# Patient Record
Sex: Female | Born: 1945
Health system: Southern US, Community
[De-identification: ages and names within clinical notes are randomized; demographics above are authoritative.]

## PROBLEM LIST (undated history)

## (undated) DIAGNOSIS — E559 Vitamin D deficiency, unspecified: Secondary | ICD-10-CM

## (undated) DIAGNOSIS — I10 Essential (primary) hypertension: Secondary | ICD-10-CM

## (undated) DIAGNOSIS — M1990 Unspecified osteoarthritis, unspecified site: Secondary | ICD-10-CM

## (undated) DIAGNOSIS — G20 Parkinson's disease: Secondary | ICD-10-CM

## (undated) DIAGNOSIS — F419 Anxiety disorder, unspecified: Secondary | ICD-10-CM

## (undated) DIAGNOSIS — D649 Anemia, unspecified: Secondary | ICD-10-CM

## (undated) DIAGNOSIS — I82403 Acute embolism and thrombosis of unspecified deep veins of lower extremity, bilateral: Secondary | ICD-10-CM

## (undated) DIAGNOSIS — G20A1 Parkinson's disease without dyskinesia, without mention of fluctuations: Secondary | ICD-10-CM

## (undated) HISTORY — PX: REFRACTIVE SURGERY: SHX103

## (undated) HISTORY — DX: Anxiety disorder, unspecified: F41.9

## (undated) HISTORY — PX: DILATION AND CURETTAGE OF UTERUS: SHX78

## (undated) HISTORY — PX: BREAST BIOPSY: SHX20

## (undated) HISTORY — DX: Vitamin D deficiency, unspecified: E55.9

## (undated) HISTORY — PX: OTHER SURGICAL HISTORY: SHX169

## (undated) HISTORY — DX: Parkinson's disease without dyskinesia, without mention of fluctuations: G20.A1

## (undated) HISTORY — DX: Parkinson's disease: G20

## (undated) HISTORY — DX: Essential (primary) hypertension: I10

---

## 2006-01-23 ENCOUNTER — Ambulatory Visit: Payer: Self-pay | Admitting: Family Medicine

## 2006-02-20 ENCOUNTER — Ambulatory Visit: Payer: Self-pay | Admitting: Family Medicine

## 2006-05-31 ENCOUNTER — Ambulatory Visit: Payer: Self-pay | Admitting: Family Medicine

## 2007-01-28 ENCOUNTER — Telehealth (INDEPENDENT_AMBULATORY_CARE_PROVIDER_SITE_OTHER): Payer: Self-pay | Admitting: *Deleted

## 2007-01-31 ENCOUNTER — Ambulatory Visit: Payer: Self-pay | Admitting: Family Medicine

## 2007-01-31 DIAGNOSIS — I10 Essential (primary) hypertension: Secondary | ICD-10-CM

## 2007-01-31 DIAGNOSIS — F410 Panic disorder [episodic paroxysmal anxiety] without agoraphobia: Secondary | ICD-10-CM

## 2007-01-31 DIAGNOSIS — F411 Generalized anxiety disorder: Secondary | ICD-10-CM | POA: Insufficient documentation

## 2007-01-31 HISTORY — DX: Panic disorder (episodic paroxysmal anxiety): F41.0

## 2007-01-31 HISTORY — DX: Essential (primary) hypertension: I10

## 2007-02-20 ENCOUNTER — Ambulatory Visit: Payer: Self-pay | Admitting: Family Medicine

## 2007-04-09 ENCOUNTER — Ambulatory Visit: Payer: Self-pay | Admitting: Gastroenterology

## 2008-03-04 ENCOUNTER — Ambulatory Visit: Payer: Self-pay | Admitting: Family Medicine

## 2008-04-07 ENCOUNTER — Ambulatory Visit: Payer: Self-pay | Admitting: Family Medicine

## 2008-04-07 DIAGNOSIS — R259 Unspecified abnormal involuntary movements: Secondary | ICD-10-CM | POA: Insufficient documentation

## 2008-04-09 ENCOUNTER — Encounter (INDEPENDENT_AMBULATORY_CARE_PROVIDER_SITE_OTHER): Payer: Self-pay | Admitting: *Deleted

## 2008-04-09 LAB — CONVERTED CEMR LAB
ALT: 18 units/L (ref 0–35)
Basophils Absolute: 0.1 10*3/uL (ref 0.0–0.1)
Basophils Relative: 1.8 % (ref 0.0–3.0)
CO2: 31 meq/L (ref 19–32)
Calcium: 9.6 mg/dL (ref 8.4–10.5)
Cholesterol: 188 mg/dL (ref 0–200)
Creatinine, Ser: 0.9 mg/dL (ref 0.4–1.2)
GFR calc Af Amer: 82 mL/min
Glucose, Bld: 106 mg/dL — ABNORMAL HIGH (ref 70–99)
HCT: 38 % (ref 36.0–46.0)
Hemoglobin: 13.1 g/dL (ref 12.0–15.0)
LDL Cholesterol: 116 mg/dL — ABNORMAL HIGH (ref 0–99)
Lymphocytes Relative: 20.3 % (ref 12.0–46.0)
MCHC: 34.4 g/dL (ref 30.0–36.0)
Monocytes Absolute: 0.3 10*3/uL (ref 0.1–1.0)
Monocytes Relative: 5.5 % (ref 3.0–12.0)
Neutro Abs: 3.6 10*3/uL (ref 1.4–7.7)
RBC: 4.19 M/uL (ref 3.87–5.11)
TSH: 1.27 microintl units/mL (ref 0.35–5.50)
Total Protein: 7.3 g/dL (ref 6.0–8.3)
Triglycerides: 80 mg/dL (ref 0–149)

## 2008-05-12 ENCOUNTER — Encounter: Payer: Self-pay | Admitting: Internal Medicine

## 2008-06-18 ENCOUNTER — Encounter: Payer: Self-pay | Admitting: Family Medicine

## 2008-07-08 ENCOUNTER — Ambulatory Visit: Payer: Self-pay | Admitting: Family Medicine

## 2008-07-08 DIAGNOSIS — G20 Parkinson's disease: Secondary | ICD-10-CM | POA: Insufficient documentation

## 2008-07-13 ENCOUNTER — Encounter (INDEPENDENT_AMBULATORY_CARE_PROVIDER_SITE_OTHER): Payer: Self-pay | Admitting: *Deleted

## 2008-07-21 ENCOUNTER — Encounter: Payer: Self-pay | Admitting: Family Medicine

## 2008-07-23 ENCOUNTER — Telehealth (INDEPENDENT_AMBULATORY_CARE_PROVIDER_SITE_OTHER): Payer: Self-pay | Admitting: *Deleted

## 2008-09-23 ENCOUNTER — Ambulatory Visit: Payer: Self-pay | Admitting: Family Medicine

## 2008-12-24 ENCOUNTER — Ambulatory Visit: Payer: Self-pay | Admitting: Family Medicine

## 2009-12-30 ENCOUNTER — Ambulatory Visit: Payer: Self-pay | Admitting: Family Medicine

## 2009-12-30 ENCOUNTER — Encounter: Payer: Self-pay | Admitting: Family Medicine

## 2010-01-03 ENCOUNTER — Ambulatory Visit: Payer: Self-pay | Admitting: Family Medicine

## 2010-01-03 LAB — CONVERTED CEMR LAB
Albumin: 4 g/dL (ref 3.5–5.2)
Alkaline Phosphatase: 78 units/L (ref 39–117)
Basophils Absolute: 0 10*3/uL (ref 0.0–0.1)
Bilirubin, Direct: 0.1 mg/dL (ref 0.0–0.3)
Calcium: 9.2 mg/dL (ref 8.4–10.5)
GFR calc non Af Amer: 53.46 mL/min (ref >60)
HCT: 37.1 % (ref 36.0–46.0)
HDL: 64 mg/dL (ref >39.00)
LDL Cholesterol: 111 mg/dL — ABNORMAL HIGH (ref 0–99)
Lymphs Abs: 1.4 10*3/uL (ref 0.7–4.0)
Monocytes Relative: 7.3 % (ref 3.0–12.0)
Platelets: 152 10*3/uL (ref 150.0–400.0)
RDW: 13.3 % (ref 11.5–14.6)
Sodium: 138 meq/L (ref 135–145)
TSH: 1.29 microintl units/mL (ref 0.35–5.50)
Total Bilirubin: 0.7 mg/dL (ref 0.3–1.2)
Total CHOL/HDL Ratio: 3
VLDL: 12 mg/dL (ref 0.0–40.0)

## 2010-01-31 ENCOUNTER — Ambulatory Visit: Payer: Self-pay | Admitting: Family Medicine

## 2010-01-31 DIAGNOSIS — M7660 Achilles tendinitis, unspecified leg: Secondary | ICD-10-CM | POA: Insufficient documentation

## 2010-05-05 NOTE — Assessment & Plan Note (Signed)
Summary: CPX///SPH   Vital Signs:  Patient profile:   65 year old female Height:      66 inches (167.64 cm) Weight:      191 pounds (86.82 kg) BMI:     30.94 Temp:     97.6 degrees F (36.44 degrees C) oral BP sitting:   114 / 62  (left arm) Cuff size:   regular  Vitals Entered By: Lucious Groves CMA (December 30, 2009 3:41 PM) CC: CPX--no pap./kb Is Patient Diabetic? No Pain Assessment Patient in pain? no      Comments Patient notes that she is not taking Naproxen, Mirapex 0.125, or Selegiline./kb   Current Medications (verified): 1)  Paxil Cr 12.5 Mg Tb24 (Paroxetine Hcl) .Marland Kitchen.. 1 By Mouth Once Daily*office Visit Due Now ** 2)  Alprazolam 0.25 Mg Tabs (Alprazolam) .... Take One Tablet Two Times A Day As Needed For Anxiety 3)  Aspirin 81 Mg Tbec (Aspirin) .... Take One Tablet Daily 4)  Mirapex 0.5 Mg Tabs (Pramipexole Dihydrochloride) .... 2 By Mouth Tid  Allergies (verified): 1)  ! Codeine   Preventive Screening-Counseling & Management  Alcohol-Tobacco     Alcohol drinks/day: <1     Smoking Status: never  Caffeine-Diet-Exercise     Does Patient Exercise: yes      Drug Use:  never.    History of Present Illness: 65 yo woman here for CPE.  since last visit, pt's brother passed, sister had a stroke recently.  knows she needs an anti-depressant but would prefer to change meds due to wt gain.  has gained 40 lbs since christmas.  currently taking a pill every other day.  parkinson's meds can also cause wt gain but 'i need those'.  still walking daily and doing yoga.  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  (forgot form)  health maintainence- has appt upcoming for pap and mammogram.  going to schedule appt at [facility] for colonoscopy.   Past History:  Past Medical History: Last updated: 07/08/2008 Anxiety Parkinson's Dx'd 2/10  Past Surgical History: Last updated: 04/07/2008 none reported  Family History: Last  updated: 12/30/2009 MI at 73: father brother with bladder cancer dx'd 2010 brother w/ brain cancer dx'd 2010 sister w/ CVA  Social History: Last updated: 12/30/2009 Retired Never Smoked Alcohol use-yes Drug use-no Regular exercise-yes, walking, yoga, Systems analyst for parkinson's  Family History: MI at 36: father brother with bladder cancer dx'd 2010 brother w/ brain cancer dx'd 2010 sister w/ CVA  Social History: Retired Never Smoked Alcohol use-yes Drug use-no Regular exercise-yes, walking, yoga, Systems analyst for parkinson's Drug Use:  never  Review of Systems       The patient complains of weight gain and depression.  The patient denies anorexia, fever, weight loss, vision loss, decreased hearing, hoarseness, chest pain, syncope, dyspnea on exertion, peripheral edema, prolonged cough, headaches, abdominal pain, melena, hematochezia, severe indigestion/heartburn, hematuria, suspicious skin lesions, abnormal bleeding, enlarged lymph nodes, and breast masses.    Physical Exam  General:  Well-developed,well-nourished,in no acute distress; alert,appropriate and cooperative throughout examination Head:  Normocephalic and atraumatic without obvious abnormalities. No apparent alopecia or balding. Eyes:  No corneal or conjunctival inflammation noted. EOMI. Perrla. Funduscopic exam benign, without hemorrhages, exudates or papilledema. Vision grossly normal. Ears:  External ear exam shows no significant lesions or deformities.  Otoscopic examination reveals clear canals, tympanic membranes are intact bilaterally without bulging, retraction, inflammation or discharge. Hearing is grossly normal bilaterally. Nose:  External nasal examination shows no deformity or inflammation. Nasal mucosa are pink and moist without lesions or exudates. Mouth:  Oral mucosa and oropharynx without lesions or exudates.  Teeth in good repair. Neck:  No deformities, masses, or tenderness  noted. Breasts:  deferred to gyn Lungs:  Normal respiratory effort, chest expands symmetrically. Lungs are clear to auscultation, no crackles or wheezes. Heart:  Normal rate and regular rhythm. S1 and S2 normal without gallop, murmur, click, rub or other extra sounds. Abdomen:  Bowel sounds positive,abdomen soft and non-tender without masses, organomegaly or hernias noted. Genitalia:  deferred to gyn Pulses:  +2 carotid, radial, DP Extremities:  no C/C/E Neurologic:  No cranial nerve deficits noted. Station and gait are normal. Plantar reflexes are down-going bilaterally. DTRs are brisk but symmetrical throughout.  + resting tremor of R hand- almost pill rolling in nature.  muscle strength 5/5 throughout w/ exception of 4/5 bicep strength on R.  + cogwheeling, some rigidity Skin:  Intact without suspicious lesions or rashes Cervical Nodes:  No lymphadenopathy noted Axillary Nodes:  No palpable lymphadenopathy Psych:  Cognition and judgment appear intact. Alert and cooperative with normal attention span and concentration. No apparent delusions, illusions, hallucinations   Impression & Recommendations:  Problem # 1:  HEALTHY ADULT FEMALE (ICD-V70.0) Assessment Unchanged  pt's PE WNL w/ exception of parkinson's sxs.  has plans for colonoscopy and pap/mammo.  needs to return for fasting labs.  anticipatory guidance provided on healthy diet and regular exercise..  Orders: EKG w/ Interpretation (93000)  Problem # 2:  ANXIETY (ICD-300.00) Assessment: Unchanged given pt's recent wt gain and desire to switch paxil will start Celexa.  will follow mood closely since switching med. Her updated medication list for this problem includes:    Citalopram Hydrobromide 20 Mg Tabs (Citalopram hydrobromide) .Marland Kitchen... Take one tablet by mouth daily    Alprazolam 0.25 Mg Tabs (Alprazolam) .Marland Kitchen... Take one tablet two times a day as needed for anxiety  Complete Medication List: 1)  Citalopram Hydrobromide 20 Mg  Tabs (Citalopram hydrobromide) .... Take one tablet by mouth daily 2)  Alprazolam 0.25 Mg Tabs (Alprazolam) .... Take one tablet two times a day as needed for anxiety 3)  Aspirin 81 Mg Tbec (Aspirin) .... Take one tablet daily 4)  Mirapex 0.5 Mg Tabs (Pramipexole dihydrochloride) .... 2 by mouth tid  Other Orders: Tdap => 42yrs IM [phone]) Admin 1st Vaccine (99833) Admin 1st Vaccine (82505) Flu Vaccine 47yrs + [phone])  Immunizations Administered:  Tetanus Vaccine:    Vaccine Type: Tdap    Site: left deltoid    Mfr: [employer]Smith[employer]    Dose: 0.5 ml    Route: IM    Given by: Lucious Groves CMA    Exp. Date: 01/21/2012    Lot #: [id number]    VIS given: 02/19/08 version given December 30, 2009.  Patient Instructions: 1)  Please schedule a follow-up appointment in 1 month to follow up med switch. 2)  Schedule a lab visit at your convenience- do not eat before this appt 3)   BMP prior to visit, ICD-9: V70 4)  Hepatic Panel prior to visit ICD-9: V70 5)  Lipid panel prior to visit ICD-9 : V70 6)  TSH prior to visit ICD-9 : V70 7)  CBC w/ Diff prior to visit ICD-9 : V70 8)  Keep up the good work on your exercise 9)  Bring the form for the personal trainer 10)  We'll notify you of your lab results 11)  STOP  the Paxil.  START the Citalopram daily- if it causes sleepiness, take it at night 12)  Call with any questions or concerns 13)  You look great! Prescriptions: ALPRAZOLAM 0.25 MG TABS (ALPRAZOLAM) take one tablet two times a day as needed for anxiety  #60 x 3   Entered and Authorized by:   [name] MD   Signed by:   [name] MD on 12/30/2009   Method used:   Print then Give to Patient   RxID:   [id number] CITALOPRAM HYDROBROMIDE 20 MG TABS (CITALOPRAM HYDROBROMIDE) take one tablet by mouth daily  #30 x 3   Entered and Authorized by:   [name] MD   Signed by:   [name] MD on 12/30/2009   Method used:   Electronically to        [facility] (retail)       [address]       [facility]       [place], [place]  [address]       Ph: [phone]       Fax: [phone]   RxID:   [id number]  ]    Flu Vaccine Consent Questions     Do you have a history of severe allergic reactions to this vaccine? no    Any prior history of allergic reactions to egg and/or gelatin? no    Do you have a sensitivity to the preservative Thimersol? no    Do you have a past history of Guillan-Barre Syndrome? no    Do you currently have an acute febrile illness? no    Have you ever had a severe reaction to latex? no    Vaccine information given and explained to patient? yes    Are you currently pregnant? no    Lot Number:[id number]   Exp Date:10/01/2010   Site Given  Right Deltoid IMlbflu

## 2010-05-05 NOTE — Assessment & Plan Note (Signed)
Summary: ACHILLES TENDONITIS & SWELLING RT ANKLE/NP/LP   Vital Signs:  Patient profile:   65 year old female Temp:     97.4 degrees F Pulse rate:   58 / minute BP sitting:   194 / 76  History of Present Illness: 65 yo F here for right achilles pain  Patient reports pain within right achilles for the past month She typically walks around her neighborhood for exercise about 3 miles a day About 1 month ago she slipped off a little step and felt a pull within her right achilles Some swelling but no bruising. Has had full range of motion of right ankle. Some better but still with pain here limiting her activities. Worse after stairs and first thing in the morning Has been icing the area. Trying to stay active and is meeting with physical therapist/personal trainer for parkinsons exercise tomorrow.   Problems Prior to Update: 1)  Achilles Tendinitis  (ICD-726.71) 2)  Need Prophylactic Vaccination&inoculation Flu  (ICD-V04.81) 3)  Parkinson's Disease  (ICD-332.0) 4)  Special Screening For Malignant Neoplasms Colon  (ICD-V76.51) 5)  Tremor, Right Hand  (ICD-781.0) 6)  Healthy Adult Female  (ICD-V70.0) 7)  Elevated Blood Pressure Without Diagnosis of Hypertension  (ICD-796.2) 8)  Anxiety  (ICD-300.00)  Medications Prior to Update: 1)  Citalopram Hydrobromide 20 Mg Tabs (Citalopram Hydrobromide) .... Take One Tablet By Mouth Daily 2)  Alprazolam 0.25 Mg Tabs (Alprazolam) .... Take One Tablet Two Times A Day As Needed For Anxiety 3)  Aspirin 81 Mg Tbec (Aspirin) .... Take One Tablet Daily 4)  Mirapex Er 1.5 Mg Xr24h-Tab (Pramipexole Dihydrochloride) .... 2 Tabs Daily- Per Neuro  Allergies (verified): 1)  ! Codeine  Physical Exam  General:  Well-developed,well-nourished,in no acute distress; alert,appropriate and cooperative throughout examination Msk:  R foot/ankle: Mild swelling, thicker AT compared to left side. TTP 2 cm proximal to achilles insertion on calcaneus No bruising  or other deformity. FROM of ankle. Negative thompson's test. Able to stand on toes with right foot only. NVI distally   Impression & Recommendations:  Problem # 1:  ACHILLES TENDINITIS (ICD-726.71) Assessment Deteriorated History and exam consistent with achilles strain/tendinopathy but tendon is intact.  Shown home exercise program - specifically important she focus on step exercise demonstrated and do this every day.  ice bucket or wrap 15 minutes at a time 3-4 times a day especially at end of day.  Given comforthotics with heel lift.  Also provided with another pair of heel lifts to put in dress shoes.  Try to avoid uneven ground, hills, and consider wearing tennis shoes with the insert when walking around house.  Does not need boot or immobilization at this time - did tell her to let her therapist/personal trainer know about this injury so she can exercise other muscle groups while she specifically rehabs her AT on this side (not sure the extent to which this person is trained in PT but advised her if he/she needs an order to do rehab on this, would be happy to order this in addition to what she is already going to be doing).  Provided with a sample of voltaren gel to try 3-4 times a day as needed also - will call in a prescription if she finds this helpful.  Nitro patches an option if not improving after 6 weeks of therapy.  Orders: Sports Insoles [phone])  Complete Medication List: 1)  Citalopram Hydrobromide 20 Mg Tabs (Citalopram hydrobromide) .... Take one tablet by mouth daily 2)  Alprazolam 0.25 Mg Tabs (Alprazolam) .... Take one tablet two times a day as needed for anxiety 3)  Aspirin 81 Mg Tbec (Aspirin) .... Take one tablet daily 4)  Mirapex Er 1.5 Mg Xr24h-tab (Pramipexole dihydrochloride) .... 2 tabs daily- per neuro  Patient Instructions: 1)  You have achilles tendinopathy (inflammation/degeneration of your achilles) which is causing your pain. 2)  Try tylenol or aleve as  needed for pain 3)  Lowering/raise on a step exercises 3 x 15 once or twice a day - two feet first then one (start with 3 x 6 if necessary) 4)  Can add heel walks, toe walks forward and backward as well 5)  Ice bucket 10-15 minutes at end of day - can ice 3-4 times a day. 6)  Avoid uneven ground, hills. 7)  Avoid barefoot walking and sandals when possible. 8)  Heel lifts will help unload the tendon while it heals. 9)  Use the orthotics with heel lift in your tennis shoes (take the insole out of it that is already in there. 10)  Custom orthotics may be helpful if you get benefit with these temporary ones. 11)  Physical therapy is an option but usually you can do these exercises all by yourself. 12)  Let your therapist know you have an achilles problem when you go for your parkinsons rehab (they may be able to help rehab this problem while you are there too!). 13)  Follow up with me in 6 weeks for a recheck.   Orders Added: 1)  New Patient Level III [99203] 2)  Sports Insoles [L3510]  Appended Document: ACHILLES TENDONITIS & SWELLING RT ANKLE/NP/LP patient given a sample of voltaren gel on 10/31 1 box of lot number [id number], exp date 08/2012

## 2010-05-05 NOTE — Assessment & Plan Note (Signed)
Summary: ONE MONTH OV//PH   Vital Signs:  Patient profile:   65 year old female Weight:      191 pounds Pulse rate:   70 / minute BP sitting:   122 / 78  (left arm)  Vitals Entered By: Doristine Devoid CMA (January 31, 2010 9:34 AM) CC: f/u on meds    History of Present Illness: 65 yo woman here today for f/u on recent Celexa start.  has been taking the Citalopram at night.  feels anxiety has improved since stopping the paxil.  R achilles pain- started 3 weeks ago, pain w/ walking, swollen.  still trying to exercise despite pain b/c she knows it's important for her Parkinson's tx.  Current Medications (verified): 1)  Citalopram Hydrobromide 20 Mg Tabs (Citalopram Hydrobromide) .... Take One Tablet By Mouth Daily 2)  Alprazolam 0.25 Mg Tabs (Alprazolam) .... Take One Tablet Two Times A Day As Needed For Anxiety 3)  Aspirin 81 Mg Tbec (Aspirin) .... Take One Tablet Daily 4)  Mirapex Er 1.5 Mg Xr24h-Tab (Pramipexole Dihydrochloride) .... 2 Tabs Daily- Per Neuro  Allergies (verified): 1)  ! Codeine  Review of Systems      See HPI  Physical Exam  General:  Well-developed,well-nourished,in no acute distress; alert,appropriate and cooperative throughout examination Msk:  R achilles swollen, painful.  good strength w/ dorsiflexion Pulses:  +2 DP/PT   Impression & Recommendations:  Problem # 1:  ANXIETY (ICD-300.00) Assessment Improved pt feels sxs are improved since switching med. Her updated medication list for this problem includes:    Citalopram Hydrobromide 20 Mg Tabs (Citalopram hydrobromide) .Marland Kitchen... Take one tablet by mouth daily    Alprazolam 0.25 Mg Tabs (Alprazolam) .Marland Kitchen... Take one tablet two times a day as needed for anxiety  Problem # 2:  ACHILLES TENDINITIS (ICD-726.71) Assessment: New given amount of pain and swelling would like sports med to see pt to eval for partial tear.  part of pt's Parkinson's tx is regular exercise and she is having a very difficult time doing  this w/ her current pain.  may need to temporarily stop exercising to allow healing but will defer to sports med Orders: Sports Medicine (Sports Med)  Complete Medication List: 1)  Citalopram Hydrobromide 20 Mg Tabs (Citalopram hydrobromide) .... Take one tablet by mouth daily 2)  Alprazolam 0.25 Mg Tabs (Alprazolam) .... Take one tablet two times a day as needed for anxiety 3)  Aspirin 81 Mg Tbec (Aspirin) .... Take one tablet daily 4)  Mirapex Er 1.5 Mg Xr24h-tab (Pramipexole dihydrochloride) .... 2 tabs daily- per neuro   Orders Added: 1)  Sports Medicine [Sports Med] 2)  Est. Patient Level III [16109]    History of Present Illness: 65 yo woman here today for f/u on recent Celexa start.  has been taking the Citalopram at night.  feels anxiety has improved since stopping the paxil.  R achilles pain- started 3 weeks ago, pain w/ walking, swollen.  still trying to exercise despite pain b/c she knows it's important for her Parkinson's tx.

## 2010-06-13 ENCOUNTER — Encounter: Payer: Self-pay | Admitting: *Deleted

## 2010-08-19 NOTE — Assessment & Plan Note (Signed)
[facility]                                 ON-CALL NOTE   [name], Carly                    MRN:          [id number]  DATE:05/26/2006                            DOB:          October 22, 1945    The phone call came from Demetrios Loll, who did not say who he was; with her  probably a significant other.  Phone number [phone].  Phone call was at  7:30 a.m. on May 26, 2006.   Carly [name] has come down with a bit of a sore throat and she feels  a little bit achy, has a temperature to 99.5 and Mr. Edwyna Shell called asking  for a prescription for her sore throat.   PLAN:  I explained that we did not give prescriptions over the phone,  that most sore throats were viral.  It did not sound like she had flu  symptoms, was not having shortness of breath.  I offered her an  appointment and he declined.  I did discuss supportive treatment with  analgesics and told him to call back if she changed her mind and wanted  to be seen.     Karie Schwalbe, MD  Electronically Signed    RIL/MedQ  DD: 05/26/2006  DT: 05/26/2006  Job #: [phone] [id number]   cc:   Leanne Chang, M.D.

## 2010-08-22 ENCOUNTER — Other Ambulatory Visit: Payer: Self-pay | Admitting: Family Medicine

## 2010-08-22 NOTE — Telephone Encounter (Signed)
Per [facility] pt is UTD, sent refill.

## 2011-04-12 ENCOUNTER — Encounter: Payer: Self-pay | Admitting: Family Medicine

## 2011-04-12 ENCOUNTER — Ambulatory Visit (INDEPENDENT_AMBULATORY_CARE_PROVIDER_SITE_OTHER): Payer: Self-pay | Admitting: Family Medicine

## 2011-04-12 VITALS — BP 130/75 | HR 66 | Temp 97.9°F | Ht 66.0 in | Wt 190.0 lb

## 2011-04-12 DIAGNOSIS — J40 Bronchitis, not specified as acute or chronic: Secondary | ICD-10-CM | POA: Insufficient documentation

## 2011-04-12 HISTORY — DX: Bronchitis, not specified as acute or chronic: J40

## 2011-04-12 MED ORDER — AMOXICILLIN 500 MG PO CAPS: 500.0000 mg | ORAL_CAPSULE | Freq: Two times a day (BID) | ORAL | Status: AC

## 2011-04-12 MED ORDER — ALPRAZOLAM 0.25 MG PO TABS: 0.2500 mg | ORAL_TABLET | Freq: Two times a day (BID) | ORAL | Status: DC | PRN

## 2011-04-12 MED ORDER — BENZONATATE 200 MG PO CAPS: 200.0000 mg | ORAL_CAPSULE | Freq: Three times a day (TID) | ORAL | Status: AC | PRN

## 2011-04-12 NOTE — Assessment & Plan Note (Signed)
Pt's sxs and PE consistent w/ infxn.  Start abx.  Cough meds prn.  Reviewed supportive care and red flags that should prompt return.  Pt expressed understanding and is in agreement w/ plan.  

## 2011-04-12 NOTE — Patient Instructions (Signed)
This is a bronchitis Start the Amoxicillin- take w/ food Add Mucinex Use the cough pills as needed Drink plenty of fluids REST! Hang in there! Happy New Year!

## 2011-04-12 NOTE — Progress Notes (Signed)
  Subjective:    Patient ID: Carly [name], female    DOB: 09-26-1945, 66 y.o.   MRN: [id number]  HPI Cough- sxs started 9 days ago.  Productive, 'almost choking'.  + nasal congestion, sinus pressure.  No ear pain, fever.  + sick contacts.   Review of Systems For ROS see HPI     Objective:   Physical Exam  Vitals reviewed. Constitutional: She appears well-developed and well-nourished. No distress.  HENT:  Head: Normocephalic and atraumatic.       TMs normal bilaterally Mild nasal congestion Throat w/out erythema, edema, or exudate  Eyes: Conjunctivae and EOM are normal. Pupils are equal, round, and reactive to light.  Neck: Normal range of motion. Neck supple.  Cardiovascular: Normal rate, regular rhythm, normal heart sounds and intact distal pulses.   No murmur heard. Pulmonary/Chest: Effort normal and breath sounds normal. No respiratory distress. She has no wheezes.       + hacking cough  Lymphadenopathy:    She has no cervical adenopathy.          Assessment & Plan:

## 2012-05-10 ENCOUNTER — Encounter: Payer: Self-pay | Admitting: Family Medicine

## 2012-05-10 ENCOUNTER — Ambulatory Visit (INDEPENDENT_AMBULATORY_CARE_PROVIDER_SITE_OTHER): Payer: BC Managed Care – PPO | Admitting: Family Medicine

## 2012-05-10 VITALS — BP 150/90 | HR 67 | Temp 97.9°F | Ht 64.25 in | Wt 186.2 lb

## 2012-05-10 DIAGNOSIS — R030 Elevated blood-pressure reading, without diagnosis of hypertension: Secondary | ICD-10-CM

## 2012-05-10 DIAGNOSIS — F411 Generalized anxiety disorder: Secondary | ICD-10-CM

## 2012-05-10 MED ORDER — ALPRAZOLAM 0.25 MG PO TABS: 0.2500 mg | ORAL_TABLET | Freq: Three times a day (TID) | ORAL | Status: DC | PRN

## 2012-05-10 MED ORDER — CITALOPRAM HYDROBROMIDE 10 MG PO TABS: 10.0000 mg | ORAL_TABLET | Freq: Every day | ORAL | Status: DC

## 2012-05-10 NOTE — Patient Instructions (Addendum)
Follow up in 3-4 weeks to recheck mood and BP Start the Xanax as needed for panic Start the Celexa daily for anxiety Continue to walk- this is a good outlet Call with any questions or concerns Hang in there!!!

## 2012-05-10 NOTE — Progress Notes (Signed)
  Subjective:    Patient ID: Carly [name], female    DOB: [birth year], 67 y.o.   MRN: [id number]  HPI Anxiety- mother passed away, brother died of brain cancer, other brother is dying of bladder cancer, sister had CVA, other sister having heart problems.  Best friends son is dying of thyroid cancer.  Pt reports having panic attacks w/ palpitations, SOB.  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  Pt will start shaking when talking about stressors.  Pt has not been using xanax.  Difficulty sleeping.  Has hx of similar.  Has never been on daily controller med.  Elevated BP- pt is shocked at reading today.  Denies HAs, CP, SOB, visual changes, edema.  Feels that it is her nerves that has her BP so high.   Review of Systems For ROS see HPI     Objective:   Physical Exam  Vitals reviewed. Constitutional: She is oriented to person, place, and time. She appears well-developed and well-nourished. No distress.  HENT:  Head: Normocephalic and atraumatic.  Eyes: Conjunctivae normal and EOM are normal. Pupils are equal, round, and reactive to light.  Neck: Normal range of motion. Neck supple. No thyromegaly present.  Cardiovascular: Normal rate, regular rhythm, normal heart sounds and intact distal pulses.   No murmur heard. Pulmonary/Chest: Effort normal and breath sounds normal. No respiratory distress.  Musculoskeletal: She exhibits no edema.  Lymphadenopathy:    She has no cervical adenopathy.  Neurological: She is alert and oriented to person, place, and time.  Skin: Skin is warm and dry.  Psychiatric: Her behavior is normal.       Anxious, tearful          Assessment & Plan:

## 2012-05-10 NOTE — Assessment & Plan Note (Signed)
Deteriorated.  Restart xanax for panicked moments.  Start low dose daily controller med.  Will follow closely.

## 2012-05-10 NOTE — Assessment & Plan Note (Signed)
Deteriorated.  BP very high today but asymptomatic.  It did come down at end of visit.  Will not start meds but will follow closely.  Suspect that as anxiety improves, so will BP.

## 2012-06-06 ENCOUNTER — Encounter: Payer: Self-pay | Admitting: Lab

## 2012-06-07 ENCOUNTER — Ambulatory Visit: Payer: BC Managed Care – PPO | Admitting: Family Medicine

## 2012-06-11 ENCOUNTER — Encounter: Payer: Self-pay | Admitting: *Deleted

## 2012-06-12 ENCOUNTER — Ambulatory Visit (INDEPENDENT_AMBULATORY_CARE_PROVIDER_SITE_OTHER): Payer: BC Managed Care – PPO | Admitting: Family Medicine

## 2012-06-12 ENCOUNTER — Encounter: Payer: Self-pay | Admitting: Family Medicine

## 2012-06-12 VITALS — BP 148/90 | HR 54 | Temp 97.7°F | Ht 64.25 in | Wt 187.0 lb

## 2012-06-12 DIAGNOSIS — F411 Generalized anxiety disorder: Secondary | ICD-10-CM

## 2012-06-12 DIAGNOSIS — R030 Elevated blood-pressure reading, without diagnosis of hypertension: Secondary | ICD-10-CM

## 2012-06-12 LAB — BASIC METABOLIC PANEL
CO2: 27 mEq/L (ref 19–32)
Calcium: 9.1 mg/dL (ref 8.4–10.5)
Chloride: 106 mEq/L (ref 96–112)
Glucose, Bld: 98 mg/dL (ref 70–99)
Sodium: 140 mEq/L (ref 135–145)

## 2012-06-12 MED ORDER — HYDROCHLOROTHIAZIDE 12.5 MG PO TABS: 12.5000 mg | ORAL_TABLET | Freq: Every day | ORAL | Status: DC

## 2012-06-12 NOTE — Patient Instructions (Addendum)
Follow up in 3-4 weeks Start the HCTZ daily for BP Try and relax! Start the Celexa at night (this will help w/ the fatigue) Call with any questions or concerns Hang in there!

## 2012-06-12 NOTE — Progress Notes (Signed)
  Subjective:    Patient ID: Carly [name], female    DOB: 1945/12/05, 67 y.o.   MRN: [id number]  HPI HTN- BP is higher than previous.  Pt has been highly stressed- lost power x5 days, has been staying in hotels.  Can tell when BP is up b/c 'the ringing in my ears gets louder'.  Was late today b/c of leak in roof.    Anxiety- was started on Celexa at last visit but only took 1 dose b/c she had recently started Mirapex for Parkinson's and she felt light headed, sleepy.   Review of Systems For ROS see HPI     Objective:   Physical Exam  Vitals reviewed. Constitutional: She is oriented to person, place, and time. She appears well-developed and well-nourished. No distress.  HENT:  Head: Normocephalic and atraumatic.  Eyes: Conjunctivae and EOM are normal. Pupils are equal, round, and reactive to light.  Neck: Normal range of motion. Neck supple. No thyromegaly present.  Cardiovascular: Normal rate, regular rhythm, normal heart sounds and intact distal pulses.   No murmur heard. Pulmonary/Chest: Effort normal and breath sounds normal. No respiratory distress.  Abdominal: Soft. She exhibits no distension. There is no tenderness.  Musculoskeletal: She exhibits no edema.  Lymphadenopathy:    She has no cervical adenopathy.  Neurological: She is alert and oriented to person, place, and time.  Skin: Skin is warm and dry.  Psychiatric: She has a normal mood and affect. Her behavior is normal.          Assessment & Plan:

## 2012-06-16 NOTE — Assessment & Plan Note (Signed)
New.  Pt's BP remains high.  Again suspect this is due to untreated anxiety but unwilling to let BP elevation continue.  Start low dose HCTZ.  Check BMP today as baseline and repeat in 1 month.  Reviewed supportive care and red flags that should prompt return.  Pt expressed understanding and is in agreement w/ plan.

## 2012-06-16 NOTE — Assessment & Plan Note (Signed)
Unchanged.  Pt will attempt to take celexa at night due to fatigue.  Also plans to discuss sxs and fatigue w/ neuro at upcoming appt.  Will follow closely.

## 2012-06-17 ENCOUNTER — Encounter: Payer: Self-pay | Admitting: *Deleted

## 2012-07-08 ENCOUNTER — Ambulatory Visit: Payer: BC Managed Care – PPO | Admitting: Family Medicine

## 2012-07-09 ENCOUNTER — Encounter: Payer: Self-pay | Admitting: Family Medicine

## 2012-07-15 ENCOUNTER — Ambulatory Visit: Payer: BC Managed Care – PPO | Admitting: Family Medicine

## 2012-08-14 ENCOUNTER — Telehealth: Payer: Self-pay | Admitting: Neurology

## 2012-10-11 ENCOUNTER — Other Ambulatory Visit: Payer: Self-pay | Admitting: Family Medicine

## 2012-10-16 ENCOUNTER — Ambulatory Visit: Payer: Self-pay | Admitting: Neurology

## 2012-10-30 ENCOUNTER — Ambulatory Visit (INDEPENDENT_AMBULATORY_CARE_PROVIDER_SITE_OTHER): Payer: Medicare Other | Admitting: Neurology

## 2012-10-30 ENCOUNTER — Encounter: Payer: Self-pay | Admitting: Neurology

## 2012-10-30 VITALS — BP 149/64 | HR 66 | Ht 64.25 in | Wt 186.0 lb

## 2012-10-30 DIAGNOSIS — R2681 Unsteadiness on feet: Secondary | ICD-10-CM

## 2012-10-30 DIAGNOSIS — G4752 REM sleep behavior disorder: Secondary | ICD-10-CM

## 2012-10-30 DIAGNOSIS — K5900 Constipation, unspecified: Secondary | ICD-10-CM

## 2012-10-30 DIAGNOSIS — R269 Unspecified abnormalities of gait and mobility: Secondary | ICD-10-CM

## 2012-10-30 DIAGNOSIS — G20 Parkinson's disease: Secondary | ICD-10-CM

## 2012-10-30 MED ORDER — RASAGILINE MESYLATE 1 MG PO TABS: 1.0000 mg | ORAL_TABLET | Freq: Every day | ORAL | Status: DC

## 2012-10-30 MED ORDER — PRAMIPEXOLE DIHYDROCHLORIDE ER 3 MG PO TB24: 1.0000 | ORAL_TABLET | Freq: Every day | ORAL | Status: DC

## 2012-10-30 MED ORDER — CARBIDOPA-LEVODOPA ER 50-200 MG PO TBCR: 1.0000 | EXTENDED_RELEASE_TABLET | Freq: Every day | ORAL | Status: DC

## 2012-10-30 NOTE — Patient Instructions (Addendum)
[phone] Overall you are doing fairly well but I do want to suggest a few things today:   Remember to drink plenty of fluid, eat healthy meals and do not skip any meals. Try to eat protein with a every meal and eat a healthy snack such as fruit or nuts in between meals. Try to keep a regular sleep-wake schedule and try to exercise daily, particularly in the form of walking, 20-30 minutes a day, if you can.   As far as your medications are concerned, I would like to suggest the following: -continue the Azilect 1mg  daily -switch your Mirapex ER 3.0mg  to once daily in the morning -at bedtime, take a Sinemet CR 50-200 once nightly   -You are taking a dopamine agonist and, as with all medications, there can be potential side effects.  Call us if any of the following develop: hallucinations, worsening compulsive/impulsive behaviors (such as gambling, excessive money spending, excessive eating, hypersexuality), leg swelling, or sleep attacks (falling asleep without warning) -Please avoid the following medications while using Azilect: dextromethorphan (can be found in cough medicines), Luvox (fluvoxamine), Prozac (fluoxetine), Flexeril (cyclobenzaprine), Darvon (propoxyphene), St. John's Wort, Tramadol, Methadone, and Demerol (meperidine).  If you need to take ciprofloxacin for an infection, cut down the Azilect to  tab prior to starting the cipro  I think you will benefit from physical therapy, more specifically from a program called the BIG program. You should be able to do this at [facility] You can call their rehab. Department at [phone] to schedule this.   I would like to see you back in 4 months, sooner if we need to. Please call us with any interim questions, concerns, problems, updates or refill requests.   My clinical assistant and will answer any of your questions and relay your messages to me and also relay most of my messages to you.   Our phone number is [phone]. We also  have an after hours call service for urgent matters and there is a physician on-call for urgent questions. For any emergencies you know to call 911 or go to the nearest emergency room

## 2012-10-30 NOTE — Progress Notes (Addendum)
Provider:  Dr Hosie Poisson Referring Provider: Sheliah Hatch, MD Primary Care Physician:  [name], MD  No chief complaint on file.   HPI:  Carly [name] is a 67 y.o. female here as a follow up of her Parkinons disease.   Carly [name] is a pleasant 67 year old right-handed woman with a around 5 or history of right sided tremor bradykinesia diagnosed with Parkinson's disease. Overall reports she is doing quite well. Her main concern at this visit is some nocturnal cramping in her toes and feet. Is often will wake her up and require her to stretch and move her feet to get him to relax. Otherwise no acute issues at this time. She continues on Mirapex ER 30 mg daily, Azilect 1 mg daily and will occasionally take a Xanax 0.25 for anxiety. She is tolerating his medications well. She does note some mild impulse control behavior, described as excessive cleaning. She feels that she has always been like this though it may have worsened slightly in the past 2 years. She does not feel is a problem though. He does continue to have some swelling in her legs or reports this is stable. Denies any motor fluctuations throughout the day. She does have some mild RBD with sleep, and difficulty staying asleep throughout the night. Notes mild constipation, she  Hydrates well, and eats plenty of fiber. She continues to exercise on a regular basis, she notes that she walks 2 miles every day. She has recently noticed some increased gait instability, trouble going up and downstairs. She denies any trips falls or close calls. She also does note some intermittent blurriness of her vision that occurs sporadically throughout the day. She is scheduled to see her eye doctor in the future.    Quality of life/fun:  Per Dr Imagene Gurney prior note from 06/13/2012: 66y/o R handed woman hx of R hand/arm tremor, R shoulder pain, difficulty turning out R leg when walking, symptoms began 82yrs ago. Initially started on selegeline and then  added Mirapex 1mg  TID. Noticed weight gain and edema so stopped Selegeline (unclear why this was done). Noted RBD. Has constipation. Notes some compulsive behavior. At last visit was started on Klonopin .25mg  qhs for RBD and Azilect 1mg  daily.   Concerns/Questions:Review of Systems: Out of a complete 14 system review, the patient complains of only the following symptoms, and all other reviewed systems are negative. Positive for weight gain blurred vision swelling in legs ringing in his ears constipation moles tremor anxiety racing thoughts and restless leg  History   Social History  . Marital Status: Married    Spouse Name: N/A    Number of Children: N/A  . Years of Education: N/A   Occupational History  . Not on file.   Social History Main Topics  . Smoking status: Never Smoker   . Smokeless tobacco: Not on file  . Alcohol Use: No  . Drug Use: No  . Sexually Active: Not on file   Other Topics Concern  . Not on file   Social History Narrative  . No narrative on file    No family history on file.  Past Medical History  Diagnosis Date  . Parkinson's disease   . Anxiety   . Hypertension     No past surgical history on file.  Current Outpatient Prescriptions  Medication Sig Dispense Refill  . ALPRAZolam (XANAX) 0.25 MG tablet Take 1 tablet (0.25 mg total) by mouth 3 (three) times daily as needed. For anxiety  60  tablet  1  . aspirin EC 81 MG EC tablet Take 81 mg by mouth daily.        . citalopram (CELEXA) 10 MG tablet Take 1 tablet (10 mg total) by mouth daily.  30 tablet  3  . hydrochlorothiazide (MICROZIDE) 12.5 MG capsule take 1 capsule by mouth once daily  30 capsule  0  . Pramipexole Dihydrochloride (MIRAPEX ER) 3 MG TB24 Take 1 tablet by mouth daily.       No current facility-administered medications for this visit.    Allergies as of 10/30/2012 - Review Complete 06/12/2012  Allergen Reaction Noted  . Codeine  03/04/2008    Vitals: There were no vitals  taken for this visit. Last Weight:  Wt Readings from Last 1 Encounters:  06/12/12 187 lb (84.823 kg)   Last Height:   Ht Readings from Last 1 Encounters:  06/12/12 5' 4.25" (1.632 m)     Physical exam: Exam: Gen: NAD, conversant Eyes: anicteric sclerae, moist conjunctivae HENT: Atraumati Lungs: CTA, no wheezing, rales, rhonic                          CV: RRR, no MRG Abdomen: Soft, non-tender;  Extremities:mild 1+ bilat LE pitting edema Skin: Normal temperature, no rash,  Psych: Appropriate affect, pleasant  Neuro: Carly: AA&Ox3, appropriately interactive, normal affect   Attention: WORLD backwards  Speech: fluent w/o paraphasic error  Memory: good recent and remote recall  CN: PERRL, EOMI no nystagmus, minimal ptosis, sensation intact to LT V1-V3 bilat, face symmetric, no weakness,  shoulder shrug 5/5 bilat,  tongue protrudes midline, no fasiculations noted.  Motor: normal bulk and tone Strength: 5/5  In all extremities  Reflexes: symmetrical, bilat downgoing toes  Sens: LT intact in all extremities  Speech: wnl  Facial Expression: Mild masked facies  Tremor: Rest R1 L0 Action/postural R1 L0 Rigidity: RUE: 1 LUE: 1 Finger taps:   R:1 L:1  Open/close hands: R:1 L:0  Foot taps: R:1 L:1  Arising from Chair: 1 Gait/FOG: 1, mild stooped, slow, decreased arm swing on R with re-emergent tremor, retropulses but catches herself   Assessment:  After physical and neurologic examination, review of laboratory studies, imaging, neurophysiology testing and pre-existing records, assessment will be reviewed on the problem list.  Plan:  Treatment plan and additional workup will be reviewed under Problem List.  Carly. Vincente [name] is a pleasant woman with the diagnosis of Parkinson disease which is predominantly on the right side. Clinically she appears to be doing quite well with probably main concern being muscle cramps and spasms in her feet at nighttime. Her  physical exam is stable though she does appear to be slightly undermedicated.   Parkinson's disease - Plan: Pramipexole Dihydrochloride (MIRAPEX ER) 3 MG TB24, rasagiline (AZILECT) 1 MG TABS, Ambulatory referral to Physical Therapy, DISCONTINUED: rasagiline (AZILECT) 1 MG TABS  RBD (REM behavioral disorder)  Gait instability  Unspecified constipation  -will change Mirapex ER 3.0mg  to a daytime dose. Will need to closely watch this medication due to potential risk of impulsive control disorder and lower extremity edema. -Will start patient on Sinemet CR 50/200 one tablet by mouth at evening time. In the future may consider addition of standing Sinemet IR during the daytime. -Continue Azilect 1 mg daily -Referral given for physical therapy for gait instability. Patient may benefit from the program. -In the future can and consider addition of Klonopinor melatonin at bedtime for RBD -Will check MOCA or  Mini-Mental status exam at next visit -follow up in 2 months  Patient was given refills and instructions for patient assistance programs and door-to-door for Mirapex and Azilect.  A total of 30 minutes or so with this patient. Over half the time was spent in direct face-to-face consultation. We discussed her current diagnosis, different medication adjustments we could make. He also discussed medication options such as physical therapy. All questions were fully answered.   I have read the note, and I agree with the clinical assessment and plan.  Lesly Dukes

## 2012-11-13 ENCOUNTER — Telehealth: Payer: Self-pay | Admitting: Neurology

## 2012-11-15 ENCOUNTER — Other Ambulatory Visit: Payer: Self-pay | Admitting: Family Medicine

## 2012-11-18 ENCOUNTER — Other Ambulatory Visit: Payer: Self-pay | Admitting: *Deleted

## 2012-11-18 ENCOUNTER — Telehealth: Payer: Self-pay | Admitting: *Deleted

## 2012-11-18 MED ORDER — HYDROCHLOROTHIAZIDE 12.5 MG PO CAPS: ORAL_CAPSULE | ORAL | Status: DC

## 2012-11-18 NOTE — Telephone Encounter (Signed)
Medication was originally filled on 11/15/12 however [facility] stated that there pharmacy never received it by [facility].. I have attempted to call and make contact with patient ,but I had to leave a vm. RX has been refilled for 30 day supply.  As stated in patient chart she will not receive another refill without a office visit per Dr. Beverely Low, if patient calls office  please advise.  AG cma

## 2012-12-20 ENCOUNTER — Other Ambulatory Visit: Payer: Self-pay

## 2012-12-20 DIAGNOSIS — G20 Parkinson's disease: Secondary | ICD-10-CM

## 2012-12-20 MED ORDER — PRAMIPEXOLE DIHYDROCHLORIDE ER 3 MG PO TB24: 1.0000 | ORAL_TABLET | Freq: Every day | ORAL | Status: DC

## 2012-12-20 NOTE — Telephone Encounter (Signed)
Patient called saying she would like the Mirpaex Rx sent to [facility] instead of the other pharmacy.

## 2012-12-23 ENCOUNTER — Other Ambulatory Visit: Payer: Self-pay | Admitting: Family Medicine

## 2012-12-24 ENCOUNTER — Encounter: Payer: Self-pay | Admitting: *Deleted

## 2012-12-25 ENCOUNTER — Ambulatory Visit (INDEPENDENT_AMBULATORY_CARE_PROVIDER_SITE_OTHER): Payer: BC Managed Care – PPO | Admitting: Family Medicine

## 2012-12-25 ENCOUNTER — Encounter: Payer: Self-pay | Admitting: Family Medicine

## 2012-12-25 VITALS — BP 128/84 | HR 76 | Temp 98.6°F | Wt 184.8 lb

## 2012-12-25 DIAGNOSIS — G20 Parkinson's disease: Secondary | ICD-10-CM

## 2012-12-25 DIAGNOSIS — I10 Essential (primary) hypertension: Secondary | ICD-10-CM

## 2012-12-25 LAB — BASIC METABOLIC PANEL
BUN: 16 mg/dL (ref 6–23)
CO2: 29 mEq/L (ref 19–32)
Chloride: 100 mEq/L (ref 96–112)
Creatinine, Ser: 1.3 mg/dL — ABNORMAL HIGH (ref 0.4–1.2)
Glucose, Bld: 92 mg/dL (ref 70–99)

## 2012-12-25 MED ORDER — HYDROCHLOROTHIAZIDE 12.5 MG PO CAPS: ORAL_CAPSULE | ORAL | Status: DC

## 2012-12-25 NOTE — Patient Instructions (Addendum)
Schedule your complete physical at your convenience We'll notify you of your lab results and make any changes if needed Call with any questions or concerns You look great! Happy Fall!!!

## 2012-12-25 NOTE — Progress Notes (Signed)
  Subjective:    Patient ID: Carly [name], female    DOB: [birth year], 67 y.o.   MRN: [id number]  HPI HTN- chronic problem, on Lisinopril.  Walking 3 miles daily.  No CP, SOB, HAs, visual changes, edema.  Parkinson's- pt's new provider is Dr Hosie Poisson at [facility].  Pt has only met provider 1 time and so far is pleased.  Was planning to leave office when Dr Sandria Manly retired b/c she has had some issues there previously.  Recently added Azilect to regimen.   Review of Systems For ROS see HPI     Objective:   Physical Exam  Vitals reviewed. Constitutional: She is oriented to person, place, and time. She appears well-developed and well-nourished. No distress.  HENT:  Head: Normocephalic and atraumatic.  Eyes: Conjunctivae and EOM are normal. Pupils are equal, round, and reactive to light.  Neck: Normal range of motion. Neck supple. No thyromegaly present.  Cardiovascular: Normal rate, regular rhythm, normal heart sounds and intact distal pulses.   No murmur heard. Pulmonary/Chest: Effort normal and breath sounds normal. No respiratory distress.  Abdominal: Soft. She exhibits no distension. There is no tenderness.  Musculoskeletal: She exhibits no edema.  Lymphadenopathy:    She has no cervical adenopathy.  Neurological: She is alert and oriented to person, place, and time.  Skin: Skin is warm and dry.  Psychiatric: She has a normal mood and affect. Her behavior is normal.          Assessment & Plan:

## 2012-12-25 NOTE — Assessment & Plan Note (Signed)
Chronic problem.  Now following w/ Dr Hosie Poisson at [facility].  If pt decides she is unhappy w/ new provider will refer to either [facility] or Dr Tat.  Pt expressed understanding and is in agreement w/ plan.

## 2012-12-25 NOTE — Assessment & Plan Note (Signed)
Chronic problem.  Well controlled today.  Asymptomatic.  Check BMP to assess Cr and K+.  No anticipated med changes.

## 2012-12-26 ENCOUNTER — Encounter: Payer: Self-pay | Admitting: General Practice

## 2012-12-31 ENCOUNTER — Encounter: Payer: Self-pay | Admitting: Neurology

## 2012-12-31 ENCOUNTER — Ambulatory Visit (INDEPENDENT_AMBULATORY_CARE_PROVIDER_SITE_OTHER): Payer: Medicare Other | Admitting: Neurology

## 2012-12-31 VITALS — BP 143/72 | HR 68 | Ht 64.25 in | Wt 186.0 lb

## 2012-12-31 DIAGNOSIS — G20 Parkinson's disease: Secondary | ICD-10-CM

## 2012-12-31 MED ORDER — CARBIDOPA-LEVODOPA ER 25-100 MG PO TBCR: 1.0000 | EXTENDED_RELEASE_TABLET | Freq: Every day | ORAL | Status: DC

## 2012-12-31 NOTE — Progress Notes (Signed)
Provider:  Dr Hosie Poisson Referring Provider: Sheliah Hatch, MD Primary Care Physician:  [name], MD  CC:  PD follow up  HPI:  Carly [name] is a 67 y.o. female here as a follow up of her PD.   Overall doing well since last visit. Started taking the Sinemet CR 5200 at bedtime. Notes good relief of the nocturnal cramping in her lower extremities. Expresses concern that this medicine is causing her to feel dizzy and lightheaded. This occurs throughout the night and the following morning. Continues on Mirapex 3.0 mg ER daily, takes in the morning and Azilect 1 mg which she takes in the evening. Denies any impulse control no hallucinations no sleep attacks no lower extremity edema. Feels she continues to get good benefit from these medications. Expresses concern over the cost of Mirapex, as she will be going into the Medicare hole. Has not done physical therapy, her insurance didn't not cover the location in [facility] She will be planning to attend a location in [facility]. Denies any difficulty with gait, no instability no trips no falls.  Overall no other acute concerns at this time. Recently saw her primary care physician, notes everything went well.  Concerns/Questions:Review of Systems: Out of a complete 14 system review, the patient complains of only the following symptoms, and all other reviewed systems are negative. Positive for fatigue blurred vision feeling hot dizziness tremor restless leg sleepiness constipation ringing in ears anxiety none of sleep decreased energy racing thoughts  History   Social History  . Marital Status: Married    Spouse Name: N/A    Number of Children: 1  . Years of Education: N/A   Occupational History  . unempolyed     Social History Main Topics  . Smoking status: Never Smoker   . Smokeless tobacco: Never Used  . Alcohol Use: Yes     Comment: 1 per month    . Drug Use: No  . Sexual Activity: Not on file   Other Topics Concern  .  Not on file   Social History Narrative   Patient lives at home with partner Carly [name].    Patient has one adult child.    Patient has 14 years of education.    Patient does not work.     Family History  Problem Relation Age of Onset  . Heart attack Father   . Heart failure Mother     Past Medical History  Diagnosis Date  . Parkinson's disease   . Anxiety   . Hypertension     History reviewed. No pertinent past surgical history.  Current Outpatient Prescriptions  Medication Sig Dispense Refill  . ALPRAZolam (XANAX) 0.25 MG tablet Take 1 tablet (0.25 mg total) by mouth 3 (three) times daily as needed. For anxiety  60 tablet  1  . aspirin EC 81 MG EC tablet Take 81 mg by mouth daily.        . carbidopa-levodopa (SINEMET CR) 50-200 MG per tablet Take 1 tablet by mouth at bedtime.  90 tablet  3  . hydrochlorothiazide (MICROZIDE) 12.5 MG capsule take 1 capsule by mouth once daily  30 capsule  6  . Pramipexole Dihydrochloride (MIRAPEX ER) 3 MG TB24 Take 1 tablet (3 mg total) by mouth daily.  90 tablet  3  . rasagiline (AZILECT) 1 MG TABS Take 1 tablet (1 mg total) by mouth daily.  90 tablet  3   No current facility-administered medications for this visit.  Allergies as of 12/31/2012 - Review Complete 12/31/2012  Allergen Reaction Noted  . Codeine  03/04/2008    Vitals: BP 143/72  Pulse 68  Ht 5' 4.25" (1.632 m)  Wt 186 lb (84.369 kg)  BMI 31.68 kg/m2 Last Weight:  Wt Readings from Last 1 Encounters:  12/31/12 186 lb (84.369 kg)   Last Height:   Ht Readings from Last 1 Encounters:  12/31/12 5' 4.25" (1.632 m)     Physical exam: Exam: Exam:  Gen: NAD, conversant  Eyes: anicteric sclerae, moist conjunctivae  HENT: Atraumatic,  Lungs: CTA, no wheezing, rales, rhonic  CV: RRR, no MRG  Abdomen: Soft, non-tender;  Extremities:mild 1+ bilat LE pitting edema  Skin: Normal temperature, no rash,  Psych: Appropriate affect, pleasant  Neuro:  MS:  MOCA 28/30 -1  executive fnct -1 delayed recall  CN:  PERRL, EOMI no nystagmus, minimal ptosis, sensation intact to LT V1-V3 bilat, face symmetric, no weakness, shoulder shrug 5/5 bilat,  tongue protrudes midline, no fasiculations noted.   Motor: normal bulk and tone  Strength:  5/5 In all extremities   Reflexes: symmetrical, bilat downgoing toes  Sens: LT intact in all extremities   Brief Motor UPDRS  Speech: wnl  Facial Expression:  Mild masked facies  Tremor:  Rest  R1  L0  Action/postural  R1  L0  Rigidity:  RUE: 1  LUE: 1  Finger taps:  R:1  L:1  Open/close hands:  R:1  L:0  Foot taps:  R:1  L:1  Arising from Chair:  1  Gait/FOG:  1, mild stooped, slow, decreased arm swing on R with re-emergent tremor, retropulses but catches herself    Assessment:  After physical and neurologic examination, review of laboratory studies, imaging, neurophysiology testing and pre-existing records, assessment will be reviewed on the problem list.  Plan:  Treatment plan and additional workup will be reviewed under Problem List.  [name] is a pleasant 67y/o woman with a hx of PD returning for follow up visit. Overall doing well, no noted progression in PD symptoms. Main concern is dizziness associated with evening dose of Sinemet CR 50-200. Also expresses concern over Medicare hole and cost of Mirapex ER. Patients labs from recent PCP visit reviewed, patient counseled to drink plenty of fluids and f/u with PCP for elevated Cr level.   1)Parkinsons disease  -decrease Sinemet CR to 25-100 at bedtime -continue Azilect 1mg  daily -continue Mirapex ER 3 mg daily. In the future may switch to IR tablets for cost savings -patient given contact information for PD Medicare plan guidance program to help find Medicare plan that will best cover her medications -referral given for PT/[facility] program -follow up in 4 months or earlier if needed

## 2012-12-31 NOTE — Patient Instructions (Signed)
Overall you are doing fairly well but I do want to suggest a few things today:   Remember to drink plenty of fluid, eat healthy meals and do not skip any meals. Try to eat protein with a every meal and eat a healthy snack such as fruit or nuts in between meals. Try to keep a regular sleep-wake schedule and try to exercise daily, particularly in the form of walking, 20-30 minutes a day, if you can.   As far as your medications are concerned, I would like to suggest decreasing the Sinemet CR to a 25-100 tablet. Continue to take this at bedtime.  We will not make any changes to your Mirapex or Azilect dosing.   Please call [phone] for assistance in finding a Medicare plan that covers your medications.   We gave you a referral for physical therapy  I would like to see you back in 4 to 6 months, sooner if we need to. Please call us with any interim questions, concerns, problems, updates or refill requests.   My clinical assistant and will answer any of your questions and relay your messages to me and also relay most of my messages to you.   Our phone number is [phone]. We also have an after hours call service for urgent matters and there is a physician on-call for urgent questions. For any emergencies you know to call 911 or go to the nearest emergency room

## 2013-02-10 ENCOUNTER — Encounter: Payer: Self-pay | Admitting: Family Medicine

## 2013-03-10 ENCOUNTER — Telehealth: Payer: Self-pay | Admitting: *Deleted

## 2013-03-10 NOTE — Telephone Encounter (Signed)
Called patient to see which medication she was talking about which is mirapex and the azilect. Patient stated that her insurance will not be paying for this in 2015. Patient wanted to know if there was something else that she could take and be able to afford and the insurance will pay for. Please advise.

## 2013-03-10 NOTE — Telephone Encounter (Signed)
I called patient. I left a message. The Mirapex could be converted to a short acting, 1 mg 3 times daily. The Azilect does not have generic, but I suppose another medication such as selegiline could be used. If this is not an emergency, I will leave this decision up to Dr. Hosie Poisson. I'll send him the note.

## 2013-03-17 ENCOUNTER — Other Ambulatory Visit: Payer: Self-pay | Admitting: Neurology

## 2013-03-17 MED ORDER — PRAMIPEXOLE DIHYDROCHLORIDE 1 MG PO TABS: 1.0000 mg | ORAL_TABLET | Freq: Three times a day (TID) | ORAL | Status: DC

## 2013-03-17 NOTE — Telephone Encounter (Signed)
Called patient back. Will change to generic Pramipexole IR 1mg  TID. Will decrease to Azilect 0.5mg  daily until current prescription runs out and then discontinue. At future visit would consider increase in pramipexole or addition of selegeline if symptoms warrant.

## 2013-04-29 ENCOUNTER — Encounter: Payer: Self-pay | Admitting: Neurology

## 2013-04-29 ENCOUNTER — Encounter (INDEPENDENT_AMBULATORY_CARE_PROVIDER_SITE_OTHER): Payer: Self-pay

## 2013-04-29 ENCOUNTER — Ambulatory Visit (INDEPENDENT_AMBULATORY_CARE_PROVIDER_SITE_OTHER): Payer: Medicare Other | Admitting: Neurology

## 2013-04-29 VITALS — BP 160/71 | HR 59 | Ht 64.75 in | Wt 183.0 lb

## 2013-04-29 DIAGNOSIS — G20A1 Parkinson's disease without dyskinesia, without mention of fluctuations: Secondary | ICD-10-CM

## 2013-04-29 DIAGNOSIS — G20 Parkinson's disease: Secondary | ICD-10-CM

## 2013-04-29 DIAGNOSIS — R269 Unspecified abnormalities of gait and mobility: Secondary | ICD-10-CM

## 2013-04-29 DIAGNOSIS — R2681 Unsteadiness on feet: Secondary | ICD-10-CM

## 2013-04-29 NOTE — Progress Notes (Signed)
Provider:  Dr Hosie PoissonSumner Referring Provider: Sheliah Hatchabori, Katherine E, MD Primary Care Physician:  Neena RhymesKatherine [name], MD  CC:  PD follow up  HPI:  Carly EvertsBeverly B [name] is a 68 y.o. female here as a follow up of her PD. Doing well at last visit, since then her Mirapex was changed to 1mg  TID and the Azilect was discontinued due to cost. Presents today noting some dizziness, remains at night. Initially felt different when switching to TID Mirapex, has gotten on a steady schedule and feels she is doing well on this regimen.  Contineus to have some sensation of feeling off balance, notices it only happens when walking down the stairs, when wearing her reading glasses.   Has had some REM behavior disorder in the past, that has improved.    Prior visit 12/31/2012: Overall doing well since last visit. Started taking the Sinemet CR 5200 at bedtime. Notes good relief of the nocturnal cramping in her lower extremities. Expresses concern that this medicine is causing her to feel dizzy and lightheaded. This occurs throughout the night and the following morning. Continues on Mirapex 3.0 mg ER daily, takes in the morning and Azilect 1 mg which she takes in the evening. Denies any impulse control no hallucinations no sleep attacks no lower extremity edema. Feels she continues to get good benefit from these medications. Expresses concern over the cost of Mirapex, as she will be going into the Medicare hole. Has not done physical therapy, [place] [facility] [place] [facility]. She will be planning [place] [facility]. Denies any difficulty with gait, no instability no trips no falls.  Overall no other acute concerns at this time. Recently saw her primary care physician, notes everything went well.  Concerns/Questions:Review of Systems: Out of a complete 14 system review, the patient complains of only the following symptoms, and all other reviewed systems are negative. Positive for dizziness  tremors back pain  History   Social History  . Marital Status: Married    Spouse Name: N/A    Number of Children: 1  . Years of Education: N/A   Occupational History  . unempolyed     Social History Main Topics  . Smoking status: Never Smoker   . Smokeless tobacco: Never Used  . Alcohol Use: No  . Drug Use: No  . Sexual Activity: Not on file   Other Topics Concern  . Not on file   Social History Narrative   Patient lives at home with partner Carly [name].    Patient has one adult child.    Patient has 14 years of education.    Patient does not work.   Caffeine consumption is 1 cup daily     Family History  Problem Relation Age of Onset  . Heart attack Father   . Heart failure Mother     Past Medical History  Diagnosis Date  . Parkinson's disease   . Anxiety   . Hypertension     No past surgical history on file.  Current Outpatient Prescriptions  Medication Sig Dispense Refill  . ALPRAZolam (XANAX) 0.25 MG tablet Take 1 tablet (0.25 mg total) by mouth 3 (three) times daily as needed. For anxiety  60 tablet  1  . aspirin EC 81 MG EC tablet Take 81 mg by mouth daily.        . Carbidopa-Levodopa ER (SINEMET CR) 25-100 MG tablet controlled release Take 1 tablet by mouth at bedtime.  90 tablet  3  . hydrochlorothiazide (MICROZIDE) 12.5 MG capsule take 1 capsule by mouth once daily  30 capsule  6  . pramipexole (MIRAPEX) 1 MG tablet Take 1 tablet (1 mg total) by mouth 3 (three) times daily.  990 tablet  6   No current facility-administered medications for this visit.    Allergies as of 04/29/2013 - Review Complete 04/29/2013  Allergen Reaction Noted  . Codeine  03/04/2008    Vitals: BP 160/71  Pulse 59  Ht 5' 4.75" (1.645 m)  Wt 183 lb (83.008 kg)  BMI 30.68 kg/m2 Last Weight:  Wt Readings from Last 1 Encounters:  04/29/13 183 lb (83.008 kg)   Last Height:   Ht Readings from Last 1 Encounters:  04/29/13 5' 4.75" (1.645 m)     Physical  exam: Exam: Exam:  Gen: NAD, conversant  Eyes: anicteric sclerae, moist conjunctivae  HENT: Atraumatic,  Lungs: CTA, no wheezing, rales, rhonic  CV: RRR, no MRG  Abdomen: Soft, non-tender;  Extremities:mild 1+ bilat LE pitting edema  Skin: Normal temperature, no rash,  Psych: Appropriate affect, pleasant  Neuro:  Carly:   MOCA 28/30 at prior visit -1 executive fnct -1 delayed recall  CN:  PERRL, EOMI no nystagmus, minimal ptosis, sensation intact to LT V1-V3 bilat, face symmetric, no weakness, shoulder shrug 5/5 bilat,  tongue protrudes midline, no fasiculations noted.   Motor: normal bulk and tone  Strength:  5/5 In all extremities   Reflexes: symmetrical, bilat downgoing toes  Sens: LT intact in all extremities   Brief Motor UPDRS  Speech: wnl  Facial Expression:  Mild masked facies  Tremor:  Rest  R1  L0  Action/postural  R1  L0  Rigidity:  RUE: 1  LUE: 0 Finger taps:  R:1  L:1  Open/close hands:  R:1  L:0  Foot taps:  R:1  L:1  Arising from Chair:  1  Gait/FOG:  1, mild stooped, slow, decreased arm swing on R with re-emergent tremor, retropulses but catches herself    Assessment:  After physical and neurologic examination, review of laboratory studies, imaging, neurophysiology testing and pre-existing records, assessment will be reviewed on the problem list.  Plan:  Treatment plan and additional workup will be reviewed under Problem List.  Carly [name] is a pleasant 67y/o woman with a hx of PD returning for follow up visit. Overall doing well, no noted progression in PD symptoms. She has some gait instability which appears to be related to wearing her reading glasses/bifocals. Since last visit she was switched to  Mirapex IR 1mg  TID and the Azilect was discontinued. She is tolerating these changes well. We will try to take her off the evening dose of Sinemet, will restart if not tolerating.  1)Parkinsons disease 2)Gait instability  -discontinue PM  Sinemet CR dose -continue Mirapex IR 1mg  TID. -follow up in 4 months or earlier if needed

## 2013-05-02 ENCOUNTER — Ambulatory Visit: Payer: Medicare Other | Admitting: Neurology

## 2013-05-08 ENCOUNTER — Telehealth: Payer: Self-pay | Admitting: Neurology

## 2013-05-08 NOTE — Telephone Encounter (Signed)
Carly [name] with [facility] calling for the  non-formulary approval for Mirapex 05/05/2013 and the authorization is good for 1 year. Pharmacy and patient are aware. Office will receive a copy.

## 2013-07-08 NOTE — Telephone Encounter (Signed)
Closing encounter

## 2013-07-30 ENCOUNTER — Other Ambulatory Visit: Payer: Self-pay | Admitting: Family Medicine

## 2013-07-30 NOTE — Telephone Encounter (Signed)
Med filed #30 with 0, pt due for follow up appt.

## 2013-08-28 ENCOUNTER — Other Ambulatory Visit: Payer: Self-pay | Admitting: Family Medicine

## 2013-08-28 NOTE — Telephone Encounter (Signed)
Med filled.  

## 2013-08-29 ENCOUNTER — Ambulatory Visit (INDEPENDENT_AMBULATORY_CARE_PROVIDER_SITE_OTHER): Payer: Medicare Other | Admitting: Neurology

## 2013-08-29 ENCOUNTER — Encounter: Payer: Self-pay | Admitting: Neurology

## 2013-08-29 VITALS — BP 172/77 | HR 62 | Ht 64.75 in | Wt 182.0 lb

## 2013-08-29 DIAGNOSIS — G20 Parkinson's disease: Secondary | ICD-10-CM

## 2013-08-29 DIAGNOSIS — G609 Hereditary and idiopathic neuropathy, unspecified: Secondary | ICD-10-CM

## 2013-08-29 MED ORDER — PRAMIPEXOLE DIHYDROCHLORIDE 1 MG PO TABS: 1.0000 mg | ORAL_TABLET | Freq: Three times a day (TID) | ORAL | Status: DC

## 2013-08-29 NOTE — Progress Notes (Signed)
Provider:  Dr Hosie Poisson Referring Provider: Sheliah Hatch, MD Primary Care Physician:  [name], MD  CC:  PD follow up  HPI:  Carly [name] is a 68 y.o. female here as a follow up of her PD. Her last visit 04/2013 at which time she was doing well overall. Her evening dose of Sinemet CR was discontinued and she is currently taking just Mirapex 1mg  TID. Notes some episodes where he feet get tingly, described as a pins and needles type sensation. No other acute concerns at thsi time    Initial visit 12/31/2012: Overall doing well since last visit. Started taking the Sinemet CR 5200 at bedtime. Notes good relief of the nocturnal cramping in her lower extremities. Expresses concern that this medicine is causing her to feel dizzy and lightheaded. This occurs throughout the night and the following morning. Continues on Mirapex 3.0 mg ER daily, takes in the morning and Azilect 1 mg which she takes in the evening. Denies any impulse control no hallucinations no sleep attacks no lower extremity edema. Feels she continues to get good benefit from these medications. Expresses concern over the cost of Mirapex, as she will be going into the Medicare hole. Has not done physical therapy, her insurance didn't not cover the location in [facility]. She will be planning to attend a location in [facility]. Denies any difficulty with gait, no instability no trips no falls.  Overall no other acute concerns at this time. Recently saw her primary care physician, notes everything went well.  Concerns/Questions:Review of Systems: Out of a complete 14 system review, the patient complains of only the following symptoms, and all other reviewed systems are negative. Positive for tremors foot pain  History   Social History  . Marital Status: Married    Spouse Name: N/A    Number of Children: 1  . Years of Education: N/A   Occupational History  . unempolyed     Social History Main Topics  . Smoking  status: Never Smoker   . Smokeless tobacco: Never Used  . Alcohol Use: No  . Drug Use: No  . Sexual Activity: Not on file   Other Topics Concern  . Not on file   Social History Narrative   Patient lives at home with partner Demetrios Loll.    Patient has one adult child.    Patient has 14 years of education.    Patient does not work.   Caffeine consumption is 1 cup daily     Family History  Problem Relation Age of Onset  . Heart attack Father   . Heart failure Mother     Past Medical History  Diagnosis Date  . Parkinson's disease   . Anxiety   . Hypertension     No past surgical history on file.  Current Outpatient Prescriptions  Medication Sig Dispense Refill  . ALPRAZolam (XANAX) 0.25 MG tablet Take 1 tablet (0.25 mg total) by mouth 3 (three) times daily as needed. For anxiety  60 tablet  1  . aspirin EC 81 MG EC tablet Take 81 mg by mouth daily.        . hydrochlorothiazide (MICROZIDE) 12.5 MG capsule TAKE 1 CAPSULE BY MOUTH ONCE DAILY  30 capsule  0  . pramipexole (MIRAPEX) 1 MG tablet Take 1 tablet (1 mg total) by mouth 3 (three) times daily.  990 tablet  6   No current facility-administered medications for this visit.    Allergies as of 08/29/2013 - Review  Complete 08/29/2013  Allergen Reaction Noted  . Codeine  03/04/2008    Vitals: BP 172/77  Pulse 62  Ht 5' 4.75" (1.645 m)  Wt 182 lb (82.555 kg)  BMI 30.51 kg/m2 Last Weight:  Wt Readings from Last 1 Encounters:  08/29/13 182 lb (82.555 kg)   Last Height:   Ht Readings from Last 1 Encounters:  08/29/13 5' 4.75" (1.645 m)     Physical exam: Exam: Exam:  Gen: NAD, conversant  Eyes: anicteric sclerae, moist conjunctivae  HENT: Atraumatic,  Lungs: CTA, no wheezing, rales, rhonic  CV: RRR, no MRG  Abdomen: Soft, non-tender;  Extremities:mild 1+ bilat LE pitting edema  Skin: Normal temperature, no rash,  Psych: Appropriate affect, pleasant  Neuro:  Carly:   MOCA 28/30 at prior visit -1  executive fnct -1 delayed recall  CN:  PERRL, EOMI no nystagmus, minimal ptosis, sensation intact to LT V1-V3 bilat, face symmetric, no weakness, shoulder shrug 5/5 bilat,  tongue protrudes midline, no fasiculations noted.   Motor: normal bulk and tone  Strength:  5/5 In all extremities   Reflexes: symmetrical, bilat downgoing toes  Sens: LT intact in all extremities, intact PP, temp and vibration in bilateral LE  Brief Motor UPDRS  Speech: wnl  Facial Expression:  Mild masked facies  Tremor:  Rest  R1  L0  Action/postural  R1  L0  Rigidity:  RUE: 1  LUE: 0 Finger taps:  R:1  L:1  Open/close hands:  R:1  L:0  Foot taps:  R:1  L:1  Arising from Chair:  1  Gait/FOG:  1, mild stooped, decreased arm swing on R, retropulses but catches herself    Assessment:  After physical and neurologic examination, review of laboratory studies, imaging, neurophysiology testing and pre-existing records, assessment will be reviewed on the problem list.  Plan:  Treatment plan and additional workup will be reviewed under Problem List.   1)Parkinsons disease 2)peripheral neuropathy  Carly [name] is a pleasant 67y/o woman with a hx of PD returning for follow up visit. Overall doing well, no noted progression in PD symptoms. She notes some new onset foot paresthesias, this appears to be more consistent with a mild peripheral neuropathy. Will check blood work. Will not make any changes to her Mirapex at this time, refills given. Follow up in 4 months or earlier if needed.

## 2013-08-29 NOTE — Patient Instructions (Signed)
Overall you are doing fairly well but I do want to suggest a few things today:   Remember to drink plenty of fluid, eat healthy meals and do not skip any meals. Try to eat protein with a every meal and eat a healthy snack such as fruit or nuts in between meals. Try to keep a regular sleep-wake schedule and try to exercise daily, particularly in the form of walking, 20-30 minutes a day, if you can.   As far as your medications are concerned, I would like to suggest not making any changes to your Mirapex at this time  As far as diagnostic testing:  1)Please have some blood work checked after you check out  Please follow up in 4 months. Please call us with any interim questions, concerns, problems, updates or refill requests.   My clinical assistant and will answer any of your questions and relay your messages to me and also relay most of my messages to you.   Our phone number is [phone]. We also have an after hours call service for urgent matters and there is a physician on-call for urgent questions. For any emergencies you know to call 911 or go to the nearest emergency room

## 2013-09-02 LAB — VITAMIN B12: Vitamin B-12: 231 pg/mL (ref 211–946)

## 2013-09-02 LAB — TSH: TSH: 1.38 u[IU]/mL (ref 0.450–4.500)

## 2013-09-02 LAB — HGB A1C W/O EAG: Hgb A1c MFr Bld: 5.8 % — ABNORMAL HIGH (ref 4.8–5.6)

## 2013-09-02 LAB — METHYLMALONIC ACID, SERUM: METHYLMALONIC ACID: 552 nmol/L — AB (ref 0–378)

## 2013-09-04 ENCOUNTER — Encounter: Payer: Self-pay | Admitting: *Deleted

## 2013-09-04 NOTE — Progress Notes (Signed)
Quick Note:  I called and gave her results of labs. Low B 12 and Dr. Minus Breeding recommendation for B 12 injections. (cyanocobalamin IM daily for 5 days, then once weekly for 4 weeks, then once monthly). Informed that her HbgA1c level at increased risk for diabetes. (diet and exercise, decrease sugar intake). She verbalized understanding. She asked for copy, mailed, but also release on Mychart. DONE. Will forward results to pcp, Dr. Beverely Low, since she asked to go there because of closer proximity. ______

## 2013-09-15 ENCOUNTER — Ambulatory Visit (INDEPENDENT_AMBULATORY_CARE_PROVIDER_SITE_OTHER): Payer: BC Managed Care – PPO | Admitting: *Deleted

## 2013-09-15 DIAGNOSIS — E538 Deficiency of other specified B group vitamins: Secondary | ICD-10-CM

## 2013-09-15 MED ORDER — CYANOCOBALAMIN 1000 MCG/ML IJ SOLN
1000.0000 ug | Freq: Once | INTRAMUSCULAR | Status: AC
  Administered 2013-09-15: 1000 ug via INTRAMUSCULAR

## 2013-09-16 ENCOUNTER — Ambulatory Visit (INDEPENDENT_AMBULATORY_CARE_PROVIDER_SITE_OTHER): Payer: BC Managed Care – PPO | Admitting: *Deleted

## 2013-09-16 DIAGNOSIS — E538 Deficiency of other specified B group vitamins: Secondary | ICD-10-CM

## 2013-09-16 MED ORDER — CYANOCOBALAMIN 1000 MCG/ML IJ SOLN
1000.0000 ug | Freq: Once | INTRAMUSCULAR | Status: AC
  Administered 2013-09-16: 1000 ug via INTRAMUSCULAR

## 2013-09-17 ENCOUNTER — Ambulatory Visit (INDEPENDENT_AMBULATORY_CARE_PROVIDER_SITE_OTHER): Payer: BC Managed Care – PPO | Admitting: *Deleted

## 2013-09-17 DIAGNOSIS — E538 Deficiency of other specified B group vitamins: Secondary | ICD-10-CM

## 2013-09-17 MED ORDER — CYANOCOBALAMIN 1000 MCG/ML IJ SOLN
1000.0000 ug | Freq: Once | INTRAMUSCULAR | Status: AC
  Administered 2013-09-17: 1000 ug via INTRAMUSCULAR

## 2013-09-18 ENCOUNTER — Ambulatory Visit (INDEPENDENT_AMBULATORY_CARE_PROVIDER_SITE_OTHER): Payer: BC Managed Care – PPO | Admitting: *Deleted

## 2013-09-18 DIAGNOSIS — E538 Deficiency of other specified B group vitamins: Secondary | ICD-10-CM

## 2013-09-18 MED ORDER — CYANOCOBALAMIN 1000 MCG/ML IJ SOLN
1000.0000 ug | Freq: Once | INTRAMUSCULAR | Status: AC
  Administered 2013-09-18: 1000 ug via INTRAMUSCULAR

## 2013-09-19 ENCOUNTER — Ambulatory Visit (INDEPENDENT_AMBULATORY_CARE_PROVIDER_SITE_OTHER): Payer: BC Managed Care – PPO | Admitting: *Deleted

## 2013-09-19 DIAGNOSIS — E538 Deficiency of other specified B group vitamins: Secondary | ICD-10-CM

## 2013-09-19 MED ORDER — CYANOCOBALAMIN 1000 MCG/ML IJ SOLN
1000.0000 ug | Freq: Once | INTRAMUSCULAR | Status: AC
  Administered 2013-09-19: 1000 ug via INTRAMUSCULAR

## 2013-09-25 ENCOUNTER — Ambulatory Visit (INDEPENDENT_AMBULATORY_CARE_PROVIDER_SITE_OTHER): Payer: BC Managed Care – PPO | Admitting: *Deleted

## 2013-09-25 ENCOUNTER — Ambulatory Visit: Payer: BC Managed Care – PPO

## 2013-09-25 DIAGNOSIS — E538 Deficiency of other specified B group vitamins: Secondary | ICD-10-CM

## 2013-09-25 MED ORDER — CYANOCOBALAMIN 1000 MCG/ML IJ SOLN
1000.0000 ug | Freq: Once | INTRAMUSCULAR | Status: AC
  Administered 2013-09-25: 1000 ug via INTRAMUSCULAR

## 2013-09-30 ENCOUNTER — Other Ambulatory Visit: Payer: Self-pay | Admitting: Family Medicine

## 2013-09-30 NOTE — Telephone Encounter (Signed)
Med filled.  

## 2013-10-01 ENCOUNTER — Ambulatory Visit (INDEPENDENT_AMBULATORY_CARE_PROVIDER_SITE_OTHER): Payer: BC Managed Care – PPO

## 2013-10-01 DIAGNOSIS — E538 Deficiency of other specified B group vitamins: Secondary | ICD-10-CM

## 2013-10-01 MED ORDER — CYANOCOBALAMIN 1000 MCG/ML IJ SOLN
1000.0000 ug | Freq: Once | INTRAMUSCULAR | Status: AC
  Administered 2013-10-01: 1000 ug via INTRAMUSCULAR

## 2013-10-08 ENCOUNTER — Ambulatory Visit (INDEPENDENT_AMBULATORY_CARE_PROVIDER_SITE_OTHER): Payer: BC Managed Care – PPO | Admitting: *Deleted

## 2013-10-08 DIAGNOSIS — E538 Deficiency of other specified B group vitamins: Secondary | ICD-10-CM

## 2013-10-08 MED ORDER — CYANOCOBALAMIN 1000 MCG/ML IJ SOLN
1000.0000 ug | Freq: Once | INTRAMUSCULAR | Status: AC
  Administered 2013-10-08: 1000 ug via INTRAMUSCULAR

## 2013-10-14 ENCOUNTER — Ambulatory Visit (INDEPENDENT_AMBULATORY_CARE_PROVIDER_SITE_OTHER): Payer: Medicare Other | Admitting: Family Medicine

## 2013-10-14 ENCOUNTER — Encounter: Payer: Self-pay | Admitting: Family Medicine

## 2013-10-14 VITALS — BP 140/88 | HR 62 | Temp 97.8°F | Resp 16 | Wt 183.2 lb

## 2013-10-14 DIAGNOSIS — I10 Essential (primary) hypertension: Secondary | ICD-10-CM

## 2013-10-14 LAB — BASIC METABOLIC PANEL
BUN: 14 mg/dL (ref 6–23)
CO2: 30 mEq/L (ref 19–32)
Calcium: 9.5 mg/dL (ref 8.4–10.5)
Chloride: 101 mEq/L (ref 96–112)
Creatinine, Ser: 1.2 mg/dL (ref 0.4–1.2)
GFR: 49.68 mL/min — AB (ref >60.00)
Glucose, Bld: 97 mg/dL (ref 70–99)
Potassium: 3.9 mEq/L (ref 3.5–5.1)
SODIUM: 138 meq/L (ref 135–145)

## 2013-10-14 LAB — HEPATIC FUNCTION PANEL
ALK PHOS: 64 U/L (ref 39–117)
ALT: 17 U/L (ref 0–35)
AST: 23 U/L (ref 0–37)
Albumin: 4.1 g/dL (ref 3.5–5.2)
Bilirubin, Direct: 0.1 mg/dL (ref 0.0–0.3)
Total Bilirubin: 0.9 mg/dL (ref 0.2–1.2)
Total Protein: 7.3 g/dL (ref 6.0–8.3)

## 2013-10-14 LAB — CBC WITH DIFFERENTIAL/PLATELET
BASOS ABS: 0 10*3/uL (ref 0.0–0.1)
Basophils Relative: 0.2 % (ref 0.0–3.0)
EOS ABS: 0.1 10*3/uL (ref 0.0–0.7)
Eosinophils Relative: 1 % (ref 0.0–5.0)
HCT: 37 % (ref 36.0–46.0)
HEMOGLOBIN: 12.2 g/dL (ref 12.0–15.0)
LYMPHS PCT: 24.4 % (ref 12.0–46.0)
Lymphs Abs: 1.6 10*3/uL (ref 0.7–4.0)
MCHC: 32.9 g/dL (ref 30.0–36.0)
MCV: 91.3 fl (ref 78.0–100.0)
MONO ABS: 0.4 10*3/uL (ref 0.1–1.0)
Monocytes Relative: 5.8 % (ref 3.0–12.0)
NEUTROS ABS: 4.5 10*3/uL (ref 1.4–7.7)
Neutrophils Relative %: 68.6 % (ref 43.0–77.0)
Platelets: 150 10*3/uL (ref 150.0–400.0)
RBC: 4.06 Mil/uL (ref 3.87–5.11)
RDW: 13 % (ref 11.5–15.5)
WBC: 6.5 10*3/uL (ref 4.0–10.5)

## 2013-10-14 LAB — LIPID PANEL
Cholesterol: 161 mg/dL (ref 0–200)
HDL: 61.9 mg/dL (ref >39.00)
LDL CALC: 86 mg/dL (ref 0–99)
NONHDL: 99.1
Total CHOL/HDL Ratio: 3
Triglycerides: 65 mg/dL (ref 0.0–149.0)
VLDL: 13 mg/dL (ref 0.0–40.0)

## 2013-10-14 LAB — TSH: TSH: 1.2 u[IU]/mL (ref 0.35–4.50)

## 2013-10-14 NOTE — Progress Notes (Signed)
   Subjective:    Patient ID: Carly [name], female    DOB: Jan 21, 1946, 68 y.o.   MRN: [id number]  HPI HTN- chronic problem, on HCTZ daily.  Checks BP at home twice daily- home BP was 117/64.  Pt reports BP is always elevated here [place] [facility]  No CP, SOB, HAs, visual changes, edema.   Review of Systems For ROS see HPI     Objective:   Physical Exam  Vitals reviewed. Constitutional: She is oriented to person, place, and time. She appears well-developed and well-nourished. No distress.  HENT:  Head: Normocephalic and atraumatic.  Eyes: Conjunctivae and EOM are normal. Pupils are equal, round, and reactive to light.  Neck: Normal range of motion. Neck supple. No thyromegaly present.  Cardiovascular: Normal rate, regular rhythm, normal heart sounds and intact distal pulses.   No murmur heard. Pulmonary/Chest: Effort normal and breath sounds normal. No respiratory distress.  Abdominal: Soft. She exhibits no distension. There is no tenderness.  Musculoskeletal: She exhibits no edema.  Lymphadenopathy:    She has no cervical adenopathy.  Neurological: She is alert and oriented to person, place, and time.  Skin: Skin is warm and dry.  Psychiatric: She has a normal mood and affect. Her behavior is normal.          Assessment & Plan:

## 2013-10-14 NOTE — Patient Instructions (Signed)
Schedule your complete physical at your convenience I'll notify you of your lab results and make any changes if needed Keep up the good work!  You look great! Call with any questions or concerns HAPPY EARLY BIRTHDAY!!!

## 2013-10-14 NOTE — Progress Notes (Signed)
Pre visit review using our clinic review tool, if applicable. No additional management support is needed unless otherwise documented below in the visit note. 

## 2013-10-14 NOTE — Assessment & Plan Note (Signed)
Chronic problem.  Elevated BP in office today but pt monitors BPs regularly at home.  Check BMP.  No anticipated med changes.

## 2013-10-15 ENCOUNTER — Telehealth: Payer: Self-pay | Admitting: Family Medicine

## 2013-10-15 NOTE — Telephone Encounter (Signed)
Relevant patient education assigned to patient using Emmi. ° °

## 2013-11-06 ENCOUNTER — Encounter: Payer: Self-pay | Admitting: Neurology

## 2013-11-11 ENCOUNTER — Ambulatory Visit: Payer: Medicare Other

## 2013-11-25 ENCOUNTER — Other Ambulatory Visit: Payer: Self-pay | Admitting: Family Medicine

## 2013-11-25 NOTE — Telephone Encounter (Signed)
Med filled.  

## 2013-12-25 ENCOUNTER — Other Ambulatory Visit: Payer: Self-pay | Admitting: General Practice

## 2013-12-25 MED ORDER — HYDROCHLOROTHIAZIDE 12.5 MG PO CAPS: ORAL_CAPSULE | ORAL | Status: DC

## 2014-01-01 ENCOUNTER — Encounter: Payer: Self-pay | Admitting: Nurse Practitioner

## 2014-01-01 ENCOUNTER — Ambulatory Visit (INDEPENDENT_AMBULATORY_CARE_PROVIDER_SITE_OTHER): Payer: Medicare Other | Admitting: Nurse Practitioner

## 2014-01-01 VITALS — BP 138/64 | HR 64 | Temp 96.7°F | Ht 64.0 in | Wt 184.0 lb

## 2014-01-01 DIAGNOSIS — G4752 REM sleep behavior disorder: Secondary | ICD-10-CM

## 2014-01-01 DIAGNOSIS — E538 Deficiency of other specified B group vitamins: Secondary | ICD-10-CM

## 2014-01-01 DIAGNOSIS — R2681 Unsteadiness on feet: Secondary | ICD-10-CM

## 2014-01-01 DIAGNOSIS — G20 Parkinson's disease: Secondary | ICD-10-CM

## 2014-01-01 DIAGNOSIS — G20A1 Parkinson's disease without dyskinesia, without mention of fluctuations: Secondary | ICD-10-CM

## 2014-01-01 HISTORY — DX: Deficiency of other specified B group vitamins: E53.8

## 2014-01-01 MED ORDER — ALPRAZOLAM 0.25 MG PO TABS: 0.2500 mg | ORAL_TABLET | Freq: Three times a day (TID) | ORAL | Status: DC | PRN

## 2014-01-01 NOTE — Progress Notes (Addendum)
PATIENT: Carly [name] Mondo DOB: [birth year]  REASON FOR VISIT: routine follow up for PD HISTORY FROM: patient  HISTORY OF PRESENT ILLNESS: Carly [name] Cafarella is a 68 y.o. female here as a follow up of her PD. She formerly saw Dr. Elspeth ChoPeter Sumner in our practice, and before that Dr. Avie EchevariaJames Love.  She states that she is doing well. She has been taking B12 injections at Dr. Rennis Goldenabori's office and has much more energy than previously. The pins and needles sensation in her feet has improved. Her brother has been diagnosed with liver and kidney cancer and this has her feeling very anxious/panicky at times. She notices some mild worsening of her right hand tremor lately. She has not had any falls. She is currently taking Mirapex 1 mg tid. She has no new complaints.  08/29/13 (PJS): Her last visit 04/2013 at which time she was doing well overall. Her evening dose of Sinemet CR was discontinued and she is currently taking just Mirapex 1mg  TID. Notes some episodes where he feet get tingly, described as a pins and needles type sensation. No other acute concerns at thsi time   Initial visit 12/31/2012 (PJS):  Overall doing well since last visit. Started taking the Sinemet CR 5200 at bedtime. Notes good relief of the nocturnal cramping in her lower extremities. Expresses concern that this medicine is causing her to feel dizzy and lightheaded. This occurs throughout the night and the following morning. Continues on Mirapex 3.0 mg ER daily, takes in the morning and Azilect 1 mg which she takes in the evening. Denies any impulse control no hallucinations no sleep attacks no lower extremity edema. Feels she continues to get good benefit from these medications. Expresses concern over the cost of Mirapex, as she will be going into the Medicare hole. Has not done physical therapy, her insurance didn[place] [facility] [place] [facility]. She will be [place] [facility] [place]. Denies any difficulty with  gait, no instability no trips no falls.  Overall no other acute concerns at this time. Recently saw her primary care physician, notes everything went well.    REVIEW OF SYSTEMS: Full 14 system review of systems performed and notable only for: fatigue, ringing in ears, loss of vision, swollen abdomen, constipation, restless legs, sleep talking, back pain.   ALLERGIES: Allergies  Allergen Reactions  . Codeine     REACTION: nausea    HOME MEDICATIONS: Outpatient Prescriptions Prior to Visit  Medication Sig Dispense Refill  . aspirin EC 81 MG EC tablet Take 81 mg by mouth daily.        . hydrochlorothiazide (MICROZIDE) 12.5 MG capsule TAKE 1 CAPSULE BY MOUTH ONCE DAILY  90 capsule  1  . pramipexole (MIRAPEX) 1 MG tablet Take 1 tablet (1 mg total) by mouth 3 (three) times daily.  270 tablet  6  . ALPRAZolam (XANAX) 0.25 MG tablet Take 1 tablet (0.25 mg total) by mouth 3 (three) times daily as needed. For anxiety  60 tablet  1   No facility-administered medications prior to visit.    PHYSICAL EXAM Filed Vitals:   01/01/14 1108  BP: 138/64  Pulse: 64  Temp: 96.7 F (35.9 C)  TempSrc: Oral  Height: 5\' 4"  (1.626 m)  Weight: 184 lb (83.462 kg)   Body mass index is 31.57 kg/(m^2). No exam data present No flowsheet data found.  No flowsheet data found.   Generalized: Well developed, in no acute distress  Head: normocephalic  and atraumatic. Oropharynx benign  Neck: Supple, no carotid bruits  Cardiac: Regular rate rhythm, no murmur  Musculoskeletal: No deformity   Neurological examination  Mentation: Alert oriented to time, place, history taking. Follows all commands speech and language fluent. Mild masked facies  Cranial nerve II-XII: Fundoscopic exam not done. Pupils were equal round reactive to light extraocular movements were full, visual field were full on confrontational test. Facial sensation and strength were normal. hearing was intact to finger rubbing bilaterally. Uvula  tongue midline. head turning and shoulder shrug and were normal and symmetric.Tongue protrusion into cheek strength was normal. Motor: The motor testing reveals 5 over 5 strength of all 4 extremities. Good symmetric motor tone is noted throughout.  Sensory: Sensory testing is intact to soft touch on all 4 extremities. No evidence of extinction is noted.  Coordination: Cerebellar testing reveals good finger-nose-finger and heel-to-shin bilaterally.  Gait and station: mild stooped, decreased arm swing on R, retropulses but catches herself  Reflexes: Deep tendon reflexes are symmetric and normal bilaterally.   DIAGNOSTIC DATA (LABS, IMAGING, TESTING) - I reviewed patient records, labs, notes, testing and imaging myself where available.  Lab Results  Component Value Date   WBC 6.5 10/14/2013   HGB 12.2 10/14/2013   HCT 37.0 10/14/2013   MCV 91.3 10/14/2013   PLT 150.0 10/14/2013      Component Value Date/Time   NA 138 10/14/2013 1153   K 3.9 10/14/2013 1153   CL 101 10/14/2013 1153   CO2 30 10/14/2013 1153   GLUCOSE 97 10/14/2013 1153   BUN 14 10/14/2013 1153   CREATININE 1.2 10/14/2013 1153   CALCIUM 9.5 10/14/2013 1153   PROT 7.3 10/14/2013 1153   ALBUMIN 4.1 10/14/2013 1153   AST 23 10/14/2013 1153   ALT 17 10/14/2013 1153   ALKPHOS 64 10/14/2013 1153   BILITOT 0.9 10/14/2013 1153   GFRNONAA 53.46 01/03/2010 0843   GFRAA 82 04/07/2008 0828   Lab Results  Component Value Date   CHOL 161 10/14/2013   HDL 61.90 10/14/2013   LDLCALC 86 10/14/2013   TRIG 65.0 10/14/2013   CHOLHDL 3 10/14/2013   Lab Results  Component Value Date   HGBA1C 5.8* 08/29/2013   Lab Results  Component Value Date   VITAMINB12 231 08/29/2013        MEHYLMALONIC ACID 552     08/29/2013  Lab Results  Component Value Date   TSH 1.20 10/14/2013    ASSESSMENT: 68 y.o. year old female  has a past medical history of Parkinson's disease; Anxiety; and Hypertension here with:  1) Parkinsons disease  2) peripheral neuropathy,  secondary to B12 deficiency.   Ms Lowman is a pleasant 68 y/o Caucasian woman with a hx of PD returning for follow up visit. Overall doing well, no noted progression in PD symptoms. She notes some new onset foot paresthesias, this appears to be more consistent with a mild peripheral neuropathy.    PLAN: Will not make any changes to her Mirapex at this time.  Continue B12 injections at Dr. Rennis Golden office. Refilled alprazolam, she uses infrequently. Follow up in 4 months with Dr. Frances Furbish or earlier if needed.   Meds ordered this encounter  Medications  . ALPRAZolam (XANAX) 0.25 MG tablet    Sig: Take 1 tablet (0.25 mg total) by mouth 3 (three) times daily as needed for anxiety. For anxiety    Dispense:  60 tablet    Refill:  1    Order Specific Question:  Supervising Provider    Answer:  Huston Foley [[id number]]   Return in about 4 months (around 05/04/2014) for Parkinson's Disease.  Tawny Asal Ulah Olmo, MSN, FNP-BC, A/GNP-C 01/01/2014, 1:02 PM [facility] [address], [address] [place], [place] [address] [phone]  Note: This document was prepared with digital dictation and possible smart phrase technology. Any transcriptional errors that result from this process are unintentional.  I reviewed the above note and documentation by the Nurse Practitioner and agree with the history, physical exam, assessment and plan as outlined above. Huston Foley, MD, PhD [facility])

## 2014-01-01 NOTE — Patient Instructions (Signed)
Will not make any changes to Mirapex at this time, refills given.  Xanax 0.25 mg three times daily as needed for anxiety. Try to continue to get regular exercise. Follow up in 4-6 months or earlier if needed, next visit with MD.

## 2014-01-05 ENCOUNTER — Telehealth: Payer: Self-pay | Admitting: Family Medicine

## 2014-01-05 NOTE — Telephone Encounter (Signed)
Caller name: Meriam SpragueBeverly Relation to pt: self Call back number: [phone] Pharmacy:  Reason for call:   Patient states that [facility] said that patient would benefit going back on b12 inj. Patient wants to know if she could schedule this or does she need to see Dr. Beverely Lowabori first

## 2014-01-06 NOTE — Telephone Encounter (Signed)
Pt scheduled for B12 injection tomorrow states she has been 5 weeks without one.

## 2014-01-06 NOTE — Telephone Encounter (Signed)
Pt can start monthly B12 injections as outlined by previous result note from Neuro (May 2015)

## 2014-01-07 ENCOUNTER — Ambulatory Visit (INDEPENDENT_AMBULATORY_CARE_PROVIDER_SITE_OTHER): Payer: Medicare Other | Admitting: *Deleted

## 2014-01-07 DIAGNOSIS — E538 Deficiency of other specified B group vitamins: Secondary | ICD-10-CM

## 2014-01-07 MED ORDER — CYANOCOBALAMIN 1000 MCG/ML IJ SOLN
1000.0000 ug | Freq: Once | INTRAMUSCULAR | Status: AC
  Administered 2014-01-07: 1000 ug via INTRAMUSCULAR

## 2014-01-07 NOTE — Progress Notes (Signed)
Pt came in for monthly Vit B12 injection.  Pt tolerated injection well.  Next injection in 1 month, and pt will schedule appt.//AB/CMA

## 2014-01-07 NOTE — Progress Notes (Signed)
Patient ID: Carly [name], female   DOB: [birth year], 68 y.o.   MRN: [id number]

## 2014-02-09 ENCOUNTER — Encounter: Payer: Self-pay | Admitting: Family Medicine

## 2014-02-09 ENCOUNTER — Other Ambulatory Visit: Payer: Self-pay | Admitting: General Practice

## 2014-02-09 ENCOUNTER — Ambulatory Visit (INDEPENDENT_AMBULATORY_CARE_PROVIDER_SITE_OTHER): Payer: Medicare Other | Admitting: General Practice

## 2014-02-09 ENCOUNTER — Ambulatory Visit (INDEPENDENT_AMBULATORY_CARE_PROVIDER_SITE_OTHER): Payer: Medicare Other | Admitting: Family Medicine

## 2014-02-09 VITALS — BP 136/82 | HR 54 | Temp 98.1°F | Resp 16 | Ht 64.5 in | Wt 184.1 lb

## 2014-02-09 DIAGNOSIS — Z1211 Encounter for screening for malignant neoplasm of colon: Secondary | ICD-10-CM

## 2014-02-09 DIAGNOSIS — Z1231 Encounter for screening mammogram for malignant neoplasm of breast: Secondary | ICD-10-CM

## 2014-02-09 DIAGNOSIS — I10 Essential (primary) hypertension: Secondary | ICD-10-CM

## 2014-02-09 DIAGNOSIS — E538 Deficiency of other specified B group vitamins: Secondary | ICD-10-CM

## 2014-02-09 DIAGNOSIS — Z0000 Encounter for general adult medical examination without abnormal findings: Secondary | ICD-10-CM

## 2014-02-09 DIAGNOSIS — G20 Parkinson's disease: Secondary | ICD-10-CM

## 2014-02-09 DIAGNOSIS — Z23 Encounter for immunization: Secondary | ICD-10-CM

## 2014-02-09 DIAGNOSIS — Z780 Asymptomatic menopausal state: Secondary | ICD-10-CM

## 2014-02-09 LAB — CBC WITH DIFFERENTIAL/PLATELET
Basophils Absolute: 0.1 10*3/uL (ref 0.0–0.1)
Basophils Relative: 0.9 % (ref 0.0–3.0)
EOS PCT: 1.5 % (ref 0.0–5.0)
Eosinophils Absolute: 0.1 10*3/uL (ref 0.0–0.7)
HEMATOCRIT: 38.9 % (ref 36.0–46.0)
Hemoglobin: 12.6 g/dL (ref 12.0–15.0)
LYMPHS ABS: 1.3 10*3/uL (ref 0.7–4.0)
Lymphocytes Relative: 21.6 % (ref 12.0–46.0)
MCHC: 32.5 g/dL (ref 30.0–36.0)
MCV: 91.6 fl (ref 78.0–100.0)
MONO ABS: 0.3 10*3/uL (ref 0.1–1.0)
Monocytes Relative: 4.9 % (ref 3.0–12.0)
Neutro Abs: 4.3 10*3/uL (ref 1.4–7.7)
Neutrophils Relative %: 71.1 % (ref 43.0–77.0)
Platelets: 144 10*3/uL — ABNORMAL LOW (ref 150.0–400.0)
RBC: 4.24 Mil/uL (ref 3.87–5.11)
RDW: 12.6 % (ref 11.5–15.5)
WBC: 6 10*3/uL (ref 4.0–10.5)

## 2014-02-09 LAB — BASIC METABOLIC PANEL
BUN: 19 mg/dL (ref 6–23)
CHLORIDE: 104 meq/L (ref 96–112)
CO2: 26 mEq/L (ref 19–32)
CREATININE: 1.2 mg/dL (ref 0.4–1.2)
Calcium: 9.3 mg/dL (ref 8.4–10.5)
GFR: 46.39 mL/min — ABNORMAL LOW (ref >60.00)
Glucose, Bld: 105 mg/dL — ABNORMAL HIGH (ref 70–99)
POTASSIUM: 4 meq/L (ref 3.5–5.1)
SODIUM: 140 meq/L (ref 135–145)

## 2014-02-09 LAB — LIPID PANEL
CHOL/HDL RATIO: 3
Cholesterol: 177 mg/dL (ref 0–200)
HDL: 54.5 mg/dL (ref >39.00)
LDL Cholesterol: 110 mg/dL — ABNORMAL HIGH (ref 0–99)
NonHDL: 122.5
Triglycerides: 62 mg/dL (ref 0.0–149.0)
VLDL: 12.4 mg/dL (ref 0.0–40.0)

## 2014-02-09 LAB — HEPATIC FUNCTION PANEL
ALK PHOS: 67 U/L (ref 39–117)
ALT: 19 U/L (ref 0–35)
AST: 20 U/L (ref 0–37)
Albumin: 3.5 g/dL (ref 3.5–5.2)
BILIRUBIN DIRECT: 0.1 mg/dL (ref 0.0–0.3)
BILIRUBIN TOTAL: 0.8 mg/dL (ref 0.2–1.2)
Total Protein: 7.2 g/dL (ref 6.0–8.3)

## 2014-02-09 LAB — VITAMIN B12: Vitamin B-12: 831 pg/mL (ref 211–911)

## 2014-02-09 LAB — TSH: TSH: 1.28 u[IU]/mL (ref 0.35–4.50)

## 2014-02-09 LAB — VITAMIN D 25 HYDROXY (VIT D DEFICIENCY, FRACTURES): VITD: 25.02 ng/mL — AB (ref 30.00–100.00)

## 2014-02-09 MED ORDER — VITAMIN D (ERGOCALCIFEROL) 1.25 MG (50000 UNIT) PO CAPS: 50000.0000 [IU] | ORAL_CAPSULE | ORAL | Status: DC

## 2014-02-09 NOTE — Progress Notes (Signed)
   Subjective:    Patient ID: Carly [name], female    DOB: December 17, 1945, 68 y.o.   MRN: [id number]  HPI Here today for CPE.  Risk Factors: HTN- chronic problem, on HCTZ daily Parkinson's- chronic problem, was following w/ [facility] but Dr [name] left.  Now looking for new Neuro.  On Pramipexole. B12 deficiency- pt has not had recent injxn.  Due for repeat labs to assess levels. Physical Activity: doing Tai Chi regularly, riding stationary bike Fall Risk: mild risk due to Parkinson's but no recent falls Depression: pt has more anxiety than depression.  Using Alprazolam prn.  Brother passed away on 06-27-22. Hearing: normal to conversational tones and whispered voice at 6 ft ADL's: independent Cognitive: normal linear thought process, memory and attention intact Home Safety: safe at home, lives w/ boyfriend Height, Weight, BMI, Visual Acuity: see vitals, vision corrected to 20/20 w/ glasses Counseling: due for colonoscopy, mammo, DEXA, Tdap, Pneumonia vaccine, flu shot Health Care POA/Living Will: pt has both set up and discussed w/ family Labs Ordered: See A&P Care Plan: See A&P    Review of Systems Patient reports no vision/ hearing changes, adenopathy,fever, weight change,  persistant/recurrent hoarseness , swallowing issues, chest pain, palpitations, edema, persistant/recurrent cough, hemoptysis, dyspnea (rest/exertional/paroxysmal nocturnal), gastrointestinal bleeding (melena, rectal bleeding), abdominal pain, significant heartburn, bowel changes, GU symptoms (dysuria, hematuria, incontinence), Gyn symptoms (abnormal  bleeding, pain),  syncope, focal weakness, memory loss, numbness & tingling, skin/hair/nail changes, abnormal bruising or bleeding, anxiety, or depression.     Objective:   Physical Exam General Appearance:    Alert, cooperative, no distress, appears stated age  Head:    Normocephalic, without obvious abnormality, atraumatic  Eyes:    PERRL, conjunctiva/corneas  clear, EOM's intact, fundi    benign, both eyes  Ears:    Normal TM's and external ear canals, both ears  Nose:   Nares normal, septum midline, mucosa normal, no drainage    or sinus tenderness  Throat:   Lips, mucosa, and tongue normal; teeth and gums normal  Neck:   Supple, symmetrical, trachea midline, no adenopathy;    Thyroid: no enlargement/tenderness/nodules  Back:     Symmetric, no curvature, ROM normal, no CVA tenderness  Lungs:     Clear to auscultation bilaterally, respirations unlabored  Chest Wall:    No tenderness or deformity   Heart:    Regular rate and rhythm, S1 and S2 normal, no murmur, rub   or gallop  Breast Exam:    Deferred to mammo  Abdomen:     Soft, non-tender, bowel sounds active all four quadrants,    no masses, no organomegaly  Genitalia:    Deferred  Rectal:    Extremities:   Extremities normal, atraumatic, no cyanosis or edema  Pulses:   2+ and symmetric all extremities  Skin:   Skin color, texture, turgor normal, no rashes or lesions  Lymph nodes:   Cervical, supraclavicular, and axillary nodes normal  Neurologic:   CNII-XII intact, normal strength, sensation and reflexes    Throughout.  R hand tremor          Assessment & Plan:

## 2014-02-09 NOTE — Progress Notes (Signed)
Pre visit review using our clinic review tool, if applicable. No additional management support is needed unless otherwise documented below in the visit note. 

## 2014-02-09 NOTE — Patient Instructions (Signed)
Follow up in 6 months to recheck BP We'll call you with your neurology appt We'll notify you of your lab results and make any changes if needed Keep up the good work on healthy diet and regular exercise We will be in touch about the mammogram, bone density and colonoscopy appts Call with any questions or concerns Happy Holidays!!

## 2014-02-10 ENCOUNTER — Telehealth: Payer: Self-pay | Admitting: Family Medicine

## 2014-02-10 NOTE — Telephone Encounter (Signed)
Pt notified of results again. Advised that B12 levels were normal it is her vitamin d that needs supplementation.

## 2014-02-10 NOTE — Assessment & Plan Note (Signed)
Pt has hx of this.  Due for labs.  Replete prn.

## 2014-02-10 NOTE — Assessment & Plan Note (Signed)
Chronic problem.  Currently asymptomatic.  Check labs.  No anticipated med changes. 

## 2014-02-10 NOTE — Assessment & Plan Note (Signed)
Pt's PE WNL w/ exception of known tremor.  Due for mammo, DEXA, colonoscopy.  Referrals entered.  Written screening schedule updated and given to pt.  Prevnar given.  Check labs.  Anticipatory guidance provided.

## 2014-02-10 NOTE — Assessment & Plan Note (Signed)
Chronic problem.  Pt reports tremor is increasing again despite medication.  Pt's previous Neuro left the practice.  Pt is in search of new provider.  Referral entered.  Will follow along.

## 2014-02-10 NOTE — Telephone Encounter (Signed)
Caller name: Carly SpragueBeverly Relation to pt: self Call back number: [phone] Pharmacy:  Reason for call:   Patient was wanting to know if she needs b12 inj or just the medication?

## 2014-02-18 ENCOUNTER — Encounter: Payer: Self-pay | Admitting: Neurology

## 2014-03-02 ENCOUNTER — Encounter: Payer: Self-pay | Admitting: Family Medicine

## 2014-03-09 ENCOUNTER — Ambulatory Visit (INDEPENDENT_AMBULATORY_CARE_PROVIDER_SITE_OTHER): Payer: Medicare Other | Admitting: Neurology

## 2014-03-09 ENCOUNTER — Encounter: Payer: Self-pay | Admitting: Neurology

## 2014-03-09 VITALS — BP 128/62 | HR 60 | Ht 65.0 in | Wt 185.0 lb

## 2014-03-09 DIAGNOSIS — G20 Parkinson's disease: Secondary | ICD-10-CM

## 2014-03-09 DIAGNOSIS — E538 Deficiency of other specified B group vitamins: Secondary | ICD-10-CM

## 2014-03-09 DIAGNOSIS — G4752 REM sleep behavior disorder: Secondary | ICD-10-CM

## 2014-03-09 NOTE — Patient Instructions (Signed)
1. Increase Mirapex as follows: Take 1.5 mg tablet in the morning, 1 mg tablet midday, 1 mg tablet at night for one week Take 1.5 mg tablet in the morning, 1.5 mg tablet midday, 1 mg tablet at night for one week Take 1.5 mg tablets three times daily

## 2014-03-09 NOTE — Progress Notes (Signed)
Carly EvertsBeverly B Blasing was seen today in the movement disorders clinic for neurologic consultation at the request of Neena RhymesKatherine [name], MD.  The consultation is for the evaluation of PD.  Pt is a former pt of Dr. Sandria ManlyLove and Dr. Hosie PoissonSumner.  I reviewed the prior records that were available to me.  The patient began to notice right hand tremor in 2009 with associated right arm stiffness.  She was initially started on selegiline and later pramipexole, which was worked up to 1 mg 3 times a day.  They did try Mirapex ER 3 mg for a short period of time, but her insurance quit paying for that.  Pt states that she loved it when she was on it once per day but it was just too expensive.  She went back to mirapex 1 mg tid.  She has trouble remembering the middle of the day mirapex but it reminds her with tremor and stiffness.   She was on Azilect, but it was also discontinued because of cost and it didn't help.  In July, 2014 carbidopa/levodopa 50/200 was added at night for cramping and RLS.  This helped, but the patient complained of some dizziness so this was decreased to carbidopa/levodopa 25/100 CR at night.  This was discontinued in May, 2015 as the patient's cramping seemed better and RLS was better.  Pt isn't sure that she even took it that long.  Pt is not currently exercising faithfully; in the past she was a marathon runner and was very competitive with sports.  She has never been to the PT program at the neurorehab center (lives between here and [place]).  Specific Symptoms:  Tremor: Yes.   (just R hand) Voice: hypophonic Sleep: sleeps well  Vivid Dreams:  Yes.    Acting out dreams:  Yes.   (screams) Wet Pillows: No. Postural symptoms:  Yes.   (but mild)  Falls?  No. Bradykinesia symptoms: difficulty getting out of a chair (if low or cushions) Loss of smell:  No. Loss of taste:  No. Urinary Incontinence:  No. Difficulty Swallowing:  No. Handwriting, micrographia: Yes.   Trouble with ADL's:  No. (just  minimal trouble and mostly with flossing; mild trouble putting on makeup on right side of face)  Trouble buttoning clothing: No. Depression:  No. Memory changes:  No. (perhaps very minimal; lost reading glasses and can't find now) Hallucinations:  No.  visual distortions: No. N/V:  No. Lightheaded:  No.  Syncope: No. Diplopia:  No. Dyskinesia:  No.   Patient also has a history of peripheral neuropathy.  She was identified to be B12 deficient and started injections in June, 2015.  PREVIOUS MEDICATIONS: Sinemet CR, Mirapex and azilect and selegeline  ALLERGIES:   Allergies  Allergen Reactions  . Codeine     REACTION: nausea    CURRENT MEDICATIONS:  Outpatient Encounter Prescriptions as of 03/09/2014  Medication Sig  . ALPRAZolam (XANAX) 0.25 MG tablet Take 1 tablet (0.25 mg total) by mouth 3 (three) times daily as needed for anxiety. For anxiety (Patient taking differently: Take 0.25 mg by mouth as needed for anxiety. For anxiety)  . aspirin EC 81 MG EC tablet Take 81 mg by mouth daily.    . Cholecalciferol (VITAMIN D PO) Take 5,000 mg by mouth daily.  . hydrochlorothiazide (MICROZIDE) 12.5 MG capsule TAKE 1 CAPSULE BY MOUTH ONCE DAILY  . pramipexole (MIRAPEX) 1 MG tablet Take 1 tablet (1 mg total) by mouth 3 (three) times daily.  . Vitamin D,  Ergocalciferol, (DRISDOL) 50000 UNITS CAPS capsule Take 1 capsule (50,000 Units total) by mouth every 7 (seven) days.    PAST MEDICAL HISTORY:   Past Medical History  Diagnosis Date  . Parkinson's disease   . Anxiety   . Hypertension   . Vitamin D deficiency     PAST SURGICAL HISTORY:   Past Surgical History  Procedure Laterality Date  . Refractive surgery Bilateral     SOCIAL HISTORY:   History   Social History  . Marital Status: Married    Spouse Name: N/A    Number of Children: 1  . Years of Education: N/A   Occupational History  . unempolyed     Social History Main Topics  . Smoking status: Never Smoker   .  Smokeless tobacco: Never Used  . Alcohol Use: No  . Drug Use: No  . Sexual Activity: Not on file   Other Topics Concern  . Not on file   Social History Narrative   Patient lives at home with partner Demetrios Lolllan Hart.    Patient has one adult child.    Patient has 14 years of education.    Patient does not work.   Caffeine consumption is 1 cup daily     FAMILY HISTORY:   Family Status  Relation Status Death Age  . Mother Deceased 4694    heart   . Father Deceased 4866    heart attack, smoker  . Brother Deceased     brain tumor  . Brother Deceased     bladder cancer, smoker, ETOH  . Sister Alive     stroke  . Sister Alive     healthy  . Son Alive     arthritis     ROS:  A complete 10 system review of systems was obtained and was unremarkable apart from what is mentioned above.  PHYSICAL EXAMINATION:    VITALS:   Filed Vitals:   03/09/14 1019  BP: 128/62  Pulse: 60  Height: 5\' 5"  (1.651 m)  Weight: 185 lb (83.915 kg)    GEN:  The patient appears stated age and is in NAD. HEENT:  Normocephalic, atraumatic.  The mucous membranes are moist. The superficial temporal arteries are without ropiness or tenderness. CV:  RRR Lungs:  CTAB Neck/HEME:  There are no carotid bruits bilaterally.  Neurological examination:  Orientation: The patient is alert and oriented x3. Fund of knowledge is appropriate.  Recent and remote memory are intact.  Attention and concentration are normal.    Able to name objects and repeat phrases. Cranial nerves: There is good facial symmetry. There is mild facial hypomimia.  Pupils are equal round and reactive to light bilaterally. Fundoscopic exam reveals clear margins bilaterally. Extraocular muscles are intact. There are no square wave jerks.  The visual fields are full to confrontational testing. The speech is fluent and clear. Soft palate rises symmetrically and there is no tongue deviation. Hearing is intact to conversational tone. Sensation: Sensation  is intact to light and pinprick throughout (facial, trunk, extremities). Vibration is intact at the bilateral big toe. There is no extinction with double simultaneous stimulation. There is no sensory dermatomal level identified. Motor: Strength is 5/5 in the bilateral upper and lower extremities.   Shoulder shrug is equal and symmetric.  There is no pronator drift. Deep tendon reflexes: Deep tendon reflexes are 3/4 at the bilateral biceps, triceps, brachioradialis, patella and achilles. Plantar responses are downgoing bilaterally.  Movement examination: Tone: There is mild to  moderate increased tone in the right upper extremity.  Tone in the left upper and bilateral lower extremities is normal.  Abnormal movements: There is a near constant mild right upper extremity resting tremor. Coordination:  There is minimal decremation with RAM's, seen most prominently with finger taps on the right. Gait and Station: The patient has no significant difficulty arising out of a deep-seated chair without the use of the hands. The patient's stride length is normal.  The patient has a negative pull test.      ASSESSMENT/PLAN:  1.  Idiopathic Parkinson's disease, diagnosed in 2009.  -We talked about pathophysiology, diagnosis and prognosis.  Greater than 50% of the 60 minute visit spent in counseling.  Talked about the value of safe, cardiovascular exercise.  Talked about the importance of getting back to this.  -We're going to slowly increase the Mirapex to 1.5 mg 3 times per day.  No evidence of compulsive behaviors.  Discuss this with her at length.  Risks, benefits, side effects and alternative therapies were discussed.  The opportunity to ask questions was given and they were answered to the best of my ability.  The patient expressed understanding and willingness to follow the outlined treatment protocols. 2.  REM behavior disorder.  -Overall mild.  Just consists of occasional yelling out at night.  She is to be  on clonazepam but is no longer.  We'll just keep an eye on this. 3.  History of B12 deficiency and peripheral neuropathy, per Dr. love/[facility] records.  -Did not see significant evidence of peripheral neuropathy on examination today, but this certainly could contribute to balance issues.  She really has no significant balance problems yet, however.  She will continue with B12 supplementation (on injections).  4.  Return in about 3 months (around 06/08/2014).

## 2014-05-04 ENCOUNTER — Ambulatory Visit: Payer: Medicare Other | Admitting: Nurse Practitioner

## 2014-05-04 ENCOUNTER — Ambulatory Visit: Payer: Medicare Other | Admitting: Neurology

## 2014-05-07 ENCOUNTER — Other Ambulatory Visit: Payer: Self-pay | Admitting: Family Medicine

## 2014-05-07 NOTE — Telephone Encounter (Signed)
Med denied, pt no longer needs.

## 2014-05-19 ENCOUNTER — Other Ambulatory Visit: Payer: Self-pay | Admitting: Family Medicine

## 2014-05-19 NOTE — Telephone Encounter (Signed)
pt needs to only take OTC vitamin D.

## 2014-06-08 ENCOUNTER — Ambulatory Visit (INDEPENDENT_AMBULATORY_CARE_PROVIDER_SITE_OTHER): Payer: Medicare Other | Admitting: Neurology

## 2014-06-08 ENCOUNTER — Encounter: Payer: Self-pay | Admitting: Neurology

## 2014-06-08 VITALS — BP 120/72 | HR 68 | Ht 65.0 in | Wt 187.0 lb

## 2014-06-08 DIAGNOSIS — E538 Deficiency of other specified B group vitamins: Secondary | ICD-10-CM

## 2014-06-08 DIAGNOSIS — G20 Parkinson's disease: Secondary | ICD-10-CM

## 2014-06-08 NOTE — Patient Instructions (Signed)
The hamil kerr challenge, the local walk/run for parkinsons is on July 25, 2014.  You can get the information for this on [email or web address].

## 2014-06-08 NOTE — Progress Notes (Signed)
Carly [name] was seen today in the movement disorders clinic for neurologic consultation at the request of [name], MD.  The consultation is for the evaluation of PD.  Pt is a former pt of [name] and [name].  I reviewed the prior records that were available to me.  The patient began to notice right hand tremor in 2009 with associated right arm stiffness.  She was initially started on selegiline and later pramipexole, which was worked up to 1 mg 3 times a day.  They did try Mirapex ER 3 mg for a short period of time, but her insurance quit paying for that.  Pt states that she loved it when she was on it once per day but it was just too expensive.  She went back to mirapex 1 mg tid.  She has trouble remembering the middle of the day mirapex but it reminds her with tremor and stiffness.   She was on Azilect, but it was also discontinued because of cost and it didn't help.  In July, 2014 carbidopa/levodopa 50/200 was added at night for cramping and RLS.  This helped, but the patient complained of some dizziness so this was decreased to carbidopa/levodopa 25/100 CR at night.  This was discontinued in May, 2015 as the patient's cramping seemed better and RLS was better.  Pt isn't sure that she even took it that long.  Pt is not currently exercising faithfully; in the past she was a marathon runner and was very competitive with sports.  She has never been to the PT program at the neuro[facility]hab center (lives between here and [place]).  06/08/14 update:  Pt has a hx of PD and last visit I increased her mirapex to 1.5 mg tid.  Going up on the dosage did help.   Pt has been under a lot of stress.  Her partner of 15 years found out he has prostate CA and some friends have died.  She has noted that stress increases tremor.  She moved a piece of heavy furniture after we last met and her L sciatica flared and she just got better and then she moved something again and injured her right groin.  This has  prevented exercise.  No lightheadness/near syncope.    Patient also has a history of peripheral neuropathy.  She was identified to be B12 deficient and started injections in June, 2015.  PREVIOUS MEDICATIONS: Sinemet CR, Mirapex and azilect and selegeline  ALLERGIES:   Allergies  Allergen Reactions  . Codeine     REACTION: nausea    CURRENT MEDICATIONS:  Outpatient Encounter Prescriptions as of 06/08/2014  Medication Sig  . ALPRAZolam (XANAX) 0.25 MG tablet Take 1 tablet (0.25 mg total) by mouth 3 (three) times daily as needed for anxiety. For anxiety (Patient taking differently: Take 0.25 mg by mouth as needed for anxiety. For anxiety)  . aspirin EC 81 MG EC tablet Take 81 mg by mouth daily.    . hydrochlorothiazide (MICROZIDE) 12.5 MG capsule TAKE 1 CAPSULE BY MOUTH ONCE DAILY  . pramipexole (MIRAPEX) 1 MG tablet Take 1.5 mg by mouth 3 (three) times daily.   . Vitamin D, Ergocalciferol, (DRISDOL) 50000 UNITS CAPS capsule Take 1 capsule (50,000 Units total) by mouth every 7 (seven) days.  . [DISCONTINUED] Cholecalciferol (VITAMIN D PO) Take 5,000 mg by mouth daily.    PAST MEDICAL HISTORY:   Past Medical History  Diagnosis Date  . Parkinson's disease   . Anxiety   .  Hypertension   . Vitamin D deficiency     PAST SURGICAL HISTORY:   Past Surgical History  Procedure Laterality Date  . Refractive surgery Bilateral     SOCIAL HISTORY:   History   Social History  . Marital Status: Married    Spouse Name: N/A  . Number of Children: 1  . Years of Education: N/A   Occupational History  . unempolyed     Social History Main Topics  . Smoking status: Never Smoker   . Smokeless tobacco: Never Used  . Alcohol Use: No  . Drug Use: No  . Sexual Activity: Not on file   Other Topics Concern  . Not on file   Social History Narrative   Patient lives at home with partner [name].    Patient has one adult child.    Patient has 14 years of education.    Patient does not  work.   Caffeine consumption is 1 cup daily     FAMILY HISTORY:   Family Status  Relation Status Death Age  . Mother Deceased 60    heart   . Father Deceased 38    heart attack, smoker  . Brother Deceased     brain tumor  . Brother Deceased     bladder cancer, smoker, ETOH  . Sister Alive     stroke  . Sister Alive     healthy  . Son Alive     arthritis     ROS:  A complete 10 system review of systems was obtained and was unremarkable apart from what is mentioned above.  PHYSICAL EXAMINATION:    VITALS:   Filed Vitals:   06/08/14 1107  BP: 120/72  Pulse: 68  Height: '5\' 5"'  (1.651 m)  Weight: 187 lb (84.823 kg)    GEN:  The patient appears stated age and is in NAD. HEENT:  Normocephalic, atraumatic.  The mucous membranes are moist. The superficial temporal arteries are without ropiness or tenderness. CV:  RRR Lungs:  CTAB Neck/HEME:  There are no carotid bruits bilaterally.  Neurological examination:  Orientation: The patient is alert and oriented x3. Fund of knowledge is appropriate.  Recent and remote memory are intact.  Attention and concentration are normal.    Able to name objects and repeat phrases. Cranial nerves: There is good facial symmetry. There is mild facial hypomimia.  Pupils are equal round and reactive to light bilaterally. Fundoscopic exam reveals clear margins bilaterally. Extraocular muscles are intact. There are no square wave jerks.  The visual fields are full to confrontational testing. The speech is fluent and clear. Soft palate rises symmetrically and there is no tongue deviation. Hearing is intact to conversational tone. Sensation: Sensation is intact to light and pinprick throughout (facial, trunk, extremities). Vibration is intact at the bilateral big toe. There is no extinction with double simultaneous stimulation. There is no sensory dermatomal level identified. Motor: Strength is 5/5 in the bilateral upper and lower extremities.   Shoulder  shrug is equal and symmetric.  There is no pronator drift. Deep tendon reflexes: Deep tendon reflexes are 3/4 at the bilateral biceps, triceps, brachioradialis, patella and achilles. Plantar responses are downgoing bilaterally.  Movement examination: Tone: There is mild to moderate increased tone in the right upper extremity.  Tone in the left upper extremity is mildly increased but the bilateral lower extremities is normal.  Abnormal movements: There is an intermittent mild right upper extremity resting tremor. Coordination:  There is minimal decremation  with RAM's, seen most prominently with finger taps on the right. Gait and Station: The patient has no significant difficulty arising out of a deep-seated chair without the use of the hands. The patient's stride length is normal.  The patient has a negative pull test.      ASSESSMENT/PLAN:  1.  Idiopathic Parkinson's disease, diagnosed in 2009.  -She did look underdosed today, but was seen at the very end of the dose and was due for a next dose.  Therefore, I did not change her medication.  She will remain on Mirapex, 1.5 mg 3 times per day.  -We again talked about the importance of safe, cardiovascular exercise.  Greater than 50% of the 25 minute visit was spent in counseling. 2.  REM behavior disorder.  -Overall mild.  Just consists of occasional yelling out at night.  She is to be on clonazepam but is no longer.  We'll just keep an eye on this. 3.  History of B12 deficiency and peripheral neuropathy, per Dr. love/[facility] records.  -Did not see significant evidence of peripheral neuropathy on examination today, but this certainly could contribute to balance issues.  She really has no significant balance problems yet, however.  She will continue with B12 supplementation (on injections).  4.  Return in about 3 months (around 09/08/2014).

## 2014-06-16 ENCOUNTER — Other Ambulatory Visit: Payer: Self-pay | Admitting: Family Medicine

## 2014-06-16 NOTE — Telephone Encounter (Signed)
Med denied, pt only needed to take for 12 weeks.

## 2014-06-17 ENCOUNTER — Other Ambulatory Visit: Payer: Self-pay | Admitting: General Practice

## 2014-06-17 MED ORDER — HYDROCHLOROTHIAZIDE 12.5 MG PO CAPS: ORAL_CAPSULE | ORAL | Status: DC

## 2014-06-29 ENCOUNTER — Other Ambulatory Visit: Payer: Self-pay | Admitting: Neurology

## 2014-06-30 ENCOUNTER — Telehealth: Payer: Self-pay | Admitting: Neurology

## 2014-06-30 NOTE — Telephone Encounter (Signed)
Prescription sent with change in medication to 1.5 tablets per last office note.

## 2014-06-30 NOTE — Telephone Encounter (Signed)
Patient made aware request received from [facility] this morning and RX sent to pharmacy.

## 2014-06-30 NOTE — Telephone Encounter (Signed)
Pt states that she needs a refill on mirapax called into the [facility] pt phone number is [phone]

## 2014-08-12 ENCOUNTER — Encounter: Payer: Self-pay | Admitting: Family Medicine

## 2014-08-12 ENCOUNTER — Ambulatory Visit (INDEPENDENT_AMBULATORY_CARE_PROVIDER_SITE_OTHER): Payer: Medicare Other | Admitting: Family Medicine

## 2014-08-12 ENCOUNTER — Ambulatory Visit: Payer: Medicare Other | Admitting: Family Medicine

## 2014-08-12 VITALS — BP 130/82 | HR 57 | Temp 98.1°F | Resp 16 | Wt 187.2 lb

## 2014-08-12 DIAGNOSIS — I10 Essential (primary) hypertension: Secondary | ICD-10-CM | POA: Diagnosis not present

## 2014-08-12 LAB — BASIC METABOLIC PANEL
BUN: 20 mg/dL (ref 6–23)
CALCIUM: 9.8 mg/dL (ref 8.4–10.5)
CO2: 32 meq/L (ref 19–32)
Chloride: 102 mEq/L (ref 96–112)
Creatinine, Ser: 1.21 mg/dL — ABNORMAL HIGH (ref 0.40–1.20)
GFR: 47.2 mL/min — AB (ref >60.00)
GLUCOSE: 108 mg/dL — AB (ref 70–99)
POTASSIUM: 4.8 meq/L (ref 3.5–5.1)
SODIUM: 138 meq/L (ref 135–145)

## 2014-08-12 NOTE — Progress Notes (Signed)
Pre visit review using our clinic review tool, if applicable. No additional management support is needed unless otherwise documented below in the visit note. 

## 2014-08-12 NOTE — Patient Instructions (Signed)
Schedule your complete physical in 6 months We'll notify you of your lab results and make any changes if needed Keep up the good work!  You look great! Call with any questions or concerns Hang in there!!

## 2014-08-12 NOTE — Progress Notes (Signed)
   Subjective:    Patient ID: Carly [name], female    DOB: 09-13-1945, 69 y.o.   MRN: [id number]  HPI HTN- chronic problem, on HCTZ.  Well controlled.  Some swelling of feet and ankles w/ heat or activity.  No CP, SOB, HAs, visual changes.  Riding stationary bike for exercise.   Review of Systems For ROS see HPI     Objective:   Physical Exam  Constitutional: She is oriented to person, place, and time. She appears well-developed and well-nourished. No distress.  HENT:  Head: Normocephalic and atraumatic.  Eyes: Conjunctivae and EOM are normal. Pupils are equal, round, and reactive to light.  Neck: Normal range of motion. Neck supple. No thyromegaly present.  Cardiovascular: Normal rate, regular rhythm, normal heart sounds and intact distal pulses.   No murmur heard. Pulmonary/Chest: Effort normal and breath sounds normal. No respiratory distress.  Abdominal: Soft. She exhibits no distension. There is no tenderness.  Musculoskeletal: She exhibits edema (trace edema of bilateral LEs).  Lymphadenopathy:    She has no cervical adenopathy.  Neurological: She is alert and oriented to person, place, and time.  Skin: Skin is warm and dry.  Psychiatric: She has a normal mood and affect. Her behavior is normal.  Vitals reviewed.         Assessment & Plan:

## 2014-08-12 NOTE — Assessment & Plan Note (Signed)
Chronic problem.  Adequate control.  Asymptomatic w/ exception of LE edema (pt admits to increased salt recently).  Check labs.  No anticipated med changes.

## 2014-09-08 ENCOUNTER — Ambulatory Visit (INDEPENDENT_AMBULATORY_CARE_PROVIDER_SITE_OTHER): Payer: Medicare Other | Admitting: Neurology

## 2014-09-08 ENCOUNTER — Encounter: Payer: Self-pay | Admitting: Neurology

## 2014-09-08 VITALS — BP 102/62 | HR 72 | Ht 65.0 in | Wt 191.0 lb

## 2014-09-08 DIAGNOSIS — E538 Deficiency of other specified B group vitamins: Secondary | ICD-10-CM

## 2014-09-08 DIAGNOSIS — G20 Parkinson's disease: Secondary | ICD-10-CM

## 2014-09-08 MED ORDER — CARBIDOPA-LEVODOPA 25-100 MG PO TABS: 1.0000 | ORAL_TABLET | Freq: Three times a day (TID) | ORAL | Status: DC

## 2014-09-08 NOTE — Patient Instructions (Signed)
1.  Start carbidopa/levodopa 25/100 as follows:  1/2 tab three times a day before meals x 1 wk, then 1/2 in am & noon & 1 in evening for a week, then 1/2 in am &1 at noon &one in evening for a week, then 1 tablet three times a day before meals 2.  Continue pramipexole as you have been previously - 1.5 mg three times per day

## 2014-09-08 NOTE — Progress Notes (Signed)
Carly [name] was seen today in the movement disorders clinic for neurologic consultation at the request of Carly Asa, MD.  The consultation is for the evaluation of PD.  Pt is a former pt of Dr. Erling [name] and Dr. Janann [name].  I reviewed the prior records that were available to me.  The patient began to notice right hand tremor in 2009 with associated right arm stiffness.  She was initially started on selegiline and later pramipexole, which was worked up to 1 mg 3 times a day.  They did try Mirapex ER 3 mg for a short period of time, but her insurance quit paying for that.  Pt states that she loved it when she was on it once per day but it was just too expensive.  She went back to mirapex 1 mg tid.  She has trouble remembering the middle of the day mirapex but it reminds her with tremor and stiffness.   She was on Azilect, but it was also discontinued because of cost and it didn't help.  In July, 2014 carbidopa/levodopa 50/200 was added at night for cramping and RLS.  This helped, but the patient complained of some dizziness so this was decreased to carbidopa/levodopa 25/100 CR at night.  This was discontinued in May, 2015 as the patient's cramping seemed better and RLS was better.  Pt isn't sure that she even took it that long.  Pt is not currently exercising faithfully; in the past she was a marathon runner and was very competitive with sports.  She has never been to the PT program at the [facility] (lives between here and [place]).  06/08/14 update:  Pt has a hx of PD and last visit I increased her mirapex to 1.5 mg tid.  Going up on the dosage did help.   Pt has been under a lot of stress.  Her partner of 15 years found out he has prostate CA and some friends have died.  She has noted that stress increases tremor.  She moved a piece of heavy furniture after we last met and her L sciatica flared and she just got better and then she moved something again and injured her right groin.  This has  prevented exercise.  No lightheadness/near syncope.    09/08/14 update:  Pt returns for follow up.  She is on mirapex 1.5 mg tid.  She is c/o more freezing.  She states that she can get up and turn to the right but when trying to turn to the left, she has start hesitation/freezing.  No falls.  No lightheadness/near syncope.  She is walking and riding her bike some.    Patient also has a history of peripheral neuropathy.  She was identified to be B12 deficient and started injections in June, 2015.  PREVIOUS MEDICATIONS: Sinemet CR, Mirapex and azilect and selegeline  ALLERGIES:   Allergies  Allergen Reactions  . Codeine     REACTION: nausea    CURRENT MEDICATIONS:  Outpatient Encounter Prescriptions as of 09/08/2014  Medication Sig  . ALPRAZolam (XANAX) 0.25 MG tablet Take 0.25 mg by mouth as needed for anxiety.   . Cholecalciferol (VITAMIN D3) 5000 UNITS CAPS Take by mouth.  . hydrochlorothiazide (MICROZIDE) 12.5 MG capsule TAKE 1 CAPSULE BY MOUTH ONCE DAILY  . polyethylene glycol (MIRALAX / GLYCOLAX) packet Take 17 g by mouth daily as needed.  . pramipexole (MIRAPEX) 1 MG tablet Take 1.5 mg by mouth 3 (three) times daily.   . carbidopa-levodopa (SINEMET  IR) 25-100 MG per tablet Take 1 tablet by mouth 3 (three) times daily.   No facility-administered encounter medications on file as of 09/08/2014.    PAST MEDICAL HISTORY:   Past Medical History  Diagnosis Date  . Parkinson's disease   . Anxiety   . Hypertension   . Vitamin D deficiency     PAST SURGICAL HISTORY:   Past Surgical History  Procedure Laterality Date  . Refractive surgery Bilateral     SOCIAL HISTORY:   History   Social History  . Marital Status: Married    Spouse Name: N/A  . Number of Children: 1  . Years of Education: N/A   Occupational History  . unempolyed     Social History Main Topics  . Smoking status: Never Smoker   . Smokeless tobacco: Never Used  . Alcohol Use: No  . Drug Use: No  . Sexual  Activity: Not on file   Other Topics Concern  . Not on file   Social History Narrative   Patient lives at home with partner Carly [name].    Patient has one adult child.    Patient has 14 years of education.    Patient does not work.   Caffeine consumption is 1 cup daily     FAMILY HISTORY:   Family Status  Relation Status Death Age  . Mother Deceased 62    heart   . Father Deceased 62    heart attack, smoker  . Brother Deceased     brain tumor  . Brother Deceased     bladder cancer, smoker, ETOH  . Sister Alive     stroke  . Sister Alive     healthy  . Son Alive     arthritis     ROS:  A complete 10 system review of systems was obtained and was unremarkable apart from what is mentioned above.  PHYSICAL EXAMINATION:    VITALS:   Filed Vitals:   09/08/14 1047  BP: 102/62  Pulse: 72  Height: '5\' 5"'  (1.651 m)  Weight: 191 lb (86.637 kg)    GEN:  The patient appears stated age and is in NAD. HEENT:  Normocephalic, atraumatic.  The mucous membranes are moist. The superficial temporal arteries are without ropiness or tenderness. CV:  RRR w 3/6 sem Lungs:  CTAB Neck/HEME:  There are no carotid bruits bilaterally.  Neurological examination:  Orientation: The patient is alert and oriented x3. Fund of knowledge is appropriate.  Recent and remote memory are intact.  Attention and concentration are normal.    Able to name objects and repeat phrases. Cranial nerves: There is good facial symmetry. There is mild facial hypomimia.  There are no square wave jerks.  The visual fields are full to confrontational testing. The speech is fluent and clear. Soft palate rises symmetrically and there is no tongue deviation. Hearing is intact to conversational tone. Sensation: Sensation is intact to light touch throughout. Motor: Strength is 5/5 in the bilateral upper and lower extremities.   Shoulder shrug is equal and symmetric.  There is no pronator drift.   Movement  examination: Tone: There is moderate-severe increased tone in the right upper extremity.  Tone in the left upper extremity is mildly increased but the bilateral lower extremities is normal.  Abnormal movements: There is an intermittent mild right upper extremity resting tremor that increases with distraction. Coordination:  There is minimal decremation with RAM's, seen most prominently with finger taps on the right. Gait  and Station: The patient has no significant difficulty arising out of a deep-seated chair without the use of the hands. She has decreased arm swing on the right.  The patient's stride length is normal.    ASSESSMENT/PLAN:  1.  Idiopathic Parkinson's disease, diagnosed in 2009.  -She will remain on Mirapex, 1.5 mg 3 times per day.  -After quite some discussion, we decided to add carbidopa/levodopa 25/100, one tablet 3 times per day.  Risks, benefits, side effects and alternative therapies were discussed.  The opportunity to ask questions was given and they were answered to the best of my ability.  The patient expressed understanding and willingness to follow the outlined treatment protocols.  -We again talked about the importance of safe, cardiovascular exercise.    -We did discuss safety.  Greater than 50% of the 25 minute visit was spent in counseling. 2.  REM behavior disorder.  -Overall mild.  Just consists of occasional yelling out at night.  She is to be on clonazepam but is no longer.  We'll just keep an eye on this. 3.  History of B12 deficiency and peripheral neuropathy, per Dr. love/[facility] records.  -Did not see significant evidence of peripheral neuropathy on examination today, but this certainly could contribute to balance issues.  She really has no significant balance problems yet, however.  She will continue with B12 supplementation (on injections).  4.  Return in about 3 months (around 12/09/2014).

## 2014-11-27 ENCOUNTER — Ambulatory Visit (INDEPENDENT_AMBULATORY_CARE_PROVIDER_SITE_OTHER): Payer: Medicare Other | Admitting: Neurology

## 2014-11-27 VITALS — BP 120/72 | HR 62 | Ht 65.0 in | Wt 189.0 lb

## 2014-11-27 DIAGNOSIS — G4752 REM sleep behavior disorder: Secondary | ICD-10-CM | POA: Diagnosis not present

## 2014-11-27 DIAGNOSIS — G20 Parkinson's disease: Secondary | ICD-10-CM | POA: Diagnosis not present

## 2014-11-27 DIAGNOSIS — E538 Deficiency of other specified B group vitamins: Secondary | ICD-10-CM

## 2014-11-27 NOTE — Progress Notes (Signed)
Carly [name] was seen today in the movement disorders clinic for neurologic consultation at the request of Carly Asa, MD.  The consultation is for the evaluation of PD.  Pt is a former pt of Dr. Erling [name] and Dr. Janann [name].  I reviewed the prior records that were available to me.  The patient began to notice right hand tremor in 2009 with associated right arm stiffness.  She was initially started on selegiline and later pramipexole, which was worked up to 1 mg 3 times a day.  They did try Mirapex ER 3 mg for a short period of time, but her insurance quit paying for that.  Pt states that she loved it when she was on it once per day but it was just too expensive.  She went back to mirapex 1 mg tid.  She has trouble remembering the middle of the day mirapex but it reminds her with tremor and stiffness.   She was on Azilect, but it was also discontinued because of cost and it didn't help.  In July, 2014 carbidopa/levodopa 50/200 was added at night for cramping and RLS.  This helped, but the patient complained of some dizziness so this was decreased to carbidopa/levodopa 25/100 CR at night.  This was discontinued in May, 2015 as the patient's cramping seemed better and RLS was better.  Pt isn't sure that she even took it that long.  Pt is not currently exercising faithfully; in the past she was a marathon runner and was very competitive with sports.  She has never been to the PT program at the [facility] center (lives between here and [place]).  06/08/14 update:  Pt has a hx of PD and last visit I increased her mirapex to 1.5 mg tid.  Going up on the dosage did help.   Pt has been under a lot of stress.  Her partner of 15 years found out he has prostate CA and some friends have died.  She has noted that stress increases tremor.  She moved a piece of heavy furniture after we last met and her L sciatica flared and she just got better and then she moved something again and injured her right groin.  This has  prevented exercise.  No lightheadness/near syncope.    09/08/14 update:  Pt returns for follow up.  She is on mirapex 1.5 mg tid.  She is c/o more freezing.  She states that she can get up and turn to the right but when trying to turn to the left, she has start hesitation/freezing.  No falls.  No lightheadness/near syncope.  She is walking and riding her bike some.    11/27/14 update:  Pt is on mirapex 1.5 tid and last visit I started her on carbidopa/levodopa 25/100 tid.  She is no longer having freezing spells and states that she is doing better than she expected.  She exercises some.  She is talking and dreaming more than she was; she is not falling out of bed.  States that her best friends husband just died and she thinks that caused more of the dreams.  Patient also has a history of peripheral neuropathy.  She was identified to be B12 deficient and started injections in June, 2015.  PREVIOUS MEDICATIONS: Sinemet CR, Mirapex and azilect and selegeline  ALLERGIES:   Allergies  Allergen Reactions  . Codeine     REACTION: nausea    CURRENT MEDICATIONS:  Outpatient Encounter Prescriptions as of 11/27/2014  Medication Sig  .  ALPRAZolam (XANAX) 0.25 MG tablet Take 0.25 mg by mouth as needed for anxiety.   . carbidopa-levodopa (SINEMET IR) 25-100 MG per tablet Take 1 tablet by mouth 3 (three) times daily.  . Cholecalciferol (VITAMIN D3) 5000 UNITS CAPS Take by mouth.  . hydrochlorothiazide (MICROZIDE) 12.5 MG capsule TAKE 1 CAPSULE BY MOUTH ONCE DAILY  . polyethylene glycol (MIRALAX / GLYCOLAX) packet Take 17 g by mouth daily as needed.  . pramipexole (MIRAPEX) 1 MG tablet Take 1.5 mg by mouth 3 (three) times daily.    No facility-administered encounter medications on file as of 11/27/2014.    PAST MEDICAL HISTORY:   Past Medical History  Diagnosis Date  . Parkinson's disease   . Anxiety   . Hypertension   . Vitamin D deficiency     PAST SURGICAL HISTORY:   Past Surgical History    Procedure Laterality Date  . Refractive surgery Bilateral     SOCIAL HISTORY:   Social History   Social History  . Marital Status: Married    Spouse Name: N/A  . Number of Children: 1  . Years of Education: N/A   Occupational History  . unempolyed     Social History Main Topics  . Smoking status: Never Smoker   . Smokeless tobacco: Never Used  . Alcohol Use: No  . Drug Use: No  . Sexual Activity: Not on file   Other Topics Concern  . Not on file   Social History Narrative   Patient lives at home with partner Carly [name].    Patient has one adult child.    Patient has 14 years of education.    Patient does not work.   Caffeine consumption is 1 cup daily     FAMILY HISTORY:   Family Status  Relation Status Death Age  . Mother Deceased 23    heart   . Father Deceased 25    heart attack, smoker  . Brother Deceased     brain tumor  . Brother Deceased     bladder cancer, smoker, ETOH  . Sister Alive     stroke  . Sister Alive     healthy  . Son Alive     arthritis     ROS:  A complete 10 system review of systems was obtained and was unremarkable apart from what is mentioned above.  PHYSICAL EXAMINATION:    VITALS:   Filed Vitals:   11/27/14 1011  BP: 120/72  Pulse: 62  Height: '5\' 5"'  (1.651 m)  Weight: 189 lb (85.73 kg)    GEN:  The patient appears stated age and is in NAD. HEENT:  Normocephalic, atraumatic.  The mucous membranes are moist. The superficial temporal arteries are without ropiness or tenderness. CV:  RRR w 3/6 sem Lungs:  CTAB Neck/HEME:  There are no carotid bruits bilaterally.  Neurological examination:  Orientation: The patient is alert and oriented x3. Fund of knowledge is appropriate.  Recent and remote memory are intact.  Attention and concentration are normal.    Able to name objects and repeat phrases. Cranial nerves: There is good facial symmetry. There is mild facial hypomimia.  There are no square wave jerks.  The visual  fields are full to confrontational testing. The speech is fluent and clear. Soft palate rises symmetrically and there is no tongue deviation. Hearing is intact to conversational tone. Sensation: Sensation is intact to light touch throughout. Motor: Strength is 5/5 in the bilateral upper and lower extremities.  Shoulder shrug is equal and symmetric.  There is no pronator drift.   Movement examination: Tone: There is mildly increased in the RUE; Tone elsewhere is normal Abnormal movements: There is no resting tremor today at all Coordination:  There is good RAM's today Gait and Station: The patient has no significant difficulty arising out of a deep-seated chair without the use of the hands. She has normal arm swing bilaterally today.  The patient's stride length is normal.  Negative pull test.  Labs  Lab Results  Component Value Date   VITAMINB12 831 02/09/2014     ASSESSMENT/PLAN:  1.  Idiopathic Parkinson's disease, diagnosed in 2009.  -She will remain on Mirapex, 1.5 mg 3 times per day.  -continue on the carbidopa/levodopa 25/100 tid.  Looks much better with this addition.    -We again talked about the importance of safe, cardiovascular exercise.    -We did discuss safety.  Greater than 50% of the 25 minute visit was spent in counseling again 2.  REM behavior disorder.  -Overall mild.  Just consists of occasional yelling out at night.  She is to be on clonazepam but is no longer.  We'll just keep an eye on this.  Doesn't want to add medication 3.  History of B12 deficiency and peripheral neuropathy, per Dr. love/[facility] records.  -Did not see significant evidence of peripheral neuropathy on examination today, but this certainly could contribute to balance issues.  She really has no significant balance problems yet, however.  She is off of injections now but on B12 orally 4.  Return in about 3 months (around 02/27/2015).

## 2014-12-09 ENCOUNTER — Ambulatory Visit: Payer: Medicare Other | Admitting: Neurology

## 2015-01-03 ENCOUNTER — Other Ambulatory Visit: Payer: Self-pay | Admitting: Family Medicine

## 2015-01-05 NOTE — Telephone Encounter (Signed)
Medication filled to pharmacy as requested.   

## 2015-01-28 ENCOUNTER — Other Ambulatory Visit: Payer: Self-pay | Admitting: Neurology

## 2015-01-29 NOTE — Telephone Encounter (Signed)
Medication available OTC or by PCP.

## 2015-02-02 ENCOUNTER — Other Ambulatory Visit: Payer: Self-pay | Admitting: Neurology

## 2015-02-02 NOTE — Telephone Encounter (Signed)
Mirapex refill requested. Per last office note- patient to remain on medication. Refill approved and sent to patient's pharmacy.   

## 2015-02-12 ENCOUNTER — Telehealth: Payer: Self-pay | Admitting: Behavioral Health

## 2015-02-12 ENCOUNTER — Encounter: Payer: Self-pay | Admitting: Behavioral Health

## 2015-02-12 NOTE — Telephone Encounter (Signed)
Pre-Visit Call completed with patient and chart updated.   Pre-Visit Info documented in Specialty Comments under [employer].    

## 2015-02-15 ENCOUNTER — Encounter: Payer: Self-pay | Admitting: Family Medicine

## 2015-02-15 ENCOUNTER — Encounter (INDEPENDENT_AMBULATORY_CARE_PROVIDER_SITE_OTHER): Payer: Self-pay

## 2015-02-15 ENCOUNTER — Ambulatory Visit (INDEPENDENT_AMBULATORY_CARE_PROVIDER_SITE_OTHER): Payer: Medicare Other | Admitting: Family Medicine

## 2015-02-15 VITALS — BP 124/80 | HR 86 | Temp 97.9°F | Resp 16 | Ht 65.0 in | Wt 186.4 lb

## 2015-02-15 DIAGNOSIS — Z23 Encounter for immunization: Secondary | ICD-10-CM

## 2015-02-15 DIAGNOSIS — M8580 Other specified disorders of bone density and structure, unspecified site: Secondary | ICD-10-CM

## 2015-02-15 DIAGNOSIS — Z1231 Encounter for screening mammogram for malignant neoplasm of breast: Secondary | ICD-10-CM

## 2015-02-15 DIAGNOSIS — F411 Generalized anxiety disorder: Secondary | ICD-10-CM

## 2015-02-15 DIAGNOSIS — E669 Obesity, unspecified: Secondary | ICD-10-CM

## 2015-02-15 DIAGNOSIS — Z0000 Encounter for general adult medical examination without abnormal findings: Secondary | ICD-10-CM | POA: Diagnosis not present

## 2015-02-15 DIAGNOSIS — G20 Parkinson's disease: Secondary | ICD-10-CM

## 2015-02-15 DIAGNOSIS — I10 Essential (primary) hypertension: Secondary | ICD-10-CM

## 2015-02-15 HISTORY — DX: Other specified disorders of bone density and structure, unspecified site: M85.80

## 2015-02-15 LAB — CBC WITH DIFFERENTIAL/PLATELET
BASOS ABS: 0 10*3/uL (ref 0.0–0.1)
BASOS PCT: 0.6 % (ref 0.0–3.0)
EOS ABS: 0 10*3/uL (ref 0.0–0.7)
Eosinophils Relative: 0.6 % (ref 0.0–5.0)
HCT: 39.1 % (ref 36.0–46.0)
Hemoglobin: 13.1 g/dL (ref 12.0–15.0)
Lymphocytes Relative: 21.9 % (ref 12.0–46.0)
Lymphs Abs: 1.5 10*3/uL (ref 0.7–4.0)
MCHC: 33.4 g/dL (ref 30.0–36.0)
MCV: 90.9 fl (ref 78.0–100.0)
MONO ABS: 0.4 10*3/uL (ref 0.1–1.0)
Monocytes Relative: 5.4 % (ref 3.0–12.0)
NEUTROS ABS: 5.1 10*3/uL (ref 1.4–7.7)
Neutrophils Relative %: 71.5 % (ref 43.0–77.0)
PLATELETS: 170 10*3/uL (ref 150.0–400.0)
RBC: 4.3 Mil/uL (ref 3.87–5.11)
RDW: 12.3 % (ref 11.5–15.5)
WBC: 7.1 10*3/uL (ref 4.0–10.5)

## 2015-02-15 LAB — BASIC METABOLIC PANEL
BUN: 19 mg/dL (ref 6–23)
CALCIUM: 10.1 mg/dL (ref 8.4–10.5)
CHLORIDE: 100 meq/L (ref 96–112)
CO2: 30 meq/L (ref 19–32)
CREATININE: 1.23 mg/dL — AB (ref 0.40–1.20)
GFR: 46.25 mL/min — ABNORMAL LOW (ref >60.00)
GLUCOSE: 101 mg/dL — AB (ref 70–99)
Potassium: 4.5 mEq/L (ref 3.5–5.1)
Sodium: 140 mEq/L (ref 135–145)

## 2015-02-15 LAB — LIPID PANEL
CHOLESTEROL: 172 mg/dL (ref 0–200)
HDL: 60.2 mg/dL (ref >39.00)
LDL Cholesterol: 100 mg/dL — ABNORMAL HIGH (ref 0–99)
NONHDL: 112.08
Total CHOL/HDL Ratio: 3
Triglycerides: 62 mg/dL (ref 0.0–149.0)
VLDL: 12.4 mg/dL (ref 0.0–40.0)

## 2015-02-15 LAB — VITAMIN D 25 HYDROXY (VIT D DEFICIENCY, FRACTURES): VITD: 73.8 ng/mL (ref 30.00–100.00)

## 2015-02-15 LAB — HEPATIC FUNCTION PANEL
ALT: 4 U/L (ref 0–35)
AST: 17 U/L (ref 0–37)
Albumin: 4.2 g/dL (ref 3.5–5.2)
Alkaline Phosphatase: 82 U/L (ref 39–117)
BILIRUBIN DIRECT: 0.2 mg/dL (ref 0.0–0.3)
BILIRUBIN TOTAL: 0.8 mg/dL (ref 0.2–1.2)
TOTAL PROTEIN: 7.5 g/dL (ref 6.0–8.3)

## 2015-02-15 LAB — TSH: TSH: 1.46 u[IU]/mL (ref 0.35–4.50)

## 2015-02-15 MED ORDER — ALPRAZOLAM 0.25 MG PO TABS: 0.2500 mg | ORAL_TABLET | ORAL | Status: DC | PRN

## 2015-02-15 NOTE — Assessment & Plan Note (Signed)
Ongoing issue for pt.  She is riding stationary bike for exercise.  Encouraged healthy food choices.  Check labs to risk stratify.  Will follow.

## 2015-02-15 NOTE — Progress Notes (Signed)
Pre visit review using our clinic review tool, if applicable. No additional management support is needed unless otherwise documented below in the visit note. 

## 2015-02-15 NOTE — Assessment & Plan Note (Signed)
Pt due for repeat DEXA- order entered.  Check Vit D level, replete prn.

## 2015-02-15 NOTE — Assessment & Plan Note (Signed)
Pt's PE unchanged from previous.  Due for mammo, DEXA- orders entered.  Pt refused colonoscopy- but is willing to do cologuard.  Forms completed.  Pt to get Pneumovax and Flu shots today.  Check labs.  Anticipatory guidance provided.

## 2015-02-15 NOTE — Patient Instructions (Signed)
Follow up in 6 months to recheck BP We'll notify you of your lab results and make any changes if needed Continue to work on healthy diet and regular exercise- you look great! We'll call you with your mammogram and bone density appts Please complete the Cologuard as directed and return it by mail Call with any questions or concerns If you want to join us at the new [place], any scheduled appointments will automatically transfer and we will see you at [address], [place], [place] [id number] (OPENING 04/06/15) Happy Holidays!!!

## 2015-02-15 NOTE — Assessment & Plan Note (Signed)
Ongoing issue for pt.  Rarely requiring Alprazolam.  Pt needs refill b/c boyfriend is undergoing tx for prostate cancer and they are having a difficult time.  Will continue to follow.

## 2015-02-15 NOTE — Assessment & Plan Note (Signed)
Chronic problem.  Following w/ Dr Arbutus Leasat.  Will follow along and assist as able.

## 2015-02-15 NOTE — Assessment & Plan Note (Signed)
Chronic problem, well controlled.  Asymptomatic.  Check labs.  No anticipated med changes. °

## 2015-02-15 NOTE — Progress Notes (Signed)
   Subjective:    Patient ID: Carly [name], female    DOB: 1945/06/24, 69 y.o.   MRN: [id number]  HPI Here today for CPE.  Risk Factors: HTN- chronic problem, on HCTZ daily w/ good control. Overweight- chronic problem, BMI now 31.  Riding stationary bike for exercise- has lost 3 lbs. Osteopenia- chronic problem, due for repeat DEXA.   Parkinson's- chronic problem, following w/ Dr Tat Physical Activity: riding stationary bike Fall Risk: no recent falls, increased risk due to Parkinson's. Depression: denies current sxs Hearing: normal to conversational tones, decreased to whispered voice ADL's: independent Cognitive: normal linear thought process, memory and attention intact Home Safety: safe at home, lives w/ long term boyfriend Height, Weight, BMI, Visual Acuity: see vitals, vision corrected to 20/20 w/ glasses Counseling: due for mammo, DEXA (pt prefers downstairs), colon cancer screen- 'i don't want to do that.  But I will do the box in the mail', flu and Pneumovax. Care team reviewed and updated w/ pt Labs Ordered: See A&P Care Plan: See A&P    Review of Systems Patient reports no vision/ hearing changes, adenopathy,fever, weight change,  persistant/recurrent hoarseness , swallowing issues, chest pain, palpitations, edema, persistant/recurrent cough, hemoptysis, dyspnea (rest/exertional/paroxysmal nocturnal), gastrointestinal bleeding (melena, rectal bleeding), abdominal pain, significant heartburn, bowel changes, GU symptoms (dysuria, hematuria, incontinence), Gyn symptoms (abnormal  bleeding, pain),  syncope, focal weakness, memory loss, numbness & tingling, skin/hair/nail changes, abnormal bruising or bleeding, anxiety, or depression.     Objective:   Physical Exam General Appearance:    Alert, cooperative, no distress, appears stated age  Head:    Normocephalic, without obvious abnormality, atraumatic  Eyes:    PERRL, conjunctiva/corneas clear, EOM's intact, fundi   benign, both eyes  Ears:    Normal TM's and external ear canals, both ears  Nose:   Nares normal, septum midline, mucosa normal, no drainage    or sinus tenderness  Throat:   Lips, mucosa, and tongue normal; teeth and gums normal  Neck:   Supple, symmetrical, trachea midline, no adenopathy;    Thyroid: no enlargement/tenderness/nodules  Back:     Symmetric, no curvature, ROM normal, no CVA tenderness  Lungs:     Clear to auscultation bilaterally, respirations unlabored  Chest Wall:    No tenderness or deformity   Heart:    Regular rate and rhythm, S1 and S2 normal, no murmur, rub   or gallop  Breast Exam:    Deferred to mammo  Abdomen:     Soft, non-tender, bowel sounds active all four quadrants,    no masses, no organomegaly  Genitalia:    Deferred  Rectal:    Extremities:   Extremities normal, atraumatic, no cyanosis or edema  Pulses:   2+ and symmetric all extremities  Skin:   Skin color, texture, turgor normal, no rashes or lesions  Lymph nodes:   Cervical, supraclavicular, and axillary nodes normal  Neurologic:   CNII-XII intact, normal strength, sensation and reflexes    throughout          Assessment & Plan:

## 2015-03-02 ENCOUNTER — Ambulatory Visit (HOSPITAL_BASED_OUTPATIENT_CLINIC_OR_DEPARTMENT_OTHER)
Admission: RE | Admit: 2015-03-02 | Discharge: 2015-03-02 | Disposition: A | Discharge status: Home / Self Care | Payer: Medicare Other | Source: Ambulatory Visit | Attending: Family Medicine | Admitting: Family Medicine

## 2015-03-02 DIAGNOSIS — M8580 Other specified disorders of bone density and structure, unspecified site: Secondary | ICD-10-CM

## 2015-03-02 DIAGNOSIS — R928 Other abnormal and inconclusive findings on diagnostic imaging of breast: Secondary | ICD-10-CM | POA: Diagnosis not present

## 2015-03-02 DIAGNOSIS — Z780 Asymptomatic menopausal state: Secondary | ICD-10-CM | POA: Diagnosis not present

## 2015-03-02 DIAGNOSIS — Z1231 Encounter for screening mammogram for malignant neoplasm of breast: Secondary | ICD-10-CM | POA: Diagnosis not present

## 2015-03-12 ENCOUNTER — Other Ambulatory Visit: Payer: Self-pay | Admitting: Neurology

## 2015-03-12 NOTE — Telephone Encounter (Signed)
Carbidopa Levodopa 25/100 refill requested. Per last office note- patient to remain on medication. Refill approved and sent to patient's pharmacy.   

## 2015-03-16 ENCOUNTER — Ambulatory Visit (INDEPENDENT_AMBULATORY_CARE_PROVIDER_SITE_OTHER): Payer: Medicare Other | Admitting: Neurology

## 2015-03-16 ENCOUNTER — Encounter: Payer: Self-pay | Admitting: Neurology

## 2015-03-16 VITALS — BP 124/78 | HR 66 | Ht 65.0 in | Wt 185.0 lb

## 2015-03-16 DIAGNOSIS — E538 Deficiency of other specified B group vitamins: Secondary | ICD-10-CM

## 2015-03-16 DIAGNOSIS — G20B1 Parkinson's disease with dyskinesia, without mention of fluctuations: Secondary | ICD-10-CM

## 2015-03-16 DIAGNOSIS — G20 Parkinson's disease: Secondary | ICD-10-CM

## 2015-03-16 DIAGNOSIS — G249 Dystonia, unspecified: Secondary | ICD-10-CM

## 2015-03-16 DIAGNOSIS — G4752 REM sleep behavior disorder: Secondary | ICD-10-CM | POA: Diagnosis not present

## 2015-03-16 DIAGNOSIS — G20A1 Parkinson's disease without dyskinesia, without mention of fluctuations: Secondary | ICD-10-CM

## 2015-03-16 NOTE — Progress Notes (Signed)
Carly [name] was seen today in the movement disorders clinic for neurologic consultation at the request of Carly Asa, MD.  The consultation is for the evaluation of PD.  Pt is a former pt of Dr. Erling [name] and Dr. Janann [name].  I reviewed the prior records that were available to me.  The patient began to notice right hand tremor in 2009 with associated right arm stiffness.  She was initially started on selegiline and later pramipexole, which was worked up to 1 mg 3 times a day.  They did try Mirapex ER 3 mg for a short period of time, but her insurance quit paying for that.  Pt states that she loved it when she was on it once per day but it was just too expensive.  She went back to mirapex 1 mg tid.  She has trouble remembering the middle of the day mirapex but it reminds her with tremor and stiffness.   She was on Azilect, but it was also discontinued because of cost and it didn't help.  In July, 2014 carbidopa/levodopa 50/200 was added at night for cramping and RLS.  This helped, but the patient complained of some dizziness so this was decreased to carbidopa/levodopa 25/100 CR at night.  This was discontinued in May, 2015 as the patient's cramping seemed better and RLS was better.  Pt isn't sure that she even took it that long.  Pt is not currently exercising faithfully; in the past she was a marathon runner and was very competitive with sports.  She has never been to the PT program at the [facility] (lives between here and [place]).  06/08/14 update:  Pt has a hx of PD and last visit I increased her mirapex to 1.5 mg tid.  Going up on the dosage did help.   Pt has been under a lot of stress.  Her partner of 15 years found out he has prostate CA and some friends have died.  She has noted that stress increases tremor.  She moved a piece of heavy furniture after we last met and her L sciatica flared and she just got better and then she moved something again and injured her right groin.  This has  prevented exercise.  No lightheadness/near syncope.    09/08/14 update:  Pt returns for follow up.  She is on mirapex 1.5 mg tid.  She is c/o more freezing.  She states that she can get up and turn to the right but when trying to turn to the left, she has start hesitation/freezing.  No falls.  No lightheadness/near syncope.  She is walking and riding her bike some.    11/27/14 update:  Pt is on mirapex 1.5 tid and last visit I started her on carbidopa/levodopa 25/100 tid.  She is no longer having freezing spells and states that she is doing better than she expected.  She exercises some.  She is talking and dreaming more than she was; she is not falling out of bed.  States that her best friends husband just died and she thinks that caused more of the dreams.  03/16/15 update:  The patient is following up today regarding her Parkinson's disease.  She is on pramipexole, 1-1/2 mg 3 times a day in addition to carbidopa/levodopa 25/100, one tablet 3 times per day.  She is exercising and she is switching insurance and that will pay for silver sneakers so she will be able to go back to the gym.  She denies any  falls since our last visit.  No hallucinations.  Rare visual distortions.  No lightheadedness or near syncope.  She remains on oral B12 supplements for a history of B12 deficiency.   PREVIOUS MEDICATIONS: Sinemet CR, Mirapex and azilect and selegeline  ALLERGIES:   Allergies  Allergen Reactions  . Codeine     REACTION: nausea    CURRENT MEDICATIONS:  Outpatient Encounter Prescriptions as of 03/16/2015  Medication Sig  . ALPRAZolam (XANAX) 0.25 MG tablet Take 1 tablet (0.25 mg total) by mouth as needed for anxiety.  . carbidopa-levodopa (SINEMET IR) 25-100 MG tablet TAKE 1 TABLET BY MOUTH 3 (THREE) TIMES DAILY.  Marland Kitchen Cholecalciferol (VITAMIN D3) 5000 UNITS CAPS Take by mouth.  . Cyanocobalamin (VITAMIN B-12) 2000 MCG TBCR Take by mouth.  . hydrochlorothiazide (MICROZIDE) 12.5 MG capsule TAKE 1  CAPSULE BY MOUTH ONCE DAILY  . polyethylene glycol (MIRALAX / GLYCOLAX) packet Take 17 g by mouth daily as needed.  . pramipexole (MIRAPEX) 1 MG tablet TAKE 1.5 TABLETS (1.5 MG TOTAL) BY MOUTH 3 (THREE) TIMES DAILY.   No facility-administered encounter medications on file as of 03/16/2015.    PAST MEDICAL HISTORY:   Past Medical History  Diagnosis Date  . Parkinson's disease ([place])   . Anxiety   . Hypertension   . Vitamin D deficiency     PAST SURGICAL HISTORY:   Past Surgical History  Procedure Laterality Date  . Refractive surgery Bilateral     SOCIAL HISTORY:   Social History   Social History  . Marital Status: Married    Spouse Name: N/A  . Number of Children: 1  . Years of Education: N/A   Occupational History  . unempolyed     Social History Main Topics  . Smoking status: Never Smoker   . Smokeless tobacco: Never Used  . Alcohol Use: No  . Drug Use: No  . Sexual Activity: Not on file   Other Topics Concern  . Not on file   Social History Narrative   Patient lives at home with partner Carly [name].    Patient has one adult child.    Patient has 14 years of education.    Patient does not work.   Caffeine consumption is 1 cup daily     FAMILY HISTORY:   Family Status  Relation Status Death Age  . Father Deceased 61    heart attack, smoker  . Mother Deceased 85    heart   . Brother Deceased     brain tumor  . Brother Deceased     bladder cancer, smoker, ETOH  . Sister Alive     stroke  . Sister Deceased     healthy  . Son Alive     arthritis     ROS:  A complete 10 system review of systems was obtained and was unremarkable apart from what is mentioned above.  PHYSICAL EXAMINATION:    VITALS:   Filed Vitals:   03/16/15 1010  BP: 124/78  Pulse: 66  Height: $Remove'5\' 5"'YgMaOnY$  (1.651 m)  Weight: 185 lb (83.915 kg)    GEN:  The patient appears stated age and is in NAD. HEENT:  Normocephalic, atraumatic.  The mucous membranes are moist. The superficial  temporal arteries are without ropiness or tenderness. CV:  RRR w PVC's Lungs:  CTAB Neck/HEME:  There are no carotid bruits bilaterally.  Neurological examination:  Orientation: The patient is alert and oriented x3. Fund of knowledge is appropriate.  Recent and remote memory  are intact.  Attention and concentration are normal.    Able to name objects and repeat phrases. Cranial nerves: There is good facial symmetry. There is mild facial hypomimia.  There are no square wave jerks.  The visual fields are full to confrontational testing. The speech is fluent and clear. Soft palate rises symmetrically and there is no tongue deviation. Hearing is intact to conversational tone. Sensation: Sensation is intact to light touch throughout. Motor: Strength is 5/5 in the bilateral upper and lower extremities.   Shoulder shrug is equal and symmetric.  There is no pronator drift.   Movement examination: Tone: There is normal tone in the bilateral UE Abnormal movements: There is no resting tremor today at all; there is minor dyskinesia, more in the LLE Coordination:  There is good RAM's today Gait and Station: The patient has no significant difficulty arising out of a deep-seated chair without the use of the hands. She has normal arm swing bilaterally today.  The patient's stride length is normal.  Negative pull test.  Labs  Lab Results  Component Value Date   VITAMINB12 831 02/09/2014     ASSESSMENT/PLAN:  1.  Idiopathic Parkinson's disease, diagnosed in 2009.  -She will remain on Mirapex, 1.5 mg 3 times per day.  -continue on the carbidopa/levodopa 25/100 tid.  Has minor dyskinesia today but she doesn't notice it.    -We again talked about the importance of safe, cardiovascular exercise.   2.  REM behavior disorder.  -Overall mild.  Just consists of occasional yelling out at night.  She is to be on clonazepam but is no longer.  We'll just keep an eye on this.  Doesn't want to add medication 3.   History of B12 deficiency and peripheral neuropathy, per Dr. love/[facility] records.  -Did not see significant evidence of peripheral neuropathy on examination today, but this certainly could contribute to balance issues.  She really has no significant balance problems yet, however.  She is off of injections now but on B12 orally 4.  Much greater than 50% of this visit was spent in counseling with the patient.  Total face to face time:  25 min

## 2015-03-23 ENCOUNTER — Other Ambulatory Visit: Payer: Self-pay | Admitting: Family Medicine

## 2015-03-23 DIAGNOSIS — R928 Other abnormal and inconclusive findings on diagnostic imaging of breast: Secondary | ICD-10-CM

## 2015-03-31 ENCOUNTER — Ambulatory Visit
Admission: RE | Admit: 2015-03-31 | Discharge: 2015-03-31 | Disposition: A | Discharge status: Home / Self Care | Payer: Medicare Other | Source: Ambulatory Visit | Attending: Family Medicine | Admitting: Family Medicine

## 2015-03-31 DIAGNOSIS — R928 Other abnormal and inconclusive findings on diagnostic imaging of breast: Secondary | ICD-10-CM

## 2015-04-01 ENCOUNTER — Other Ambulatory Visit: Payer: Self-pay | Admitting: Family Medicine

## 2015-04-01 NOTE — Telephone Encounter (Signed)
Medication filled to pharmacy as requested.   

## 2015-05-14 ENCOUNTER — Encounter: Payer: Self-pay | Admitting: Physician Assistant

## 2015-05-14 ENCOUNTER — Ambulatory Visit (INDEPENDENT_AMBULATORY_CARE_PROVIDER_SITE_OTHER): Payer: Medicare HMO | Admitting: Physician Assistant

## 2015-05-14 VITALS — BP 153/65 | HR 63 | Temp 97.5°F | Ht 65.0 in | Wt 183.2 lb

## 2015-05-14 DIAGNOSIS — R599 Enlarged lymph nodes, unspecified: Secondary | ICD-10-CM | POA: Diagnosis not present

## 2015-05-14 DIAGNOSIS — R590 Localized enlarged lymph nodes: Secondary | ICD-10-CM

## 2015-05-14 HISTORY — DX: Localized enlarged lymph nodes: R59.0

## 2015-05-14 NOTE — Progress Notes (Signed)
Patient presents to clinic today c/o swollen and tender lymph node on the R side of neck x 1 day. Endorses mild nasal congestion and PND. Denies fever, chills, sore throat, sinus pressure or other URI symptoms. Denies recent travel or sick contact.  Past Medical History  Diagnosis Date  . Parkinson's disease ([facility])   . Anxiety   . Hypertension   . Vitamin D deficiency     Current Outpatient Prescriptions on File Prior to Visit  Medication Sig Dispense Refill  . ALPRAZolam (XANAX) 0.25 MG tablet Take 1 tablet (0.25 mg total) by mouth as needed for anxiety. 30 tablet 3  . carbidopa-levodopa (SINEMET IR) 25-100 MG tablet TAKE 1 TABLET BY MOUTH 3 (THREE) TIMES DAILY. 270 tablet 0  . Cholecalciferol (VITAMIN D3) 5000 UNITS CAPS Take by mouth.    . Cyanocobalamin (VITAMIN B-12) 2000 MCG TBCR Take by mouth.    . hydrochlorothiazide (MICROZIDE) 12.5 MG capsule TAKE 1 CAPSULE BY MOUTH ONCE DAILY 90 capsule 1  . polyethylene glycol (MIRALAX / GLYCOLAX) packet Take 17 g by mouth daily as needed.    . pramipexole (MIRAPEX) 1 MG tablet TAKE 1.5 TABLETS (1.5 MG TOTAL) BY MOUTH 3 (THREE) TIMES DAILY. 135 tablet 5   No current facility-administered medications on file prior to visit.    Allergies  Allergen Reactions  . Codeine     REACTION: nausea    Family History  Problem Relation Age of Onset  . Heart attack Father   . Heart failure Mother     Social History   Social History  . Marital Status: Married    Spouse Name: N/A  . Number of Children: 1  . Years of Education: N/A   Occupational History  . unempolyed     Social History Main Topics  . Smoking status: Never Smoker   . Smokeless tobacco: Never Used  . Alcohol Use: No  . Drug Use: No  . Sexual Activity: Not Asked   Other Topics Concern  . None   Social History Narrative   Patient lives at home with partner Demetrios Loll.    Patient has one adult child.    Patient has 14 years of education.    Patient does not work.    Caffeine consumption is 1 cup daily     Review of Systems - See HPI.  All other ROS are negative.  BP 153/65 mmHg  Pulse 63  Temp(Src) 97.5 F (36.4 C) (Oral)  Ht  (1.651 m)  Wt 183 lb 3.2 oz (83.099 kg)  BMI 30.49 kg/m2  SpO2 98%  Physical Exam  Constitutional: She is oriented to person, place, and time and well-developed, well-nourished, and in no distress.  HENT:  Head: Normocephalic and atraumatic.  Right Ear: Tympanic membrane normal.  Left Ear: Tympanic membrane normal.  Nose: Rhinorrhea present. Right sinus exhibits no maxillary sinus tenderness and no frontal sinus tenderness. Left sinus exhibits no maxillary sinus tenderness and no frontal sinus tenderness.  Mouth/Throat: Uvula is midline, oropharynx is clear and moist and mucous membranes are normal.  Eyes: Conjunctivae are normal.  Neck: Neck supple.  Cardiovascular: Normal rate, regular rhythm, normal heart sounds and intact distal pulses.   Pulmonary/Chest: Effort normal and breath sounds normal. No respiratory distress. She has no wheezes. She has no rales. She exhibits no tenderness.  Lymphadenopathy:       Head (right side): Submandibular adenopathy present. No submental, no tonsillar, no preauricular, no posterior auricular and no occipital  adenopathy present.       Head (left side): No submental, no submandibular, no tonsillar, no preauricular, no posterior auricular and no occipital adenopathy present.    She has no cervical adenopathy.  Neurological: She is alert and oriented to person, place, and time.  Skin: Skin is warm and dry. No rash noted.  Psychiatric: Affect normal.    Recent Results (from the past 2160 hour(s))  Lipid panel     Status: Abnormal   Collection Time: 02/15/15  9:50 AM  Result Value Ref Range   Cholesterol 172 0 - 200 mg/dL    Comment: ATP III Classification       Desirable:  < 200 mg/dL               Borderline High:  200 - 239 mg/dL          High:  > = 098 mg/dL    Triglycerides 11.9 0.0 - 149.0 mg/dL    Comment: Normal:  <147 mg/dLBorderline High:  150 - 199 mg/dL   HDL 82.95 >62.13 mg/dL   VLDL 08.6 0.0 - 57.8 mg/dL   LDL Cholesterol 469 (H) 0 - 99 mg/dL   Total CHOL/HDL Ratio 3     Comment:                Men          Women1/2 Average Risk     3.4          3.3Average Risk          5.0          4.42X Average Risk          9.6          7.13X Average Risk          15.0          11.0                       NonHDL 112.08     Comment: NOTE:  Non-HDL goal should be 30 mg/dL higher than patient's LDL goal (i.e. LDL goal of < 70 mg/dL, would have non-HDL goal of < 100 mg/dL)  Basic metabolic panel     Status: Abnormal   Collection Time: 02/15/15  9:50 AM  Result Value Ref Range   Sodium 140 135 - 145 mEq/L   Potassium 4.5 3.5 - 5.1 mEq/L   Chloride 100 96 - 112 mEq/L   CO2 30 19 - 32 mEq/L   Glucose, Bld 101 (H) 70 - 99 mg/dL   BUN 19 6 - 23 mg/dL   Creatinine, Ser 6.29 (H) 0.40 - 1.20 mg/dL   Calcium 52.8 8.4 - 41.3 mg/dL   GFR 24.40 (L) >10.27 mL/min  TSH     Status: None   Collection Time: 02/15/15  9:50 AM  Result Value Ref Range   TSH 1.46 0.35 - 4.50 uIU/mL  Hepatic function panel     Status: None   Collection Time: 02/15/15  9:50 AM  Result Value Ref Range   Total Bilirubin 0.8 0.2 - 1.2 mg/dL   Bilirubin, Direct 0.2 0.0 - 0.3 mg/dL   Alkaline Phosphatase 82 39 - 117 U/L   AST 17 0 - 37 U/L   ALT 4 0 - 35 U/L   Total Protein 7.5 6.0 - 8.3 g/dL   Albumin 4.2 3.5 - 5.2 g/dL  CBC with Differential/Platelet     Status: None  Collection Time: 02/15/15  9:50 AM  Result Value Ref Range   WBC 7.1 4.0 - 10.5 K/uL   RBC 4.30 3.87 - 5.11 Mil/uL   Hemoglobin 13.1 12.0 - 15.0 g/dL   HCT 40.9 81.1 - 91.4 %   MCV 90.9 78.0 - 100.0 fl   MCHC 33.4 30.0 - 36.0 g/dL   RDW 78.2 95.6 - 21.3 %   Platelets 170.0 150.0 - 400.0 K/uL   Neutrophils Relative % 71.5 43.0 - 77.0 %   Lymphocytes Relative 21.9 12.0 - 46.0 %   Monocytes Relative 5.4 3.0 - 12.0  %   Eosinophils Relative 0.6 0.0 - 5.0 %   Basophils Relative 0.6 0.0 - 3.0 %   Neutro Abs 5.1 1.4 - 7.7 K/uL   Lymphs Abs 1.5 0.7 - 4.0 K/uL   Monocytes Absolute 0.4 0.1 - 1.0 K/uL   Eosinophils Absolute 0.0 0.0 - 0.7 K/uL   Basophils Absolute 0.0 0.0 - 0.1 K/uL  VITAMIN D 25 Hydroxy (Vit-D Deficiency, Fractures)     Status: None   Collection Time: 02/15/15  9:50 AM  Result Value Ref Range   VITD 73.80 30.00 - 100.00 ng/mL    Assessment/Plan: Anterior cervical lymphadenopathy Solitary R sided nodule about 2 cm in diameter -- mobile, soft and tender. Associated with 2 days of nasal congestion and post-nasal drip. No constitutional or other URI symptoms. Rapid strep negative. Will check CBC today. Supportive measures reviewed. Will monitor and follow-up next Tuesday to reassess. Likely reactive node to viral illness that will run its course.

## 2015-05-14 NOTE — Assessment & Plan Note (Signed)
Solitary R sided nodule about 2 cm in diameter -- mobile, soft and tender. Associated with 2 days of nasal congestion and post-nasal drip. No constitutional or other URI symptoms. Rapid strep negative. Will check CBC today. Supportive measures reviewed. Will monitor and follow-up next Tuesday to reassess. Likely reactive node to viral illness that will run its course.

## 2015-05-14 NOTE — Patient Instructions (Signed)
Your exam looks good today. The lymph node is most likely reactive to a viral infection. I am checking your blood count today to make sure it is normal -- I expect it to be. Stay hydrated and rest. You may start to have cold symptoms. Take OTC medications for symptom relief.   Follow-up with me next Tuesday so I can take another look!

## 2015-05-14 NOTE — Progress Notes (Signed)
Pre visit review using our clinic review tool, if applicable. No additional management support is needed unless otherwise documented below in the visit note. 

## 2015-05-15 LAB — CBC
HCT: 37.4 % (ref 36.0–46.0)
Hemoglobin: 12.1 g/dL (ref 12.0–15.0)
MCHC: 32.4 g/dL (ref 30.0–36.0)
MCV: 93.3 fl (ref 78.0–100.0)
PLATELETS: 184 10*3/uL (ref 150.0–400.0)
RBC: 4.01 Mil/uL (ref 3.87–5.11)
RDW: 12.4 % (ref 11.5–15.5)
WBC: 8.1 10*3/uL (ref 4.0–10.5)

## 2015-05-18 ENCOUNTER — Telehealth: Payer: Self-pay | Admitting: Family Medicine

## 2015-05-18 ENCOUNTER — Ambulatory Visit: Payer: Medicare HMO | Admitting: Physician Assistant

## 2015-05-18 NOTE — Telephone Encounter (Signed)
No charge. 

## 2015-05-18 NOTE — Telephone Encounter (Signed)
Patient LVM 05/17/15 at 6:37pm cancelling her 8:30am follow up appointment for today. Charge or no charge

## 2015-06-15 ENCOUNTER — Encounter: Payer: Self-pay | Admitting: Neurology

## 2015-06-15 ENCOUNTER — Ambulatory Visit (INDEPENDENT_AMBULATORY_CARE_PROVIDER_SITE_OTHER): Payer: Medicare HMO | Admitting: Neurology

## 2015-06-15 VITALS — BP 120/64 | HR 67 | Ht 65.0 in | Wt 183.0 lb

## 2015-06-15 DIAGNOSIS — G20 Parkinson's disease: Secondary | ICD-10-CM | POA: Diagnosis not present

## 2015-06-15 DIAGNOSIS — G4752 REM sleep behavior disorder: Secondary | ICD-10-CM

## 2015-06-15 DIAGNOSIS — E538 Deficiency of other specified B group vitamins: Secondary | ICD-10-CM | POA: Diagnosis not present

## 2015-06-15 DIAGNOSIS — G20A1 Parkinson's disease without dyskinesia, without mention of fluctuations: Secondary | ICD-10-CM

## 2015-06-15 MED ORDER — CARBIDOPA-LEVODOPA 25-100 MG PO TABS: 1.0000 | ORAL_TABLET | Freq: Three times a day (TID) | ORAL | Status: DC

## 2015-06-15 NOTE — Progress Notes (Signed)
Carly [name] was seen today in the movement disorders clinic for neurologic consultation at the request of [name], MD.  The consultation is for the evaluation of PD.  Pt is a former pt of [name] and [name].  I reviewed the prior records that were available to me.  The patient began to notice right hand tremor in 2009 with associated right arm stiffness.  She was initially started on selegiline and later pramipexole, which was worked up to 1 mg 3 times a day.  They did try Mirapex ER 3 mg for a short period of time, but her insurance quit paying for that.  Pt states that she loved it when she was on it once per day but it was just too expensive.  She went back to mirapex 1 mg tid.  She has trouble remembering the middle of the day mirapex but it reminds her with tremor and stiffness.   She was on Azilect, but it was also discontinued because of cost and it didn't help.  In July, 2014 carbidopa/levodopa 50/200 was added at night for cramping and RLS.  This helped, but the patient complained of some dizziness so this was decreased to carbidopa/levodopa 25/100 CR at night.  This was discontinued in May, 2015 as the patient's cramping seemed better and RLS was better.  Pt isn't sure that she even took it that long.  Pt is not currently exercising faithfully; in the past she was a marathon runner and was very competitive with sports.  She has never been to the PT program at the [facility] (lives between here and [place]).  06/08/14 update:  Pt has a hx of PD and last visit I increased her mirapex to 1.5 mg tid.  Going up on the dosage did help.   Pt has been under a lot of stress.  Her partner of 15 years found out he has prostate CA and some friends have died.  She has noted that stress increases tremor.  She moved a piece of heavy furniture after we last met and her L sciatica flared and she just got better and then she moved something again and injured her right groin.  This has  prevented exercise.  No lightheadness/near syncope.    09/08/14 update:  Pt returns for follow up.  She is on mirapex 1.5 mg tid.  She is c/o more freezing.  She states that she can get up and turn to the right but when trying to turn to the left, she has start hesitation/freezing.  No falls.  No lightheadness/near syncope.  She is walking and riding her bike some.    11/27/14 update:  Pt is on mirapex 1.5 tid and last visit I started her on carbidopa/levodopa 25/100 tid.  She is no longer having freezing spells and states that she is doing better than she expected.  She exercises some.  She is talking and dreaming more than she was; she is not falling out of bed.  States that her best friends husband just died and she thinks that caused more of the dreams.  03/16/15 update:  The patient is following up today regarding her Parkinson's disease.  She is on pramipexole, 1-1/2 mg 3 times a day in addition to carbidopa/levodopa 25/100, one tablet 3 times per day.  She is exercising and she is switching insurance and that will pay for silver sneakers so she will be able to go back to the gym.  She denies any  falls since our last visit.  No hallucinations.  Rare visual distortions.  No lightheadedness or near syncope.  She remains on oral B12 supplements for a history of B12 deficiency.  06/15/15 update:  The patient is following up today regarding her Parkinson's disease.  She is on pramipexole, 1.5 mg 3 times a day in addition to carbidopa/levodopa 25/100, one tablet 3 times per day.  States that if she is late with her carbidopa/levodopa 25/100, she can hardly move.  No falls since last visit.  No hallucinations.  No lightheadedness or syncope.  Sleeping well.  States that her significant other had prostate CA and then had MI and then had CABG so she has been exercising a little less.  She is riding her bike.  In regards to RBD, she states that she was doing good until this week and she woke up sitting up yelling  and trying to shoot something.   PREVIOUS MEDICATIONS: Sinemet CR, Mirapex and azilect and selegeline  ALLERGIES:   Allergies  Allergen Reactions  . Codeine     REACTION: nausea    CURRENT MEDICATIONS:  Outpatient Encounter Prescriptions as of 06/15/2015  Medication Sig  . ALPRAZolam (XANAX) 0.25 MG tablet Take 1 tablet (0.25 mg total) by mouth as needed for anxiety.  . carbidopa-levodopa (SINEMET IR) 25-100 MG tablet TAKE 1 TABLET BY MOUTH 3 (THREE) TIMES DAILY.  Marland Kitchen Cholecalciferol (VITAMIN D3) 5000 UNITS CAPS Take by mouth.  . Cyanocobalamin (VITAMIN B-12) 2000 MCG TBCR Take by mouth.  . hydrochlorothiazide (MICROZIDE) 12.5 MG capsule TAKE 1 CAPSULE BY MOUTH ONCE DAILY  . pramipexole (MIRAPEX) 1 MG tablet TAKE 1.5 TABLETS (1.5 MG TOTAL) BY MOUTH 3 (THREE) TIMES DAILY.  . [DISCONTINUED] polyethylene glycol (MIRALAX / GLYCOLAX) packet Take 17 g by mouth daily as needed.   No facility-administered encounter medications on file as of 06/15/2015.    PAST MEDICAL HISTORY:   Past Medical History  Diagnosis Date  . Parkinson's disease ([place])   . Anxiety   . Hypertension   . Vitamin D deficiency     PAST SURGICAL HISTORY:   Past Surgical History  Procedure Laterality Date  . Refractive surgery Bilateral     SOCIAL HISTORY:   Social History   Social History  . Marital Status: Married    Spouse Name: N/A  . Number of Children: 1  . Years of Education: N/A   Occupational History  . unempolyed     Social History Main Topics  . Smoking status: Never Smoker   . Smokeless tobacco: Never Used  . Alcohol Use: No  . Drug Use: No  . Sexual Activity: Not on file   Other Topics Concern  . Not on file   Social History Narrative   Patient lives at home with partner [name].    Patient has one adult child.    Patient has 14 years of education.    Patient does not work.   Caffeine consumption is 1 cup daily     FAMILY HISTORY:   Family Status  Relation Status Death Age    . Father Deceased 17    heart attack, smoker  . Mother Deceased 74    heart   . Brother Deceased     brain tumor  . Brother Deceased     bladder cancer, smoker, ETOH  . Sister Alive     stroke  . Sister Deceased     healthy  . Son Alive     arthritis  ROS:  A complete 10 system review of systems was obtained and was unremarkable apart from what is mentioned above.  PHYSICAL EXAMINATION:    VITALS:   Filed Vitals:   06/15/15 0941  BP: 120/64  Pulse: 67  Height: '5\' 5"'$  (1.651 m)  Weight: 183 lb (83.008 kg)    GEN:  The patient appears stated age and is in NAD. HEENT:  Normocephalic, atraumatic.  The mucous membranes are moist. The superficial temporal arteries are without ropiness or tenderness. CV:  RRR w PVC's Lungs:  CTAB Neck/HEME:  There are no carotid bruits bilaterally.  Neurological examination:  Orientation: The patient is alert and oriented x3. Fund of knowledge is appropriate.  Cranial nerves: There is good facial symmetry. There is mild facial hypomimia.  There are no square wave jerks.  The visual fields are full to confrontational testing. The speech is fluent and clear. Soft palate rises symmetrically and there is no tongue deviation. Hearing is intact to conversational tone. Sensation: Sensation is intact to light touch throughout. Motor: Strength is 5/5 in the bilateral upper and lower extremities.   Shoulder shrug is equal and symmetric.  There is no pronator drift.   Movement examination: Tone: There is normal tone in the bilateral UE Abnormal movements: There is no resting tremor today at all; there is dyskinesia, in the legs.  None in the arms.   Coordination:  There is good RAM's today Gait and Station: The patient has no significant difficulty arising out of a deep-seated chair without the use of the hands. She has normal arm swing bilaterally today.  The patient's stride length is normal.  Negative pull test.  Labs  Lab Results  Component  Value Date   VITAMINB12 831 02/09/2014     ASSESSMENT/PLAN:  1.  Idiopathic Parkinson's disease, diagnosed in 2009.  -She will remain on Mirapex, 1.5 mg 3 times per day.  -continue on the carbidopa/levodopa 25/100 tid.  Has dyskinesia today but she doesn't notice it.    This was RF today  -We again talked about the importance of safe, cardiovascular exercise.   2.  REM behavior disorder.  -Overall mild.  Just consists of occasional yelling out at night.  She is to be on clonazepam but is no longer.  We'll just keep an eye on this.  Doesn't want to add medication 3.  History of B12 deficiency and peripheral neuropathy, per Dr. love/[facility] records.  -Did not see significant evidence of peripheral neuropathy on examination today, but this certainly could contribute to balance issues.  She really has no significant balance problems yet, however.  She is off of injections now but on B12 orally 4.  Much greater than 50% of this visit was spent in counseling with the patient.  Total face to face time:  25 min

## 2015-08-14 ENCOUNTER — Other Ambulatory Visit: Payer: Self-pay | Admitting: Neurology

## 2015-08-16 ENCOUNTER — Ambulatory Visit (INDEPENDENT_AMBULATORY_CARE_PROVIDER_SITE_OTHER): Payer: Medicare HMO | Admitting: Family Medicine

## 2015-08-16 ENCOUNTER — Encounter: Payer: Self-pay | Admitting: Family Medicine

## 2015-08-16 VITALS — BP 122/68 | HR 63 | Temp 98.0°F | Resp 16 | Ht 65.0 in | Wt 180.2 lb

## 2015-08-16 DIAGNOSIS — I10 Essential (primary) hypertension: Secondary | ICD-10-CM

## 2015-08-16 DIAGNOSIS — R69 Illness, unspecified: Secondary | ICD-10-CM | POA: Diagnosis not present

## 2015-08-16 DIAGNOSIS — F411 Generalized anxiety disorder: Secondary | ICD-10-CM | POA: Diagnosis not present

## 2015-08-16 LAB — BASIC METABOLIC PANEL
BUN: 21 mg/dL (ref 6–23)
CALCIUM: 9.6 mg/dL (ref 8.4–10.5)
CO2: 28 meq/L (ref 19–32)
Chloride: 101 mEq/L (ref 96–112)
Creatinine, Ser: 1.13 mg/dL (ref 0.40–1.20)
GFR: 50.92 mL/min — AB (ref >60.00)
Glucose, Bld: 96 mg/dL (ref 70–99)
Potassium: 3.7 mEq/L (ref 3.5–5.1)
SODIUM: 136 meq/L (ref 135–145)

## 2015-08-16 LAB — CBC WITH DIFFERENTIAL/PLATELET
BASOS ABS: 0 10*3/uL (ref 0.0–0.1)
Basophils Relative: 0.3 % (ref 0.0–3.0)
Eosinophils Absolute: 0 10*3/uL (ref 0.0–0.7)
Eosinophils Relative: 0.6 % (ref 0.0–5.0)
HEMATOCRIT: 39.5 % (ref 36.0–46.0)
Hemoglobin: 13.2 g/dL (ref 12.0–15.0)
LYMPHS PCT: 20.7 % (ref 12.0–46.0)
Lymphs Abs: 1.5 10*3/uL (ref 0.7–4.0)
MCHC: 33.4 g/dL (ref 30.0–36.0)
MCV: 89.8 fl (ref 78.0–100.0)
MONOS PCT: 4.4 % (ref 3.0–12.0)
Monocytes Absolute: 0.3 10*3/uL (ref 0.1–1.0)
NEUTROS ABS: 5.5 10*3/uL (ref 1.4–7.7)
Neutrophils Relative %: 74 % (ref 43.0–77.0)
Platelets: 174 10*3/uL (ref 150.0–400.0)
RBC: 4.4 Mil/uL (ref 3.87–5.11)
RDW: 12.9 % (ref 11.5–15.5)
WBC: 7.4 10*3/uL (ref 4.0–10.5)

## 2015-08-16 MED ORDER — ALPRAZOLAM 0.25 MG PO TABS: 0.2500 mg | ORAL_TABLET | ORAL | Status: DC | PRN

## 2015-08-16 NOTE — Assessment & Plan Note (Signed)
Ongoing issue.  Doing well but asking for alprazolam refill while settling mom's estate.  Refill provided.

## 2015-08-16 NOTE — Telephone Encounter (Signed)
Mirapex refill requested. Per last office note- patient to remain on medication. Refill approved and sent to patient's pharmacy.   

## 2015-08-16 NOTE — Progress Notes (Signed)
   Subjective:    Patient ID: Laren EvertsBeverly B Consalvo, female    DOB: 05-24-1945, 70 y.o.   MRN: [id number]  HPI HTN- chronic problem, on HCTZ daily w/ good control.  Has lost 3 lbs since last visit.  Exercising regularly- walking regularly.  No CP, SOB, HAs, visual changes, edema.  No dizziness.    Anxiety- pt asking for refill on Xanax as she finishes her mother's estate.   Review of Systems For ROS see HPI     Objective:   Physical Exam  Constitutional: She is oriented to person, place, and time. She appears well-developed and well-nourished. No distress.  HENT:  Head: Normocephalic and atraumatic.  Eyes: Conjunctivae and EOM are normal. Pupils are equal, round, and reactive to light.  Neck: Normal range of motion. Neck supple. No thyromegaly present.  Cardiovascular: Normal rate, regular rhythm, normal heart sounds and intact distal pulses.   No murmur heard. Pulmonary/Chest: Effort normal and breath sounds normal. No respiratory distress.  Abdominal: Soft. She exhibits no distension. There is no tenderness.  Musculoskeletal: She exhibits no edema.  Lymphadenopathy:    She has no cervical adenopathy.  Neurological: She is alert and oriented to person, place, and time.  Skin: Skin is warm and dry.  Psychiatric: She has a normal mood and affect. Her behavior is normal.  Vitals reviewed.         Assessment & Plan:

## 2015-08-16 NOTE — Assessment & Plan Note (Signed)
Chronic problem, well controlled.  Asymptomatic.  Check labs.  No anticipated med changes.  Will follow. 

## 2015-08-16 NOTE — Patient Instructions (Signed)
Schedule your complete physical in 6 months We'll notify you of your lab results and make any changes if needed Keep up the good work on healthy diet and regular exercise- you look great! Call with any questions or concerns Thanks for sticking with us!!! Have a great summer!!! 

## 2015-08-16 NOTE — Progress Notes (Signed)
Pre visit review using our clinic review tool, if applicable. No additional management support is needed unless otherwise documented below in the visit note. 

## 2015-10-14 ENCOUNTER — Other Ambulatory Visit: Payer: Self-pay | Admitting: Family Medicine

## 2015-10-14 NOTE — Telephone Encounter (Signed)
Medication filled to pharmacy as requested.   

## 2015-11-11 ENCOUNTER — Other Ambulatory Visit: Payer: Self-pay | Admitting: Neurology

## 2015-11-12 NOTE — Progress Notes (Signed)
Carly [name] was seen today in the movement disorders clinic for neurologic consultation at the request of [name], MD.  The consultation is for the evaluation of PD.  Pt is a former pt of [name] and [name].  I reviewed the prior records that were available to me.  The patient began to notice right hand tremor in 2009 with associated right arm stiffness.  She was initially started on selegiline and later pramipexole, which was worked up to 1 mg 3 times a day.  They did try Mirapex ER 3 mg for a short period of time, but her insurance quit paying for that.  Pt states that she loved it when she was on it once per day but it was just too expensive.  She went back to mirapex 1 mg tid.  She has trouble remembering the middle of the day mirapex but it reminds her with tremor and stiffness.   She was on Azilect, but it was also discontinued because of cost and it didn't help.  In July, 2014 carbidopa/levodopa 50/200 was added at night for cramping and RLS.  This helped, but the patient complained of some dizziness so this was decreased to carbidopa/levodopa 25/100 CR at night.  This was discontinued in May, 2015 as the patient's cramping seemed better and RLS was better.  Pt isn't sure that she even took it that long.  Pt is not currently exercising faithfully; in the past she was a marathon runner and was very competitive with sports.  She has never been to the PT program at the [facility] center (lives between here and [place]).  06/08/14 update:  Pt has a hx of PD and last visit I increased her mirapex to 1.5 mg tid.  Going up on the dosage did help.   Pt has been under a lot of stress.  Her partner of 15 years found out he has prostate CA and some friends have died.  She has noted that stress increases tremor.  She moved a piece of heavy furniture after we last met and her L sciatica flared and she just got better and then she moved something again and injured her right groin.  This has  prevented exercise.  No lightheadness/near syncope.    09/08/14 update:  Pt returns for follow up.  She is on mirapex 1.5 mg tid.  She is c/o more freezing.  She states that she can get up and turn to the right but when trying to turn to the left, she has start hesitation/freezing.  No falls.  No lightheadness/near syncope.  She is walking and riding her bike some.    11/27/14 update:  Pt is on mirapex 1.5 tid and last visit I started her on carbidopa/levodopa 25/100 tid.  She is no longer having freezing spells and states that she is doing better than she expected.  She exercises some.  She is talking and dreaming more than she was; she is not falling out of bed.  States that her best friends husband just died and she thinks that caused more of the dreams.  03/16/15 update:  The patient is following up today regarding her Parkinson's disease.  She is on pramipexole, 1-1/2 mg 3 times a day in addition to carbidopa/levodopa 25/100, one tablet 3 times per day.  She is exercising and she is switching insurance and that will pay for silver sneakers so she will be able to go back to the gym.  She denies any  falls since our last visit.  No hallucinations.  Rare visual distortions.  No lightheadedness or near syncope.  She remains on oral B12 supplements for a history of B12 deficiency.  06/15/15 update:  The patient is following up today regarding her Parkinson's disease.  She is on pramipexole, 1.5 mg 3 times a day in addition to carbidopa/levodopa 25/100, one tablet 3 times per day.  States that if she is late with her carbidopa/levodopa 25/100, she can hardly move.  No falls since last visit.  No hallucinations.  No lightheadedness or syncope.  Sleeping well.  States that her significant other had prostate CA and then had MI and then had CABG so she has been exercising a little less.  She is riding her bike.  In regards to RBD, she states that she was doing good until this week and she woke up sitting up yelling  and trying to shoot something.  11/16/15 update:  The patient followed up today regarding her Parkinson's disease.  She remains on pramipexole, 1.5 mg 3 times per day and carbidopa/levodopa 25 mg 3 times per day.  States that 2nd dose seems to wear off about an hour before the 3rd dose.  Overall, doing and feeling well.  "I'm feeling good."  She saw Dr. Beverely Low in May, and I reviewed those records.  The patient denies any falls since last visit.  She continues to exercise.  She is riding her stationary bike.  She denies any falls since last visit.  No lightheadedness.  No syncope.  No hallucinations.   PREVIOUS MEDICATIONS: Sinemet CR, Mirapex and azilect and selegeline  ALLERGIES:   Allergies  Allergen Reactions  . Codeine     REACTION: nausea    CURRENT MEDICATIONS:  Outpatient Encounter Prescriptions as of 11/16/2015  Medication Sig  . ALPRAZolam (XANAX) 0.25 MG tablet Take 1 tablet (0.25 mg total) by mouth as needed for anxiety.  . carbidopa-levodopa (SINEMET IR) 25-100 MG tablet Take 1 tablet by mouth 3 (three) times daily.  . Cholecalciferol (VITAMIN D3) 5000 UNITS CAPS Take by mouth.  . Cyanocobalamin (VITAMIN B-12) 2000 MCG TBCR Take by mouth.  . hydrochlorothiazide (MICROZIDE) 12.5 MG capsule TAKE ONE CAPSULE BY MOUTH DAILY  . pramipexole (MIRAPEX) 1 MG tablet TAKE 1 AND 1/2 TABLET THREE TIMES A DAY  . [DISCONTINUED] pramipexole (MIRAPEX) 1 MG tablet TAKE 1 AND 1/2 TABLET THREE TIMES A DAY   No facility-administered encounter medications on file as of 11/16/2015.     PAST MEDICAL HISTORY:   Past Medical History:  Diagnosis Date  . Anxiety   . Hypertension   . Parkinson's disease ([facility])   . Vitamin D deficiency     PAST SURGICAL HISTORY:   Past Surgical History:  Procedure Laterality Date  . REFRACTIVE SURGERY Bilateral     SOCIAL HISTORY:   Social History   Social History  . Marital status: Married    Spouse name: N/A  . Number of children: 1  . Years of  education: N/A   Occupational History  . unempolyed     Social History Main Topics  . Smoking status: Never Smoker  . Smokeless tobacco: Never Used  . Alcohol use No  . Drug use: No  . Sexual activity: Not on file   Other Topics Concern  . Not on file   Social History Narrative   Patient lives at home with partner Demetrios Loll.    Patient has one adult child.    Patient has 14  years of education.    Patient does not work.   Caffeine consumption is 1 cup daily     FAMILY HISTORY:   Family Status  Relation Status  . Father Deceased at age 23   heart attack, smoker  . Mother Deceased at age 47   heart   . Brother Deceased   brain tumor  . Brother Deceased   bladder cancer, smoker, ETOH  . Sister Alive   stroke  . Sister Deceased   healthy  . Son Alive   arthritis     ROS:  A complete 10 system review of systems was obtained and was unremarkable apart from what is mentioned above.  PHYSICAL EXAMINATION:    VITALS:   Vitals:   11/16/15 0947  BP: 106/70  Pulse: (!) 56  Weight: 177 lb (80.3 kg)  Height: 5\' 5"  (1.651 m)    GEN:  The patient appears stated age and is in NAD. HEENT:  Normocephalic, atraumatic.  The mucous membranes are moist. The superficial temporal arteries are without ropiness or tenderness. CV:  RRR w PVC's Lungs:  CTAB Neck/HEME:  There are no carotid bruits bilaterally.  Neurological examination:  Orientation: The patient is alert and oriented x3. Fund of knowledge is appropriate.  Cranial nerves: There is good facial symmetry. There is mild facial hypomimia.  There are no square wave jerks.  The visual fields are full to confrontational testing. The speech is fluent and clear. Soft palate rises symmetrically and there is no tongue deviation. Hearing is intact to conversational tone. Sensation: Sensation is intact to light touch throughout. Motor: Strength is 5/5 in the bilateral upper and lower extremities.   Shoulder shrug is equal and  symmetric.  There is no pronator drift.   Movement examination: Tone: There is normal tone in the bilateral UE Abnormal movements: There is no resting tremor today at all; There is moderate dyskinesia in the legs.   Coordination:  There is good RAM's today Gait and Station: The patient has no significant difficulty arising out of a deep-seated chair without the use of the hands. She has normal arm swing bilaterally today.  The patient's stride length is normal.  Negative pull test.  Labs  Lab Results  Component Value Date   VITAMINB12 831 02/09/2014     ASSESSMENT/PLAN:  1.  Idiopathic Parkinson's disease, diagnosed in 2009.  -She will remain on Mirapex, 1.5 mg 3 times per day.  No compulsive behaviors.    -continue on the carbidopa/levodopa 25/100 tid.  Has dyskinesia today but she doesn't notice it.    This was RF today  -talked to her about comtan as she is c/o 2nd dose wearing off an hour early but she feels she is doing really good so decided to hold on that for now.  In addition, it could increase her dyskinesia.    -We again talked about the importance of safe, cardiovascular exercise and she is walking on her aerodyne and walking with her friends.  -she met our PD social worker today. 2.  REM behavior disorder.  -Overall mild.  Just consists of occasional yelling out at night.  She is to be on clonazepam but is no longer.  We'll just keep an eye on this.  Doesn't want to add medication 3.  History of B12 deficiency and peripheral neuropathy, per Dr. love/[facility] records.  -Did not see significant evidence of peripheral neuropathy on examination today, but this certainly could contribute to balance issues.  She really  has no significant balance problems yet, however.  She is off of injections now but on B12 orally 4.  Much greater than 50% of this visit was spent in counseling with the patient.  Total face to face time:  25 min

## 2015-11-16 ENCOUNTER — Ambulatory Visit (INDEPENDENT_AMBULATORY_CARE_PROVIDER_SITE_OTHER): Payer: Medicare HMO | Admitting: Neurology

## 2015-11-16 ENCOUNTER — Encounter: Payer: Self-pay | Admitting: Neurology

## 2015-11-16 VITALS — BP 106/70 | HR 56 | Ht 65.0 in | Wt 177.0 lb

## 2015-11-16 DIAGNOSIS — G20 Parkinson's disease: Secondary | ICD-10-CM | POA: Diagnosis not present

## 2015-11-16 DIAGNOSIS — G249 Dystonia, unspecified: Secondary | ICD-10-CM

## 2015-11-16 NOTE — Progress Notes (Addendum)
Psychosocial/Social Work Assessment [facility], [facility]  Patient: Carly [name] MRN: [id number] Date of Assessment: 11/16/2015  PHYSICAL/MEDICAL SITUATION- As Verbally Reported by Patient and/or Carepartner   Current Movement Disorder Diagnosis?: Idiopathic Parkinson's Disease   Goal(s) for Attending Clinic:  Follow-up visit   DEMOGRAPHIC INFORMATION  Race/Ethnicity: Caucasian   Age: 70 y.o.  Relationship Status    Other: female partner-together 46 years      Living Situation     Lives with partner       [place]   Oriented to: Person, Place, Time, & Situation  Comments:    Any cognitive changes (as reported by patient or care partner)? None reported       AMBULATION   No balance issues:     AMBULATION ASSISTANCE   Walks independently:   Activities of Daily Living (ADLs)  Independent in activities of daily living?  Yes  No  Some   How many stories is the home?  3 story home On what story are primary living areas? Pt reports utilizing all stories of home  Feel safe in the home? Yes    Driving  Currently driving?  Yes    EXERCISE AND ACTIVITY ENGAGEMENT  Information about Current Exercise    Types of exercise: Walking  Frequency: Daily  Level of Activity and Engagement     Frequently busy     Current and Past Hobbies: Patient walks daily with 5 friends, pt states that she has a [place] house in [place] and visits often and walks during her visits there. Pt reports past hobby of cycling.    MOOD, COPING, AND SUPPORT        Mood and Affect      Generally happy or easy going     Other:       Coping Method  Talk with family  Talk to friends  Attends support group?  (Yes (Y) or No (N)) No  Interested in information on local support groups? Not at this time, pt lives in [place] live nearby? Yes  Support System    Good  Poor  Other/Comments: Pt reports strong support from  family/friends          SOCIAL WORK ASSESSMENT  CSW met with pt during follow up visit today at the request of Dr. Carles Collet. CSW met with pt in exam room. CSW introduced self and explained role. Pt discussed that she lives with her female partner in a three story home in [place], [place]. Pt shared that her partner of 15 years has 2 daughters and she has one son.   Supportive listening provided as pt discussed that she does not recall the year that she began noticing symptoms of PD, but reflects that it was around the time she was going through a divorce and describes it as "everything happened at once". Pt reflected that she used to be an avid runner and cyclist prior to her PD diagnosis. Pt discussed that she is exercising more now, but at one time was not exercising as much and describes being able to tell an overall positive difference with increased exercise. Pt shared that she walks daily with five friends and feels that climbing the stairs in her three story home is beneficial. CSW inquired with pt if she was aware of the [facility] cycle for PD program and pt stated that she had not yet heard about the program and agreeable to CSW  providing information. CSW provided positive reinforcement to pt surrounding her exercising.   Pt reports that with living in [place] that it can be to far to come to some events in [place], [place], but is open to [place] updating pt on support groups and events. Pt reports that she has participated in the [employer] for Parkinson's with family and is passionate about fundraiser's that will benefit individuals with Parkinson's Disease. CSW provided pt with information on the upcoming [employer] to honor individuals with Parkinson's Disease.   CSW provided pt with CSW contact information and encouraged pt to contact CSW if any needs arise before next visit.   Education Materials Provided: [employer] Social research officer, government provided   Social Work Treatment Plan:   1. CSW to remain available for support and connection to resources as needed. 2. Rayleen Wyrick has been informed of social work services and has agreed to contact me if I can be of further assistance with any other social work services.  5. The information contained in this note has been reported to, and discussed with, Dr. Carles Collet.

## 2016-01-12 ENCOUNTER — Other Ambulatory Visit: Payer: Self-pay | Admitting: Neurology

## 2016-02-16 ENCOUNTER — Encounter: Payer: Medicare HMO | Admitting: Family Medicine

## 2016-03-02 ENCOUNTER — Telehealth: Payer: Self-pay

## 2016-03-02 NOTE — Telephone Encounter (Signed)
LM requesting call back to r/s AWV. Please schedule with Selena BattenKim (on any Wednesday) if patient agreeable.

## 2016-04-13 NOTE — Progress Notes (Signed)
Carly [name] was seen today in the movement disorders clinic for neurologic consultation at the request of Carly Asa, MD.  The consultation is for the evaluation of PD.  Pt is a former pt of [name] and [name].  I reviewed the prior records that were available to me.  The patient began to notice right hand tremor in 2009 with associated right arm stiffness.  She was initially started on selegiline and later pramipexole, which was worked up to 1 mg 3 times a day.  They did try Mirapex ER 3 mg for a short period of time, but her insurance quit paying for that.  Pt states that she loved it when she was on it once per day but it was just too expensive.  She went back to mirapex 1 mg tid.  She has trouble remembering the middle of the day mirapex but it reminds her with tremor and stiffness.   She was on Azilect, but it was also discontinued because of cost and it didn't help.  In July, 2014 carbidopa/levodopa 50/200 was added at night for cramping and RLS.  This helped, but the patient complained of some dizziness so this was decreased to carbidopa/levodopa 25/100 CR at night.  This was discontinued in May, 2015 as the patient's cramping seemed better and RLS was better.  Pt isn't sure that she even took it that long.  Pt is not currently exercising faithfully; in the past she was a marathon runner and was very competitive with sports.  She has never been to the PT program at the neurorehab center ([place]ives between here and [place]).  06/08/14 update:  Pt has a hx of PD and last visit I increased her mirapex to 1.5 mg tid.  Going up on the dosage did help.   Pt has been under a lot of stress.  Her partner of 15 years found out he has prostate CA and some friends have died.  She has noted that stress increases tremor.  She moved a piece of heavy furniture after we last met and her L sciatica flared and she just got better and then she moved something again and injured her right groin.  This has  prevented exercise.  No lightheadness/near syncope.    09/08/14 update:  Pt returns for follow up.  She is on mirapex 1.5 mg tid.  She is c/o more freezing.  She states that she can get up and turn to the right but when trying to turn to the left, she has start hesitation/freezing.  No falls.  No lightheadness/near syncope.  She is walking and riding her bike some.    11/27/14 update:  Pt is on mirapex 1.5 tid and last visit I started her on carbidopa/levodopa 25/100 tid.  She is no longer having freezing spells and states that she is doing better than she expected.  She exercises some.  She is talking and dreaming more than she was; she is not falling out of bed.  States that her best friends husband just died and she thinks that caused more of the dreams.  03/16/15 update:  The patient is following up today regarding her Parkinson's disease.  She is on pramipexole, 1-1/2 mg 3 times a day in addition to carbidopa/levodopa 25/100, one tablet 3 times per day.  She is exercising and she is switching insurance and that will pay for silver sneakers so she will be able to go back to the gym.  She denies any  falls since our last visit.  No hallucinations.  Rare visual distortions.  No lightheadedness or near syncope.  She remains on oral B12 supplements for a history of B12 deficiency.  06/15/15 update:  The patient is following up today regarding her Parkinson's disease.  She is on pramipexole, 1.5 mg 3 times a day in addition to carbidopa/levodopa 25/100, one tablet 3 times per day.  States that if she is late with her carbidopa/levodopa 25/100, she can hardly move.  No falls since last visit.  No hallucinations.  No lightheadedness or syncope.  Sleeping well.  States that her significant other had prostate CA and then had MI and then had CABG so she has been exercising a little less.  She is riding her bike.  In regards to RBD, she states that she was doing good until this week and she woke up sitting up yelling  and trying to shoot something.  11/16/15 update:  The patient followed up today regarding her Parkinson's disease.  She remains on pramipexole, 1.5 mg 3 times per day and carbidopa/levodopa 25 mg 3 times per day.  States that 2nd dose seems to wear off about an hour before the 3rd dose.  Overall, doing and feeling well.  "I'm feeling good."  She saw Dr. Birdie Riddle in May, and I reviewed those records.  The patient denies any falls since last visit.  She continues to exercise.  She is riding her stationary bike.  She denies any falls since last visit.  No lightheadedness.  No syncope.  No hallucinations.  04/18/16 update:  Patient follows up today.  She remains on pramipexole 1.'5mg'$  3 times per day.  She is also on carbidopa/levodopa 25/100, one tablet 3 times per day. Notes that when medication wears off she gets very tired.  Usually isn't much before next dose.  Over the holiday, she dosed her medication later so that she could have a late dinner party.  Noted more "swaying"/dyskinesia if gets anxious or nervous.   Pt denies falls.  Pt denies lightheadedness, near syncope.  No hallucinations.  Mood has been good.   PREVIOUS MEDICATIONS: Sinemet CR, Mirapex and azilect and selegeline  ALLERGIES:   Allergies  Allergen Reactions  . Codeine     REACTION: nausea    CURRENT MEDICATIONS:  Outpatient Encounter Prescriptions as of 04/18/2016  Medication Sig  . carbidopa-levodopa (SINEMET IR) 25-100 MG tablet Take 1 tablet by mouth 3 (three) times daily.  . hydrochlorothiazide (MICROZIDE) 12.5 MG capsule TAKE ONE CAPSULE BY MOUTH DAILY  . pramipexole (MIRAPEX) 1 MG tablet TAKE 1 AND 1/2 TABLETS BY MOUTH THREE TIMES A DAY  . ALPRAZolam (XANAX) 0.25 MG tablet Take 1 tablet (0.25 mg total) by mouth as needed for anxiety. (Patient not taking: Reported on 04/18/2016)  . [DISCONTINUED] Cholecalciferol (VITAMIN D3) 5000 UNITS CAPS Take by mouth.  . [DISCONTINUED] Cyanocobalamin (VITAMIN B-12) 2000 MCG TBCR Take by  mouth.  . [DISCONTINUED] hydrochlorothiazide (MICROZIDE) 12.5 MG capsule TAKE ONE CAPSULE BY MOUTH DAILY  . [DISCONTINUED] pramipexole (MIRAPEX) 1 MG tablet TAKE 1 AND 1/2 TABLET THREE TIMES A DAY   No facility-administered encounter medications on file as of 04/18/2016.     PAST MEDICAL HISTORY:   Past Medical History:  Diagnosis Date  . Anxiety   . Hypertension   . Parkinson's disease ([place])   . Vitamin D deficiency     PAST SURGICAL HISTORY:   Past Surgical History:  Procedure Laterality Date  . REFRACTIVE SURGERY Bilateral  SOCIAL HISTORY:   Social History   Social History  . Marital status: Married    Spouse name: N/A  . Number of children: 1  . Years of education: N/A   Occupational History  . unempolyed     Social History Main Topics  . Smoking status: Never Smoker  . Smokeless tobacco: Never Used  . Alcohol use No  . Drug use: No  . Sexual activity: Not on file   Other Topics Concern  . Not on file   Social History Narrative   Patient lives at home with partner Carly [name].    Patient has one adult child.    Patient has 14 years of education.    Patient does not work.   Caffeine consumption is 1 cup daily     FAMILY HISTORY:   Family Status  Relation Status  . Father Deceased at age 38   heart attack, smoker  . Mother Deceased at age 32   heart   . Brother Deceased   brain tumor  . Brother Deceased   bladder cancer, smoker, ETOH  . Sister Alive   stroke  . Sister Deceased   healthy  . Son Alive   arthritis     ROS:  A complete 10 system review of systems was obtained and was unremarkable apart from what is mentioned above.  PHYSICAL EXAMINATION:    VITALS:   Vitals:   04/18/16 1045  BP: 130/74  Pulse: 68  SpO2: 98%  Weight: 175 lb (79.4 kg)  Height: '5\' 5"'$  (1.651 m)    GEN:  The patient appears stated age and is in NAD. HEENT:  Normocephalic, atraumatic.  The mucous membranes are moist. The superficial temporal arteries are  without ropiness or tenderness. CV:  RRR w PVC's Lungs:  CTAB Neck/HEME:  There are no carotid bruits bilaterally.  Neurological examination:  Orientation: The patient is alert and oriented x3. Fund of knowledge is appropriate.  Cranial nerves: There is good facial symmetry. There is mild facial hypomimia.  There are no square wave jerks.  The visual fields are full to confrontational testing. The speech is fluent and clear. Soft palate rises symmetrically and there is no tongue deviation. Hearing is intact to conversational tone. Sensation: Sensation is intact to light touch throughout. Motor: Strength is 5/5 in the bilateral upper and lower extremities.   Shoulder shrug is equal and symmetric.  There is no pronator drift.   Movement examination: Tone: There is normal tone in the bilateral UE Abnormal movements: There is no resting tremor today at all; There is moderate dyskinesia in the legs.   Coordination:  There is good RAM's today Gait and Station: The patient has no significant difficulty arising out of a deep-seated chair without the use of the hands. She has normal arm swing bilaterally today.  The patient's stride length is normal.  Negative pull test.  Labs  Lab Results  Component Value Date   VITAMINB12 831 02/09/2014     ASSESSMENT/PLAN:  1.  Idiopathic Parkinson's disease, diagnosed in 2009.  She is experiencing motor fluctuations including dyskinesia and wearing off  -She will remain on Mirapex, 1.5 mg 3 times per day.  No compulsive behaviors.    -continue on the carbidopa/levodopa 25/100 tid.  Has dyskinesia today but she doesn't notice it.    This was RF today  -start amantadine for dyskinesia.  Talked to her about risk of livedo reticularis.  Risks, benefits, side effects and alternative therapies were  discussed.  The opportunity to ask questions was given and they were answered to the best of my ability.  The patient expressed understanding and willingness to  follow the outlined treatment protocols.  Take amantadine 100 mg tid  -talked about DBS given having motor fluctuations.  She asked many questions about this today and I answered them to the best of my ability.    Pt video given today  -told her that if she is having a long day she can take an extra half tablet of levodopa or full tablet if needed.    -We again talked about the importance of safe, cardiovascular exercise and she is walking on her aerodyne and walking with her friends.  -she met our PD social worker today. 2.  REM behavior disorder.  -Overall mild.  Just consists of occasional yelling out at night.  She is to be on clonazepam but is no longer.  We'll just keep an eye on this.  Doesn't want to add medication 3.  History of B12 deficiency and peripheral neuropathy, per Dr. love/[facility] records.  -Did not see significant evidence of peripheral neuropathy on examination today, but this certainly could contribute to balance issues.  She really has no significant balance problems yet, however.  She is off of injections now but on B12 orally 4.  Much greater than 50% of this visit was spent in counseling with the patient.  Total face to face time:  40 min

## 2016-04-14 ENCOUNTER — Other Ambulatory Visit: Payer: Self-pay | Admitting: Family Medicine

## 2016-04-14 ENCOUNTER — Other Ambulatory Visit: Payer: Self-pay | Admitting: Neurology

## 2016-04-18 ENCOUNTER — Encounter: Payer: Self-pay | Admitting: Neurology

## 2016-04-18 ENCOUNTER — Ambulatory Visit (INDEPENDENT_AMBULATORY_CARE_PROVIDER_SITE_OTHER): Payer: Medicare HMO | Admitting: Neurology

## 2016-04-18 VITALS — BP 130/74 | HR 68 | Ht 65.0 in | Wt 175.0 lb

## 2016-04-18 DIAGNOSIS — G20 Parkinson's disease: Secondary | ICD-10-CM | POA: Diagnosis not present

## 2016-04-18 DIAGNOSIS — G249 Dystonia, unspecified: Secondary | ICD-10-CM | POA: Diagnosis not present

## 2016-04-18 MED ORDER — AMANTADINE HCL 100 MG PO CAPS: 100.0000 mg | ORAL_CAPSULE | Freq: Three times a day (TID) | ORAL | 1 refills | Status: DC

## 2016-04-18 NOTE — Addendum Note (Signed)
Addended byLiliana Cline: MCCRACKEN, JADE L on: 04/18/2016 11:13 AM   Modules accepted: Orders

## 2016-04-18 NOTE — Patient Instructions (Signed)
1. Start Amantadine one tablet three times daily.

## 2016-05-15 ENCOUNTER — Other Ambulatory Visit: Payer: Self-pay | Admitting: Neurology

## 2016-06-30 ENCOUNTER — Ambulatory Visit: Payer: Medicare HMO | Admitting: Family Medicine

## 2016-07-06 ENCOUNTER — Other Ambulatory Visit: Payer: Self-pay | Admitting: Neurology

## 2016-07-18 NOTE — Progress Notes (Signed)
Carly [name] was seen today in the movement disorders clinic for neurologic consultation at the request of Carly Asa, MD.  The consultation is for the evaluation of PD.  Pt is a former pt of Dr. Erling [name] and Dr. Janann [name].  I reviewed the prior records that were available to me.  The patient began to notice right hand tremor in 2009 with associated right arm stiffness.  She was initially started on selegiline and later pramipexole, which was worked up to 1 mg 3 times a day.  They did try Mirapex ER 3 mg for a short period of time, but her insurance quit paying for that.  Pt states that she loved it when she was on it once per day but it was just too expensive.  She went back to mirapex 1 mg tid.  She has trouble remembering the middle of the day mirapex but it reminds her with tremor and stiffness.   She was on Azilect, but it was also discontinued because of cost and it didn't help.  In July, 2014 carbidopa/levodopa 50/200 was added at night for cramping and RLS.  This helped, but the patient complained of some dizziness so this was decreased to carbidopa/levodopa 25/100 CR at night.  This was discontinued in May, 2015 as the patient's cramping seemed better and RLS was better.  Pt isn't sure that she even took it that long.  Pt is not currently exercising faithfully; in the past she was a marathon runner and was very competitive with sports.  She has never been to the PT program at the [facility] (lives between here and [place]).  06/08/14 update:  Pt has a hx of PD and last visit I increased her mirapex to 1.5 mg tid.  Going up on the dosage did help.   Pt has been under a lot of stress.  Her partner of 15 years found out he has prostate CA and some friends have died.  She has noted that stress increases tremor.  She moved a piece of heavy furniture after we last met and her L sciatica flared and she just got better and then she moved something again and injured her right groin.  This has  prevented exercise.  No lightheadness/near syncope.    09/08/14 update:  Pt returns for follow up.  She is on mirapex 1.5 mg tid.  She is c/o more freezing.  She states that she can get up and turn to the right but when trying to turn to the left, she has start hesitation/freezing.  No falls.  No lightheadness/near syncope.  She is walking and riding her bike some.    11/27/14 update:  Pt is on mirapex 1.5 tid and last visit I started her on carbidopa/levodopa 25/100 tid.  She is no longer having freezing spells and states that she is doing better than she expected.  She exercises some.  She is talking and dreaming more than she was; she is not falling out of bed.  States that her best friends husband just died and she thinks that caused more of the dreams.  03/16/15 update:  The patient is following up today regarding her Parkinson's disease.  She is on pramipexole, 1-1/2 mg 3 times a day in addition to carbidopa/levodopa 25/100, one tablet 3 times per day.  She is exercising and she is switching insurance and that will pay for silver sneakers so she will be able to go back to the gym.  She denies any  falls since our last visit.  No hallucinations.  Rare visual distortions.  No lightheadedness or near syncope.  She remains on oral B12 supplements for a history of B12 deficiency.  06/15/15 update:  The patient is following up today regarding her Parkinson's disease.  She is on pramipexole, 1.5 mg 3 times a day in addition to carbidopa/levodopa 25/100, one tablet 3 times per day.  States that if she is late with her carbidopa/levodopa 25/100, she can hardly move.  No falls since last visit.  No hallucinations.  No lightheadedness or syncope.  Sleeping well.  States that her significant other had prostate CA and then had MI and then had CABG so she has been exercising a little less.  She is riding her bike.  In regards to RBD, she states that she was doing good until this week and she woke up sitting up yelling  and trying to shoot something.  11/16/15 update:  The patient followed up today regarding her Parkinson's disease.  She remains on pramipexole, 1.5 mg 3 times per day and carbidopa/levodopa 25 mg 3 times per day.  States that 2nd dose seems to wear off about an hour before the 3rd dose.  Overall, doing and feeling well.  "I'm feeling good."  She saw Dr. Birdie [name] in May, and I reviewed those records.  The patient denies any falls since last visit.  She continues to exercise.  She is riding her stationary bike.  She denies any falls since last visit.  No lightheadedness.  No syncope.  No hallucinations.  04/18/16 update:  Patient follows up today.  She remains on pramipexole 1.32m 3 times per day.  She is also on carbidopa/levodopa 25/100, one tablet 3 times per day. Notes that when medication wears off she gets very tired.  Usually isn't much before next dose.  Over the holiday, she dosed her medication later so that she could have a late dinner party.  Noted more "swaying"/dyskinesia if gets anxious or nervous.   Pt denies falls.  Pt denies lightheadedness, near syncope.  No hallucinations.  Mood has been good.  07/19/16 update:  Patient seen today in follow-up.  She remains on pramipexole, 1.5 mg 3 times per day and carbidopa/levodopa 25/100, one tablet 3 times per day.  We started amantadine last visit, 100 mg 3 times per day due to dyskinesia.  The patient states that she took it for 2 days and it made her lightheaded.  She isn't sure if it helped the dyskinesia.  She read the things I gave her on DBS and talked to others who had it.     PREVIOUS MEDICATIONS: Sinemet CR, Mirapex and azilect and selegeline; amantadine (lightheaded)  ALLERGIES:   Allergies  Allergen Reactions  . Codeine     REACTION: nausea    CURRENT MEDICATIONS:  Outpatient Encounter Prescriptions as of 07/19/2016  Medication Sig  . ALPRAZolam (XANAX) 0.25 MG tablet Take 1 tablet (0.25 mg total) by mouth as needed for anxiety.  .  carbidopa-levodopa (SINEMET IR) 25-100 MG tablet TAKE ONE TABLET THREE TIMES A DAY  . hydrochlorothiazide (MICROZIDE) 12.5 MG capsule TAKE ONE CAPSULE BY MOUTH DAILY  . pramipexole (MIRAPEX) 1 MG tablet TAKE 1 AND 1/2 TABLETS BY MOUTH THREE TIMES A DAY  . [DISCONTINUED] amantadine (SYMMETREL) 100 MG capsule Take 1 capsule (100 mg total) by mouth 3 (three) times daily.   No facility-administered encounter medications on file as of 07/19/2016.     PAST MEDICAL HISTORY:   Past  Medical History:  Diagnosis Date  . Anxiety   . Hypertension   . Parkinson's disease ([facility])   . Vitamin D deficiency     PAST SURGICAL HISTORY:   Past Surgical History:  Procedure Laterality Date  . REFRACTIVE SURGERY Bilateral     SOCIAL HISTORY:   Social History   Social History  . Marital status: Married    Spouse name: N/A  . Number of children: 1  . Years of education: N/A   Occupational History  . unempolyed     Social History Main Topics  . Smoking status: Never Smoker  . Smokeless tobacco: Never Used  . Alcohol use No  . Drug use: No  . Sexual activity: Not on file   Other Topics Concern  . Not on file   Social History Narrative   Patient lives at home with partner [name].    Patient has one adult child.    Patient has 14 years of education.    Patient does not work.   Caffeine consumption is 1 cup daily     FAMILY HISTORY:   Family Status  Relation Status  . Father Deceased at age 18   heart attack, smoker  . Mother Deceased at age 44   heart   . Brother Deceased   brain tumor  . Brother Deceased   bladder cancer, smoker, ETOH  . Sister Alive   stroke  . Sister Deceased   healthy  . Son Alive   arthritis     ROS:  A complete 10 system review of systems was obtained and was unremarkable apart from what is mentioned above.  PHYSICAL EXAMINATION:    VITALS:   Vitals:   07/19/16 1002  BP: 120/68  Pulse: 68  SpO2: 98%  Weight: 172 lb (78 kg)  Height: _0   (1.651 m)    GEN:  The patient appears stated age and is in NAD. HEENT:  Normocephalic, atraumatic.  The mucous membranes are moist. The superficial temporal arteries are without ropiness or tenderness. CV:  RRR w PVC's Lungs:  CTAB Neck/HEME:  There are no carotid bruits bilaterally.  Neurological examination:  Orientation: The patient is alert and oriented x3. Fund of knowledge is appropriate.  Cranial nerves: There is good facial symmetry. There is mild facial hypomimia.  There are no square wave jerks.  The visual fields are full to confrontational testing. The speech is fluent and clear. Soft palate rises symmetrically and there is no tongue deviation. Hearing is intact to conversational tone. Sensation: Sensation is intact to light touch throughout. Motor: Strength is 5/5 in the bilateral upper and lower extremities.   Shoulder shrug is equal and symmetric.  There is no pronator drift.   Movement examination: Tone: There is normal tone in the bilateral UE Abnormal movements: There is no resting tremor today at all; There is moderate dyskinesia in the legs.   Coordination:  There is good RAM's today Gait and Station: The patient has no significant difficulty arising out of a deep-seated chair without the use of the hands. She has normal arm swing bilaterally today.  The patient's stride length is normal.  Negative pull test.  Labs  Lab Results  Component Value Date   VITAMINB12 831 02/09/2014     ASSESSMENT/PLAN:  1.  Idiopathic Parkinson's disease, diagnosed in 2009.  She is experiencing motor fluctuations including dyskinesia and wearing off  -She will remain on Mirapex, 1.5 mg 3 times per day.  No compulsive  behaviors.    -continue on the carbidopa/levodopa 25/100 tid.   This was RF today  -failed amantadine and talked to her about gocovri (amantadine ER).  She would like to try.  Start gocovri 127m daily at bed for a week and then 2 capsules (274 mg) at bedtime  thereafter   -talked about DBS given having motor fluctuations.  She asked many questions about this again today and I answered them to the best of my ability.     -invite given to symposium  -We again talked about the importance of safe, cardiovascular exercise and she is walking on her aerodyne and walking with her friends. 2.  REM behavior disorder.  -Overall mild.  Just consists of occasional yelling out at night.  She used to be on clonazepam but is no longer.  We'll just keep an eye on this.  Doesn't want to add medication 3.  History of B12 deficiency and peripheral neuropathy, per Dr. love/[facility] records.  -Did not see significant evidence of peripheral neuropathy on examination today, but this certainly could contribute to balance issues.  She really has no significant balance problems yet, however.  She is off of injections now but on B12 orally 4.  Much greater than 50% of this visit was spent in counseling with the patient.  Total face to face time:  45 min

## 2016-07-19 ENCOUNTER — Ambulatory Visit (INDEPENDENT_AMBULATORY_CARE_PROVIDER_SITE_OTHER): Payer: Medicare HMO | Admitting: Neurology

## 2016-07-19 ENCOUNTER — Encounter: Payer: Self-pay | Admitting: Neurology

## 2016-07-19 VITALS — BP 120/68 | HR 68 | Ht 65.0 in | Wt 172.0 lb

## 2016-07-19 DIAGNOSIS — G20 Parkinson's disease: Secondary | ICD-10-CM | POA: Diagnosis not present

## 2016-07-19 DIAGNOSIS — G4752 REM sleep behavior disorder: Secondary | ICD-10-CM | POA: Diagnosis not present

## 2016-07-19 DIAGNOSIS — G249 Dystonia, unspecified: Secondary | ICD-10-CM | POA: Diagnosis not present

## 2016-07-19 NOTE — Patient Instructions (Signed)
Start gocovri  daily at bed for a week and then 2 capsules (274 mg) at bedtime thereafter

## 2016-07-20 ENCOUNTER — Other Ambulatory Visit: Payer: Self-pay | Admitting: Family Medicine

## 2016-08-11 ENCOUNTER — Telehealth: Payer: Self-pay | Admitting: Neurology

## 2016-08-11 NOTE — Telephone Encounter (Signed)
Patient left message on voice mail about medication question that DR Tat wanted her to try. Not shure what the name of the medication is

## 2016-08-14 NOTE — Telephone Encounter (Signed)
Left message on machine for patient to call back.

## 2016-09-15 ENCOUNTER — Other Ambulatory Visit: Payer: Self-pay | Admitting: Neurology

## 2016-10-16 NOTE — Progress Notes (Signed)
Carly [name] was seen today in the movement disorders clinic for neurologic consultation at the request of [name], [name], MD.  The consultation is for the evaluation of PD.  Pt is a former pt of [name] and [name].  I reviewed the prior records that were available to me.  The patient began to notice right hand tremor in 2009 with associated right arm stiffness.  She was initially started on selegiline and later pramipexole, which was worked up to 1 mg 3 times a day.  They did try Mirapex ER 3 mg for a short period of time, but her insurance quit paying for that.  Pt states that she loved it when she was on it once per day but it was just too expensive.  She went back to mirapex 1 mg tid.  She has trouble remembering the middle of the day mirapex but it reminds her with tremor and stiffness.   She was on Azilect, but it was also discontinued because of cost and it didn't help.  In July, 2014 carbidopa/levodopa 50/200 was added at night for cramping and RLS.  This helped, but the patient complained of some dizziness so this was decreased to carbidopa/levodopa 25/100 CR at night.  This was discontinued in May, 2015 as the patient's cramping seemed better and RLS was better.  Pt isn't sure that she even took it that long.  Pt is not currently exercising faithfully; in the past she was a marathon runner and was very competitive with sports.  She has never been to the PT program at the [facility] center (lives between here and [place]).  06/08/14 update:  Pt has a hx of PD and last visit I increased her mirapex to 1.5 mg tid.  Going up on the dosage did help.   Pt has been under a lot of stress.  Her partner of 15 years found out he has prostate CA and some friends have died.  She has noted that stress increases tremor.  She moved a piece of heavy furniture after we last met and her L sciatica flared and she just got better and then she moved something again and injured her right groin.  This has  prevented exercise.  No lightheadness/near syncope.    09/08/14 update:  Pt returns for follow up.  She is on mirapex 1.5 mg tid.  She is c/o more freezing.  She states that she can get up and turn to the right but when trying to turn to the left, she has start hesitation/freezing.  No falls.  No lightheadness/near syncope.  She is walking and riding her bike some.    11/27/14 update:  Pt is on mirapex 1.5 tid and last visit I started her on carbidopa/levodopa 25/100 tid.  She is no longer having freezing spells and states that she is doing better than she expected.  She exercises some.  She is talking and dreaming more than she was; she is not falling out of bed.  States that her best friends husband just died and she thinks that caused more of the dreams.  03/16/15 update:  The patient is following up today regarding her Parkinson's disease.  She is on pramipexole, 1-1/2 mg 3 times a day in addition to carbidopa/levodopa 25/100, one tablet 3 times per day.  She is exercising and she is switching insurance and that will pay for silver sneakers so she will be able to go back to the gym.  She denies  any falls since our last visit.  No hallucinations.  Rare visual distortions.  No lightheadedness or near syncope.  She remains on oral B12 supplements for a history of B12 deficiency.  06/15/15 update:  The patient is following up today regarding her Parkinson's disease.  She is on pramipexole, 1.5 mg 3 times a day in addition to carbidopa/levodopa 25/100, one tablet 3 times per day.  States that if she is late with her carbidopa/levodopa 25/100, she can hardly move.  No falls since last visit.  No hallucinations.  No lightheadedness or syncope.  Sleeping well.  States that her significant other had prostate CA and then had MI and then had CABG so she has been exercising a little less.  She is riding her bike.  In regards to RBD, she states that she was doing good until this week and she woke up sitting up yelling  and trying to shoot something.  11/16/15 update:  The patient followed up today regarding her Parkinson's disease.  She remains on pramipexole, 1.5 mg 3 times per day and carbidopa/levodopa 25 mg 3 times per day.  States that 2nd dose seems to wear off about an hour before the 3rd dose.  Overall, doing and feeling well.  "I'm feeling good."  She saw Dr. Birdie Riddle in May, and I reviewed those records.  The patient denies any falls since last visit.  She continues to exercise.  She is riding her stationary bike.  She denies any falls since last visit.  No lightheadedness.  No syncope.  No hallucinations.  04/18/16 update:  Patient follows up today.  She remains on pramipexole 1.'5mg'$  3 times per day.  She is also on carbidopa/levodopa 25/100, one tablet 3 times per day. Notes that when medication wears off she gets very tired.  Usually isn't much before next dose.  Over the holiday, she dosed her medication later so that she could have a late dinner party.  Noted more "swaying"/dyskinesia if gets anxious or nervous.   Pt denies falls.  Pt denies lightheadedness, near syncope.  No hallucinations.  Mood has been good.  07/19/16 update:  Patient seen today in follow-up.  She remains on pramipexole, 1.5 mg 3 times per day and carbidopa/levodopa 25/100, one tablet 3 times per day.  We started amantadine last visit, 100 mg 3 times per day due to dyskinesia.  The patient states that she took it for 2 days and it made her lightheaded.  She isn't sure if it helped the dyskinesia.  She read the things I gave her on DBS and talked to others who had it.    10/18/16 update:  Patient seen today in follow-up.  She is on pramipexole, 1.5 mg 3 times per day.  She denies compulsive behaviors.  She denies hallucinations.  She denies sleep attacks.  She is also on carbidopa/levodopa 25/100, one tablet 3 times per day.  She was given gocovri last visit but she never started that because it was very expensive and her insurance wouldn't pay  any of it and it was $500.   She is off of her B12 pills too.  She is walking for exercise   PREVIOUS MEDICATIONS: Sinemet CR, Mirapex and azilect and selegeline; amantadine (lightheaded)  ALLERGIES:   Allergies  Allergen Reactions  . Codeine     REACTION: nausea    CURRENT MEDICATIONS:  Outpatient Encounter Prescriptions as of 10/18/2016  Medication Sig  . ALPRAZolam (XANAX) 0.25 MG tablet Take 1 tablet (0.25 mg total)  by mouth as needed for anxiety.  . carbidopa-levodopa (SINEMET IR) 25-100 MG tablet TAKE ONE TABLET THREE TIMES A DAY  . hydrochlorothiazide (MICROZIDE) 12.5 MG capsule TAKE ONE CAPSULE BY MOUTH DAILY  . pramipexole (MIRAPEX) 1 MG tablet TAKE 1 AND 1/2 TABLETS BY MOUTH THREE TIMES A DAY   No facility-administered encounter medications on file as of 10/18/2016.     PAST MEDICAL HISTORY:   Past Medical History:  Diagnosis Date  . Anxiety   . Hypertension   . Parkinson's disease ([facility])   . Vitamin D deficiency     PAST SURGICAL HISTORY:   Past Surgical History:  Procedure Laterality Date  . REFRACTIVE SURGERY Bilateral     SOCIAL HISTORY:   Social History   Social History  . Marital status: Married    Spouse name: N/A  . Number of children: 1  . Years of education: N/A   Occupational History  . unempolyed     Social History Main Topics  . Smoking status: Never Smoker  . Smokeless tobacco: Never Used  . Alcohol use No  . Drug use: No  . Sexual activity: Not on file   Other Topics Concern  . Not on file   Social History Narrative   Patient lives at home with partner Demetrios Loll.    Patient has one adult child.    Patient has 14 years of education.    Patient does not work.   Caffeine consumption is 1 cup daily     FAMILY HISTORY:   Family Status  Relation Status  . Father Deceased at age 69       heart attack, smoker  . Mother Deceased at age 61       heart   . Brother Deceased       brain tumor  . Brother Deceased       bladder  cancer, smoker, ETOH  . Sister Alive       stroke  . Sister Deceased       healthy  . Son Alive       arthritis     ROS:  A complete 10 system review of systems was obtained and was unremarkable apart from what is mentioned above.  PHYSICAL EXAMINATION:    VITALS:   Vitals:   10/18/16 0959  BP: 114/62  Pulse: (!) 54  SpO2: 97%  Weight: 172 lb (78 kg)  Height: 5\' 5"  (1.651 m)    GEN:  The patient appears stated age and is in NAD. HEENT:  Normocephalic, atraumatic.  The mucous membranes are moist. The superficial temporal arteries are without ropiness or tenderness. CV:  Bradycardic.  regular Lungs:  CTAB Neck/HEME:  There are no carotid bruits bilaterally.  Neurological examination:  Orientation: The patient is alert and oriented x3. Fund of knowledge is appropriate.  Cranial nerves: There is good facial symmetry. There is mild facial hypomimia.  There are no square wave jerks.  The visual fields are full to confrontational testing. The speech is fluent and clear. Soft palate rises symmetrically and there is no tongue deviation. Hearing is intact to conversational tone. Sensation: Sensation is intact to light touch throughout. Motor: Strength is 5/5 in the bilateral upper and lower extremities.   Shoulder shrug is equal and symmetric.  There is no pronator drift.   Movement examination: Tone: There is normal tone in the bilateral UE Abnormal movements: There is no resting tremor today at all; There is no dyskinesia Coordination:  There  is good RAM's today Gait and Station: The patient has no significant difficulty arising out of a deep-seated chair without the use of the hands. She has excessive arm swing bilaterally today.  The patient's stride length is normal.  Negative pull test.  Labs  Lab Results  Component Value Date   VITAMINB12 831 02/09/2014     ASSESSMENT/PLAN:  1.  Idiopathic Parkinson's disease, diagnosed in 2009.  She is experiencing motor  fluctuations including dyskinesia and wearing off  -She will remain on Mirapex, 1.5 mg 3 times per day.  No compulsive behaviors.    -continue on the carbidopa/levodopa 25/100 tid.     -failed amantadine but state that her insurance company wouldn't pay for gocovri.  She states that she only has lots of dyskinesia with anxiety  -talked about DBS given having motor fluctuations.  She asked many questions about this again today and I answered them to the best of my ability.    2.  REM behavior disorder.  -Overall mild.  Just consists of occasional yelling out at night.  She used to be on clonazepam but is no longer.  We'll just keep an eye on this.  Doesn't want to add medication 3.  History of B12 deficiency and peripheral neuropathy, per Dr. love/[facility] records.  -Did not see significant evidence of peripheral neuropathy on examination today, but this certainly could contribute to balance issues.  She really has no significant balance problems yet, however.  She is off of injections.  Needs to get back on her oral b12. 4.  Much greater than 50% of this visit was spent in counseling with the patient.  Total face to face time:  35 min

## 2016-10-18 ENCOUNTER — Encounter: Payer: Self-pay | Admitting: Neurology

## 2016-10-18 ENCOUNTER — Ambulatory Visit (INDEPENDENT_AMBULATORY_CARE_PROVIDER_SITE_OTHER): Payer: Medicare HMO | Admitting: Neurology

## 2016-10-18 VITALS — BP 114/62 | HR 54 | Ht 65.0 in | Wt 172.0 lb

## 2016-10-18 DIAGNOSIS — G20 Parkinson's disease: Secondary | ICD-10-CM

## 2016-10-18 DIAGNOSIS — G4752 REM sleep behavior disorder: Secondary | ICD-10-CM | POA: Diagnosis not present

## 2016-10-18 DIAGNOSIS — E538 Deficiency of other specified B group vitamins: Secondary | ICD-10-CM | POA: Diagnosis not present

## 2016-10-18 DIAGNOSIS — G249 Dystonia, unspecified: Secondary | ICD-10-CM | POA: Diagnosis not present

## 2016-10-18 NOTE — Patient Instructions (Signed)
Restart your oral B12  You look great and I'm proud of your hard work  Make your follow up appointment in 4 months!

## 2016-10-20 ENCOUNTER — Other Ambulatory Visit: Payer: Self-pay | Admitting: General Practice

## 2016-10-20 ENCOUNTER — Other Ambulatory Visit: Payer: Self-pay | Admitting: Family Medicine

## 2016-10-20 MED ORDER — HYDROCHLOROTHIAZIDE 12.5 MG PO CAPS: 12.5000 mg | ORAL_CAPSULE | Freq: Every day | ORAL | 0 refills | Status: DC

## 2016-10-30 ENCOUNTER — Ambulatory Visit: Payer: Medicare HMO | Admitting: Family Medicine

## 2016-11-01 ENCOUNTER — Encounter: Payer: Self-pay | Admitting: Family Medicine

## 2016-11-01 ENCOUNTER — Ambulatory Visit (INDEPENDENT_AMBULATORY_CARE_PROVIDER_SITE_OTHER): Payer: Medicare HMO | Admitting: Family Medicine

## 2016-11-01 ENCOUNTER — Ambulatory Visit: Payer: Medicare HMO

## 2016-11-01 ENCOUNTER — Encounter: Payer: Self-pay | Admitting: General Practice

## 2016-11-01 VITALS — BP 120/70 | HR 60 | Resp 18 | Ht 65.0 in | Wt 172.4 lb

## 2016-11-01 VITALS — BP 120/70 | HR 60 | Temp 97.6°F | Resp 18 | Ht 65.0 in | Wt 172.2 lb

## 2016-11-01 DIAGNOSIS — Z0000 Encounter for general adult medical examination without abnormal findings: Secondary | ICD-10-CM

## 2016-11-01 DIAGNOSIS — I10 Essential (primary) hypertension: Secondary | ICD-10-CM

## 2016-11-01 DIAGNOSIS — Z1159 Encounter for screening for other viral diseases: Secondary | ICD-10-CM

## 2016-11-01 LAB — CBC WITH DIFFERENTIAL/PLATELET
BASOS PCT: 0.9 % (ref 0.0–3.0)
Basophils Absolute: 0.1 10*3/uL (ref 0.0–0.1)
EOS PCT: 0.6 % (ref 0.0–5.0)
Eosinophils Absolute: 0 10*3/uL (ref 0.0–0.7)
HEMATOCRIT: 38.9 % (ref 36.0–46.0)
HEMOGLOBIN: 12.9 g/dL (ref 12.0–15.0)
LYMPHS PCT: 18.9 % (ref 12.0–46.0)
Lymphs Abs: 1.3 10*3/uL (ref 0.7–4.0)
MCHC: 33.2 g/dL (ref 30.0–36.0)
MCV: 92.8 fl (ref 78.0–100.0)
Monocytes Absolute: 0.3 10*3/uL (ref 0.1–1.0)
Monocytes Relative: 4.7 % (ref 3.0–12.0)
Neutro Abs: 5.2 10*3/uL (ref 1.4–7.7)
Neutrophils Relative %: 74.9 % (ref 43.0–77.0)
Platelets: 167 10*3/uL (ref 150.0–400.0)
RBC: 4.19 Mil/uL (ref 3.87–5.11)
RDW: 12.9 % (ref 11.5–15.5)
WBC: 6.9 10*3/uL (ref 4.0–10.5)

## 2016-11-01 LAB — LIPID PANEL
CHOL/HDL RATIO: 3
Cholesterol: 172 mg/dL (ref 0–200)
HDL: 58.9 mg/dL (ref >39.00)
LDL Cholesterol: 101 mg/dL — ABNORMAL HIGH (ref 0–99)
NONHDL: 113.01
Triglycerides: 62 mg/dL (ref 0.0–149.0)
VLDL: 12.4 mg/dL (ref 0.0–40.0)

## 2016-11-01 LAB — BASIC METABOLIC PANEL
BUN: 25 mg/dL — ABNORMAL HIGH (ref 6–23)
CALCIUM: 9.3 mg/dL (ref 8.4–10.5)
CHLORIDE: 102 meq/L (ref 96–112)
CO2: 30 mEq/L (ref 19–32)
CREATININE: 1.27 mg/dL — AB (ref 0.40–1.20)
GFR: 44.34 mL/min — ABNORMAL LOW (ref >60.00)
Glucose, Bld: 102 mg/dL — ABNORMAL HIGH (ref 70–99)
Potassium: 3.6 mEq/L (ref 3.5–5.1)
SODIUM: 138 meq/L (ref 135–145)

## 2016-11-01 LAB — HEPATIC FUNCTION PANEL
ALT: 4 U/L (ref 0–35)
AST: 16 U/L (ref 0–37)
Albumin: 4.1 g/dL (ref 3.5–5.2)
Alkaline Phosphatase: 73 U/L (ref 39–117)
BILIRUBIN TOTAL: 0.8 mg/dL (ref 0.2–1.2)
Bilirubin, Direct: 0.2 mg/dL (ref 0.0–0.3)
Total Protein: 6.9 g/dL (ref 6.0–8.3)

## 2016-11-01 LAB — TSH: TSH: 0.93 u[IU]/mL (ref 0.35–4.50)

## 2016-11-01 NOTE — Patient Instructions (Addendum)
Follow up in 1 year or as needed We'll notify you of your lab results and make any changes if needed Keep up the good work on healthy diet and regular exercise- you look great! Please complete the cologuard and return as directed Please have them send me copies of mammo/DEXA STOP the HCTZ daily- if BP again jumps up, let me know! Call with any questions or concerns Happy Belated Birthday!!!

## 2016-11-01 NOTE — Progress Notes (Addendum)
Subjective:   Carly [name] is a 71 y.o. female who presents for Medicare Annual (Subsequent) preventive examination.  Review of Systems:  No ROS.  Medicare Wellness Visit. Additional risk factors are reflected in the social history.  Cardiac Risk Factors include: family history of premature cardiovascular disease;advanced age (>3555men, 73>65 women);hypertension Sleep patterns: Sleeps 7-8 hours, feels rested. Up to void x 1.  Home Safety/Smoke Alarms: Feels safe in home. Smoke alarms in place.  Living environment; residence and Firearm Safety: Lives with boyfriend in 3 story home.  Seat Belt Safety/Bike Helmet: Wears seat belt.   Counseling:   Eye Exam-Last exam > 3 years ago. Appt scheduled for 11/2016. Dr. [facility]idi [facility]-Last exam 10/2016, every 6 months. Dr. Tera Materowan.   Female:   Pap-N/A Mammo-03/02/15. US right on 03/31/15, fibroadenoma. Recall 6 months. Wait with DEXA scan.      Dexa scan-03/02/2015, Osteopenia.         CCS-Cologuard ordered.       Objective:     Vitals: BP 120/70 (BP Location: Left Arm, Patient Position: Sitting, Cuff Size: Large)   Pulse 60   Resp 18   Ht 5\' 5"  (1.651 m)   Wt 172 lb 6.4 oz (78.2 kg)   SpO2 98%   BMI 28.69 kg/m   Body mass index is 28.69 kg/m.   Tobacco History  Smoking Status  . Never Smoker  Smokeless Tobacco  . Never Used     Counseling given: Not Answered   Past Medical History:  Diagnosis Date  . Anxiety   . Hypertension   . Parkinson's disease (HCC)   . Vitamin D deficiency    Past Surgical History:  Procedure Laterality Date  . REFRACTIVE SURGERY Bilateral    Family History  Problem Relation Age of Onset  . Heart attack Father   . Heart failure Mother   . Cancer Brother        brain  . Cancer Brother        brain  . Stroke Sister    History  Sexual Activity  . Sexual activity: Not on file    Outpatient Encounter Prescriptions as of 11/01/2016  Medication Sig  . ALPRAZolam (XANAX) 0.25 MG  tablet Take 1 tablet (0.25 mg total) by mouth as needed for anxiety.  . carbidopa-levodopa (SINEMET IR) 25-100 MG tablet TAKE ONE TABLET THREE TIMES A DAY  . pramipexole (MIRAPEX) 1 MG tablet TAKE 1 AND 1/2 TABLETS BY MOUTH THREE TIMES A DAY  . [DISCONTINUED] hydrochlorothiazide (MICROZIDE) 12.5 MG capsule Take 1 capsule (12.5 mg total) by mouth daily. No further refills without a Blood Pressure follow up. Please call [phone] to schedule.   No facility-administered encounter medications on file as of 11/01/2016.     Activities of Daily Living In your present state of health, do you have any difficulty performing the following activities: 11/01/2016  Hearing? N  Vision? N  Difficulty concentrating or making decisions? N  Walking or climbing stairs? N  Dressing or bathing? N  Doing errands, shopping? N  Preparing Food and eating ? N  Using the Toilet? N  In the past six months, have you accidently leaked urine? N  Do you have problems with loss of bowel control? N  Managing your Medications? N  Managing your Finances? N  Housekeeping or managing your Housekeeping? N  Some recent data might be hidden    Patient Care Team: Sheliah Hatchabori, Katherine E, MD as PCP - General Tat, Octaviano Battyebecca S, DO  as Consulting Physician (Neurology)    Assessment:    Physical assessment deferred to PCP.  Exercise Activities and Dietary recommendations Current Exercise Habits: Home exercise routine, Type of exercise: walking, Time (Minutes): 60, Frequency (Times/Week): 7, Weekly Exercise (Minutes/Week): 420, Exercise limited by: None identified Diet (meal preparation, eat out, water intake, caffeinated beverages, dairy products, fruits and vegetables): Drinks water and tea.   Breakfast: cereal; egg salad Lunch: protein and vegetable Dinner: sandwich; cereal   Goals    . Weight (lb) < 150 lb (68 kg)          Lose weight by starting spin class.       Fall Risk Fall Risk  11/01/2016 10/18/2016 07/19/2016 04/18/2016  11/16/2015  Falls in the past year? No No Yes No No  Number falls in past yr: - - 1 - -  Injury with Fall? - - No - -  Follow up - - Falls evaluation completed - -   Depression Screen PHQ 2/9 Scores 11/01/2016 08/16/2015 02/15/2015 02/09/2014  PHQ - 2 Score 0 0 1 0     Cognitive Function       Ad8 score reviewed for issues:  Issues making decisions: no  Less interest in hobbies / activities: no  Repeats questions, stories (family complaining): no  Trouble using ordinary gadgets (microwave, computer, phone): no  Forgets the month or year: no  Mismanaging finances: no  Remembering appts: no  Daily problems with thinking and/or memory: no Ad8 score is=0     Immunization History  Administered Date(s) Administered  . Influenza Whole 01/31/2007, 12/24/2008, 12/30/2009  . Influenza,inj,Quad PF,36+ Mos 02/09/2014, 02/15/2015  . Pneumococcal Conjugate-13 02/09/2014  . Pneumococcal Polysaccharide-23 02/15/2015   Screening Tests Health Maintenance  Topic Date Due  . Hepatitis C Screening  27-Jul-1945  . COLONOSCOPY  10/28/1997  . INFLUENZA VACCINE  11/01/2016  . MAMMOGRAM  02/28/2017 (Originally 03/01/2016)  . TETANUS/TDAP  11/01/2017 (Originally 10/29/1966)  . DEXA SCAN  Completed  . PNA vac Low Risk Adult  Completed   Declines Shingrix today. Cologuard ordered today. Postponing mammogram to after 03/01/2017 (to be done with DEXA) Hep C Screening today    Plan:    Bring a copy of your advance directives to your next office visit.  Continue doing brain stimulating activities (puzzles, reading, adult coloring books, staying active) to keep memory sharp.   I have personally reviewed and noted the following in the patient's chart:   . Medical and social history . Use of alcohol, tobacco or illicit drugs  . Current medications and supplements . Functional ability and status . Nutritional status . Physical activity . Advanced directives . List of other  physicians . Hospitalizations, surgeries, and ER visits in previous 12 months . Vitals . Screenings to include cognitive, depression, and falls . Referrals and appointments  In addition, I have reviewed and discussed with patient certain preventive protocols, quality metrics, and best practice recommendations. A written personalized care plan for preventive services as well as general preventive health recommendations were provided to patient.     Alysia PennaKimberly R Saksham Akkerman, RN  11/01/2016  Reviewed documentation by RN and agree w/ above.  Neena RhymesKatherine [name], MD

## 2016-11-01 NOTE — Progress Notes (Signed)
   Subjective:    Patient ID: Carly EvertsBeverly B [name], female    DOB: 08/05/1945, 71 y.o.   MRN: [id number]  HPI CPE- UTD on pneumonia vaccines.  Due for mammo and DEXA- pt has this scheduled for 11/29.  Overdue for colonoscopy- pt prefers cologuard (order placed).     Review of Systems Patient reports no vision/ hearing changes, adenopathy,fever, weight change,  persistant/recurrent hoarseness , swallowing issues, chest pain, palpitations, edema, persistant/recurrent cough, hemoptysis, dyspnea (rest/exertional/paroxysmal nocturnal), gastrointestinal bleeding (melena, rectal bleeding), abdominal pain, significant heartburn, bowel changes, GU symptoms (dysuria, hematuria, incontinence), Gyn symptoms (abnormal  bleeding, pain),  syncope, focal weakness, memory loss, numbness & tingling, skin/hair/nail changes, abnormal bruising or bleeding, anxiety, or depression.   Pt notes that her BP will intermittently drop low at home    Objective:   Physical Exam General Appearance:    Alert, cooperative, no distress, appears stated age  Head:    Normocephalic, without obvious abnormality, atraumatic  Eyes:    PERRL, conjunctiva/corneas clear, EOM's intact, fundi    benign, both eyes  Ears:    Normal TM's and external ear canals, both ears  Nose:   Nares normal, septum midline, mucosa normal, no drainage    or sinus tenderness  Throat:   Lips, mucosa, and tongue normal; teeth and gums normal  Neck:   Supple, symmetrical, trachea midline, no adenopathy;    Thyroid: no enlargement/tenderness/nodules  Back:     Symmetric, no curvature, ROM normal, no CVA tenderness  Lungs:     Clear to auscultation bilaterally, respirations unlabored  Chest Wall:    No tenderness or deformity   Heart:    Regular rate and rhythm, S1 and S2 normal, no murmur, rub   or gallop  Breast Exam:    Deferred to mammo  Abdomen:     Soft, non-tender, bowel sounds active all four quadrants,    no masses, no organomegaly  Genitalia:     Deferred  Rectal:    Extremities:   Extremities normal, atraumatic, no cyanosis or edema  Pulses:   2+ and symmetric all extremities  Skin:   Skin color, texture, turgor normal, no rashes or lesions  Lymph nodes:   Cervical, supraclavicular, and axillary nodes normal  Neurologic:   CNII-XII intact, normal strength, sensation and reflexes    throughout          Assessment & Plan:

## 2016-11-01 NOTE — Patient Instructions (Signed)
Bring a copy of your advance directives to your next office visit.  Continue doing brain stimulating activities (puzzles, reading, adult coloring books, staying active) to keep memory sharp.   Health Maintenance, Female Adopting a healthy lifestyle and getting preventive care can go a long way to promote health and wellness. Talk with your health care provider about what schedule of regular examinations is right for you. This is a good chance for you to check in with your provider about disease prevention and staying healthy. In between checkups, there are plenty of things you can do on your own. Experts have done a lot of research about which lifestyle changes and preventive measures are most likely to keep you healthy. Ask your health care provider for more information. Weight and diet Eat a healthy diet  Be sure to include plenty of vegetables, fruits, low-fat dairy products, and lean protein.  Do not eat a lot of foods high in solid fats, added sugars, or salt.  Get regular exercise. This is one of the most important things you can do for your health. ? Most adults should exercise for at least 150 minutes each week. The exercise should increase your heart rate and make you sweat (moderate-intensity exercise). ? Most adults should also do strengthening exercises at least twice a week. This is in addition to the moderate-intensity exercise.  Maintain a healthy weight  Body mass index (BMI) is a measurement that can be used to identify possible weight problems. It estimates body fat based on height and weight. Your health care provider can help determine your BMI and help you achieve or maintain a healthy weight.  For females 28 years of age and older: ? A BMI below 18.5 is considered underweight. ? A BMI of 18.5 to 24.9 is normal. ? A BMI of 25 to 29.9 is considered overweight. ? A BMI of 30 and above is considered obese.  Watch levels of cholesterol and blood lipids  You should start  having your blood tested for lipids and cholesterol at 69 years of age, then have this test every 5 years.  You may need to have your cholesterol levels checked more often if: ? Your lipid or cholesterol levels are high. ? You are older than 69 years of age. ? You are at high risk for heart disease.  Cancer screening Lung Cancer  Lung cancer screening is recommended for adults 32-59 years old who are at high risk for lung cancer because of a history of smoking.  A yearly low-dose CT scan of the lungs is recommended for people who: ? Currently smoke. ? Have quit within the past 15 years. ? Have at least a 30-pack-year history of smoking. A pack year is smoking an average of one pack of cigarettes a day for 1 year.  Yearly screening should continue until it has been 15 years since you quit.  Yearly screening should stop if you develop a health problem that would prevent you from having lung cancer treatment.  Breast Cancer  Practice breast self-awareness. This means understanding how your breasts normally appear and feel.  It also means doing regular breast self-exams. Let your health care provider know about any changes, no matter how small.  If you are in your 20s or 30s, you should have a clinical breast exam (CBE) by a health care provider every 1-3 years as part of a regular health exam.  If you are 49 or older, have a CBE every year. Also consider having a  breast X-ray (mammogram) every year.  If you have a family history of breast cancer, talk to your health care provider about genetic screening.  If you are at high risk for breast cancer, talk to your health care provider about having an MRI and a mammogram every year.  Breast cancer gene (BRCA) assessment is recommended for women who have family members with BRCA-related cancers. BRCA-related cancers include: ? Breast. ? Ovarian. ? Tubal. ? Peritoneal cancers.  Results of the assessment will determine the need for  genetic counseling and BRCA1 and BRCA2 testing.  Cervical Cancer Your health care provider may recommend that you be screened regularly for cancer of the pelvic organs (ovaries, uterus, and vagina). This screening involves a pelvic examination, including checking for microscopic changes to the surface of your cervix (Pap test). You may be encouraged to have this screening done every 3 years, beginning at age 66.  For women ages 82-65, health care providers may recommend pelvic exams and Pap testing every 3 years, or they may recommend the Pap and pelvic exam, combined with testing for human papilloma virus (HPV), every 5 years. Some types of HPV increase your risk of cervical cancer. Testing for HPV may also be done on women of any age with unclear Pap test results.  Other health care providers may not recommend any screening for nonpregnant women who are considered low risk for pelvic cancer and who do not have symptoms. Ask your health care provider if a screening pelvic exam is right for you.  If you have had past treatment for cervical cancer or a condition that could lead to cancer, you need Pap tests and screening for cancer for at least 20 years after your treatment. If Pap tests have been discontinued, your risk factors (such as having a new sexual partner) need to be reassessed to determine if screening should resume. Some women have medical problems that increase the chance of getting cervical cancer. In these cases, your health care provider may recommend more frequent screening and Pap tests.  Colorectal Cancer  This type of cancer can be detected and often prevented.  Routine colorectal cancer screening usually begins at 69 years of age and continues through 69 years of age.  Your health care provider may recommend screening at an earlier age if you have risk factors for colon cancer.  Your health care provider may also recommend using home test kits to check for hidden blood in the  stool.  A small camera at the end of a tube can be used to examine your colon directly (sigmoidoscopy or colonoscopy). This is done to check for the earliest forms of colorectal cancer.  Routine screening usually begins at age 23.  Direct examination of the colon should be repeated every 5-10 years through 69 years of age. However, you may need to be screened more often if early forms of precancerous polyps or small growths are found.  Skin Cancer  Check your skin from head to toe regularly.  Tell your health care provider about any new moles or changes in moles, especially if there is a change in a mole's shape or color.  Also tell your health care provider if you have a mole that is larger than the size of a pencil eraser.  Always use sunscreen. Apply sunscreen liberally and repeatedly throughout the day.  Protect yourself by wearing long sleeves, pants, a wide-brimmed hat, and sunglasses whenever you are outside.  Heart disease, diabetes, and high blood pressure  High  blood pressure causes heart disease and increases the risk of stroke. High blood pressure is more likely to develop in: ? People who have blood pressure in the high end of the normal range (130-139/85-89 mm Hg). ? People who are overweight or obese. ? People who are African American.  If you are 43-71 years of age, have your blood pressure checked every 3-5 years. If you are 34 years of age or older, have your blood pressure checked every year. You should have your blood pressure measured twice-once when you are at a hospital or clinic, and once when you are not at a hospital or clinic. Record the average of the two measurements. To check your blood pressure when you are not at a hospital or clinic, you can use: ? An automated blood pressure machine at a pharmacy. ? A home blood pressure monitor.  If you are between 36 years and 71 years old, ask your health care provider if you should take aspirin to prevent  strokes.  Have regular diabetes screenings. This involves taking a blood sample to check your fasting blood sugar level. ? If you are at a normal weight and have a low risk for diabetes, have this test once every three years after 70 years of age. ? If you are overweight and have a high risk for diabetes, consider being tested at a younger age or more often. Preventing infection Hepatitis B  If you have a higher risk for hepatitis B, you should be screened for this virus. You are considered at high risk for hepatitis B if: ? You were born in a country where hepatitis B is common. Ask your health care provider which countries are considered high risk. ? Your parents were born in a high-risk country, and you have not been immunized against hepatitis B (hepatitis B vaccine). ? You have HIV or AIDS. ? You use needles to inject street drugs. ? You live with someone who has hepatitis B. ? You have had sex with someone who has hepatitis B. ? You get hemodialysis treatment. ? You take certain medicines for conditions, including cancer, organ transplantation, and autoimmune conditions.  Hepatitis C  Blood testing is recommended for: ? Everyone born from [birth year] through 1963. ? Anyone with known risk factors for hepatitis C.  Sexually transmitted infections (STIs)  You should be screened for sexually transmitted infections (STIs) including gonorrhea and chlamydia if: ? You are sexually active and are younger than 69 years of age. ? You are older than 69 years of age and your health care provider tells you that you are at risk for this type of infection. ? Your sexual activity has changed since you were last screened and you are at an increased risk for chlamydia or gonorrhea. Ask your health care provider if you are at risk.  If you do not have HIV, but are at risk, it may be recommended that you take a prescription medicine daily to prevent HIV infection. This is called pre-exposure prophylaxis  (PrEP). You are considered at risk if: ? You are sexually active and do not regularly use condoms or know the HIV status of your partner(s). ? You take drugs by injection. ? You are sexually active with a partner who has HIV.  Talk with your health care provider about whether you are at high risk of being infected with HIV. If you choose to begin PrEP, you should first be tested for HIV. You should then be tested every 3 months for  as long as you are taking PrEP. Pregnancy  If you are premenopausal and you may become pregnant, ask your health care provider about preconception counseling.  If you may become pregnant, take 400 to 800 micrograms (mcg) of folic acid every day.  If you want to prevent pregnancy, talk to your health care provider about birth control (contraception). Osteoporosis and menopause  Osteoporosis is a disease in which the bones lose minerals and strength with aging. This can result in serious bone fractures. Your risk for osteoporosis can be identified using a bone density scan.  If you are 58 years of age or older, or if you are at risk for osteoporosis and fractures, ask your health care provider if you should be screened.  Ask your health care provider whether you should take a calcium or vitamin D supplement to lower your risk for osteoporosis.  Menopause may have certain physical symptoms and risks.  Hormone replacement therapy may reduce some of these symptoms and risks. Talk to your health care provider about whether hormone replacement therapy is right for you. Follow these instructions at home:  Schedule regular health, dental, and eye exams.  Stay current with your immunizations.  Do not use any tobacco products including cigarettes, chewing tobacco, or electronic cigarettes.  If you are pregnant, do not drink alcohol.  If you are breastfeeding, limit how much and how often you drink alcohol.  Limit alcohol intake to no more than 1 drink per day for  nonpregnant women. One drink equals 12 ounces of beer, 5 ounces of wine, or 1 ounces of hard liquor.  Do not use street drugs.  Do not share needles.  Ask your health care provider for help if you need support or information about quitting drugs.  Tell your health care provider if you often feel depressed.  Tell your health care provider if you have ever been abused or do not feel safe at home. This information is not intended to replace advice given to you by your health care provider. Make sure you discuss any questions you have with your health care provider. Document Released: 10/03/2010 Document Revised: 08/26/2015 Document Reviewed: 12/22/2014 Elsevier Interactive Patient Education  [employer].

## 2016-11-06 DIAGNOSIS — H52223 Regular astigmatism, bilateral: Secondary | ICD-10-CM | POA: Diagnosis not present

## 2016-11-06 DIAGNOSIS — H40003 Preglaucoma, unspecified, bilateral: Secondary | ICD-10-CM | POA: Diagnosis not present

## 2016-11-06 DIAGNOSIS — H353121 Nonexudative age-related macular degeneration, left eye, early dry stage: Secondary | ICD-10-CM | POA: Diagnosis not present

## 2016-11-06 DIAGNOSIS — H25013 Cortical age-related cataract, bilateral: Secondary | ICD-10-CM | POA: Diagnosis not present

## 2016-11-21 ENCOUNTER — Ambulatory Visit (HOSPITAL_COMMUNITY)
Admission: RE | Admit: 2016-11-21 | Discharge: 2016-11-21 | Disposition: A | Discharge status: Home / Self Care | Payer: Medicare HMO | Source: Ambulatory Visit | Attending: Family Medicine | Admitting: Family Medicine

## 2016-11-21 ENCOUNTER — Other Ambulatory Visit (HOSPITAL_COMMUNITY): Payer: Self-pay | Admitting: Orthopedic Surgery

## 2016-11-21 DIAGNOSIS — M79605 Pain in left leg: Secondary | ICD-10-CM

## 2016-11-21 DIAGNOSIS — M7989 Other specified soft tissue disorders: Principal | ICD-10-CM

## 2016-11-21 DIAGNOSIS — S83242A Other tear of medial meniscus, current injury, left knee, initial encounter: Secondary | ICD-10-CM | POA: Diagnosis not present

## 2016-11-21 NOTE — Progress Notes (Signed)
*  PRELIMINARY RESULTS* Vascular Ultrasound Left lower extremity venous duplex has been completed.  Preliminary findings: No evidence of deep vein thrombosis in the visualized veins or baker's cyst in the left lower extremity.  Preliminary results called to Dr. Thurston Hole @ 14:30.   Chauncey Fischer 11/21/2016, 3:07 PM

## 2016-12-07 DIAGNOSIS — H40003 Preglaucoma, unspecified, bilateral: Secondary | ICD-10-CM | POA: Diagnosis not present

## 2016-12-12 DIAGNOSIS — S83242D Other tear of medial meniscus, current injury, left knee, subsequent encounter: Secondary | ICD-10-CM | POA: Diagnosis not present

## 2016-12-13 ENCOUNTER — Other Ambulatory Visit: Payer: Self-pay | Admitting: Neurology

## 2016-12-13 ENCOUNTER — Telehealth: Payer: Self-pay | Admitting: Neurology

## 2016-12-13 MED ORDER — CARBIDOPA-LEVODOPA 25-100 MG PO TABS: ORAL_TABLET | ORAL | 1 refills | Status: DC

## 2016-12-13 MED ORDER — PRAMIPEXOLE DIHYDROCHLORIDE 1 MG PO TABS: ORAL_TABLET | ORAL | 2 refills | Status: DC

## 2016-12-13 NOTE — Telephone Encounter (Signed)
Called patient and informed sent Rx. Verified pharmacy on file.

## 2016-12-13 NOTE — Telephone Encounter (Signed)
Patient called needing to get a Refill on Carbidopa Levodopa and on Pramipexole. She said a letter was received from our office saying it was denied. She was wanting to try and get a refill in case the weather was bad. Please Call. Thanks

## 2016-12-19 DIAGNOSIS — M25562 Pain in left knee: Secondary | ICD-10-CM | POA: Diagnosis not present

## 2016-12-22 ENCOUNTER — Telehealth: Payer: Self-pay | Admitting: Family Medicine

## 2016-12-22 ENCOUNTER — Telehealth: Payer: Self-pay | Admitting: Neurology

## 2016-12-22 NOTE — Telephone Encounter (Signed)
Pt has been schedule for nurse visit.

## 2016-12-22 NOTE — Telephone Encounter (Signed)
Patient called needing to let Dr. Arbutus Leas know that Dr. Misty Stanley be sending over a fax regarding Carly [name]'s upcoming knee surgery. Thanks

## 2016-12-22 NOTE — Telephone Encounter (Signed)
Noted. Will keep an eye out for fax.

## 2016-12-22 NOTE — Telephone Encounter (Signed)
Patient states she will be having knee surgery. Dr. Salvatore Marvel will be sending over information related to upcoming surgery to pcp for review.  If there are any questions or concerns please contact patient at [phone] or cell [phone].

## 2016-12-22 NOTE — Telephone Encounter (Signed)
Pt will need nurse visit to get EKG and then we can clear for surgery

## 2016-12-22 NOTE — Telephone Encounter (Signed)
Pt has not had an EKG since 2011, last CPE August 2018. Pleas advise?

## 2016-12-28 DIAGNOSIS — H40003 Preglaucoma, unspecified, bilateral: Secondary | ICD-10-CM | POA: Diagnosis not present

## 2016-12-28 DIAGNOSIS — H25013 Cortical age-related cataract, bilateral: Secondary | ICD-10-CM | POA: Diagnosis not present

## 2016-12-28 DIAGNOSIS — H353121 Nonexudative age-related macular degeneration, left eye, early dry stage: Secondary | ICD-10-CM | POA: Diagnosis not present

## 2017-01-01 ENCOUNTER — Ambulatory Visit (INDEPENDENT_AMBULATORY_CARE_PROVIDER_SITE_OTHER): Payer: Medicare HMO | Admitting: Emergency Medicine

## 2017-01-01 DIAGNOSIS — Z01818 Encounter for other preprocedural examination: Secondary | ICD-10-CM

## 2017-01-01 HISTORY — PX: KNEE SURGERY: SHX244

## 2017-01-01 NOTE — Progress Notes (Signed)
Patient presents today for a nurse visit EKG. Patient needs a EKG for preo-operative surgery for her knee.

## 2017-01-12 DIAGNOSIS — M2242 Chondromalacia patellae, left knee: Secondary | ICD-10-CM | POA: Diagnosis not present

## 2017-01-12 DIAGNOSIS — G8918 Other acute postprocedural pain: Secondary | ICD-10-CM | POA: Diagnosis not present

## 2017-01-12 DIAGNOSIS — X503XXA Overexertion from repetitive movements, initial encounter: Secondary | ICD-10-CM | POA: Diagnosis not present

## 2017-01-12 DIAGNOSIS — S83282A Other tear of lateral meniscus, current injury, left knee, initial encounter: Secondary | ICD-10-CM | POA: Diagnosis not present

## 2017-01-12 DIAGNOSIS — S83242A Other tear of medial meniscus, current injury, left knee, initial encounter: Secondary | ICD-10-CM | POA: Diagnosis not present

## 2017-01-18 DIAGNOSIS — M25562 Pain in left knee: Secondary | ICD-10-CM | POA: Diagnosis not present

## 2017-02-08 ENCOUNTER — Other Ambulatory Visit (HOSPITAL_COMMUNITY): Payer: Self-pay | Admitting: Orthopedic Surgery

## 2017-02-08 ENCOUNTER — Encounter (HOSPITAL_COMMUNITY): Payer: Self-pay

## 2017-02-08 ENCOUNTER — Ambulatory Visit (HOSPITAL_COMMUNITY)
Admission: RE | Admit: 2017-02-08 | Discharge: 2017-02-08 | Disposition: A | Discharge status: Home / Self Care | Payer: Medicare HMO | Source: Ambulatory Visit | Attending: Cardiology | Admitting: Cardiology

## 2017-02-08 DIAGNOSIS — S83282D Other tear of lateral meniscus, current injury, left knee, subsequent encounter: Secondary | ICD-10-CM | POA: Diagnosis not present

## 2017-02-08 DIAGNOSIS — R609 Edema, unspecified: Secondary | ICD-10-CM

## 2017-02-08 DIAGNOSIS — S83242D Other tear of medial meniscus, current injury, left knee, subsequent encounter: Secondary | ICD-10-CM | POA: Diagnosis not present

## 2017-02-08 DIAGNOSIS — M7989 Other specified soft tissue disorders: Secondary | ICD-10-CM | POA: Insufficient documentation

## 2017-02-08 NOTE — Progress Notes (Unsigned)
Today's left lower extremity venous duplex is negative for DVT. There is a ruptured Baker's cyst. Preliminary results given to Kirstin.

## 2017-02-19 NOTE — Progress Notes (Signed)
Carly [name] was seen today in the movement disorders clinic for neurologic consultation at the request of [name], [name], MD.  The consultation is for the evaluation of PD.  Pt is a former pt of [name] and [name].  I reviewed the prior records that were available to me.  The patient began to notice right hand tremor in 2009 with associated right arm stiffness.  She was initially started on selegiline and later pramipexole, which was worked up to 1 mg 3 times a day.  They did try Mirapex ER 3 mg for a short period of time, but her insurance quit paying for that.  Pt states that she loved it when she was on it once per day but it was just too expensive.  She went back to mirapex 1 mg tid.  She has trouble remembering the middle of the day mirapex but it reminds her with tremor and stiffness.   She was on Azilect, but it was also discontinued because of cost and it didn't help.  In July, 2014 carbidopa/levodopa 50/200 was added at night for cramping and RLS.  This helped, but the patient complained of some dizziness so this was decreased to carbidopa/levodopa 25/100 CR at night.  This was discontinued in May, 2015 as the patient's cramping seemed better and RLS was better.  Pt isn't sure that she even took it that long.  Pt is not currently exercising faithfully; in the past she was a marathon runner and was very competitive with sports.  She has never been to the PT program at the [facility] ([place]ives between here and [place]).  06/08/14 update:  Pt has a hx of PD and last visit I increased her mirapex to 1.5 mg tid.  Going up on the dosage did help.   Pt has been under a lot of stress.  Her partner of 15 years found out he has prostate CA and some friends have died.  She has noted that stress increases tremor.  She moved a piece of heavy furniture after we last met and her L sciatica flared and she just got better and then she moved something again and injured her right groin.  This has  prevented exercise.  No lightheadness/near syncope.    09/08/14 update:  Pt returns for follow up.  She is on mirapex 1.5 mg tid.  She is c/o more freezing.  She states that she can get up and turn to the right but when trying to turn to the left, she has start hesitation/freezing.  No falls.  No lightheadness/near syncope.  She is walking and riding her bike some.    11/27/14 update:  Pt is on mirapex 1.5 tid and last visit I started her on carbidopa/levodopa 25/100 tid.  She is no longer having freezing spells and states that she is doing better than she expected.  She exercises some.  She is talking and dreaming more than she was; she is not falling out of bed.  States that her best friends husband just died and she thinks that caused more of the dreams.  03/16/15 update:  The patient is following up today regarding her Parkinson's disease.  She is on pramipexole, 1-1/2 mg 3 times a day in addition to carbidopa/levodopa 25/100, one tablet 3 times per day.  She is exercising and she is switching insurance and that will pay for silver sneakers so she will be able to go back to the gym.  She denies  any falls since our last visit.  No hallucinations.  Rare visual distortions.  No lightheadedness or near syncope.  She remains on oral B12 supplements for a history of B12 deficiency.  06/15/15 update:  The patient is following up today regarding her Parkinson's disease.  She is on pramipexole, 1.5 mg 3 times a day in addition to carbidopa/levodopa 25/100, one tablet 3 times per day.  States that if she is late with her carbidopa/levodopa 25/100, she can hardly move.  No falls since last visit.  No hallucinations.  No lightheadedness or syncope.  Sleeping well.  States that her significant other had prostate CA and then had MI and then had CABG so she has been exercising a little less.  She is riding her bike.  In regards to RBD, she states that she was doing good until this week and she woke up sitting up yelling  and trying to shoot something.  11/16/15 update:  The patient followed up today regarding her Parkinson's disease.  She remains on pramipexole, 1.5 mg 3 times per day and carbidopa/levodopa 25 mg 3 times per day.  States that 2nd dose seems to wear off about an hour before the 3rd dose.  Overall, doing and feeling well.  "I'm feeling good."  She saw Dr. Birdie Riddle in May, and I reviewed those records.  The patient denies any falls since last visit.  She continues to exercise.  She is riding her stationary bike.  She denies any falls since last visit.  No lightheadedness.  No syncope.  No hallucinations.  04/18/16 update:  Patient follows up today.  She remains on pramipexole 1.76m 3 times per day.  She is also on carbidopa/levodopa 25/100, one tablet 3 times per day. Notes that when medication wears off she gets very tired.  Usually isn't much before next dose.  Over the holiday, she dosed her medication later so that she could have a late dinner party.  Noted more "swaying"/dyskinesia if gets anxious or nervous.   Pt denies falls.  Pt denies lightheadedness, near syncope.  No hallucinations.  Mood has been good.  07/19/16 update:  Patient seen today in follow-up.  She remains on pramipexole, 1.5 mg 3 times per day and carbidopa/levodopa 25/100, one tablet 3 times per day.  We started amantadine last visit, 100 mg 3 times per day due to dyskinesia.  The patient states that she took it for 2 days and it made her lightheaded.  She isn't sure if it helped the dyskinesia.  She read the things I gave her on DBS and talked to others who had it.    10/18/16 update:  Patient seen today in follow-up.  She is on pramipexole, 1.5 mg 3 times per day.  She denies compulsive behaviors.  She denies hallucinations.  She denies sleep attacks.  She is also on carbidopa/levodopa 25/100, one tablet 3 times per day.  She was given gocovri last visit but she never started that because it was very expensive and her insurance wouldn't pay  any of it and it was $500.   She is off of her B12 pills too.  She is walking for exercise  02/20/17 update: Patient seen today in follow-up. The records that were made available to me were reviewed.   Patient is on pramipexole, 1.5 mg 3 times per day.  She has had no sleep attacks or compulsive behaviors.  She remains also on carbidopa/levodopa 25/100, 1 tablet 3 times per day.  Pt denies falls.  Pt denies lightheadedness, near syncope.  No hallucinations.  Mood has been good.  She had knee surgery with Dr. Noemi Chapel about 8 weeks ago on the L.  She did recently have a ruptured bakers cyst on the L.  She is having some trouble walking because of it.  She is doing some exercises given to her, but has not been to physical therapy.  PREVIOUS MEDICATIONS: Sinemet CR, Mirapex and azilect and selegeline; amantadine (lightheaded)  ALLERGIES:   Allergies  Allergen Reactions  . Codeine     REACTION: nausea    CURRENT MEDICATIONS:  Outpatient Encounter Medications as of 02/20/2017  Medication Sig  . ALPRAZolam (XANAX) 0.25 MG tablet Take 1 tablet (0.25 mg total) by mouth as needed for anxiety.  . carbidopa-levodopa (SINEMET IR) 25-100 MG tablet TAKE ONE TABLET THREE TIMES A DAY  . pramipexole (MIRAPEX) 1 MG tablet TAKE 1 AND 1/2 TABLETS BY MOUTH THREE TIMES A DAY  . vitamin B-12 (CYANOCOBALAMIN) 1000 MCG tablet Take 1,000 mcg by mouth daily.   No facility-administered encounter medications on file as of 02/20/2017.     PAST MEDICAL HISTORY:   Past Medical History:  Diagnosis Date  . Anxiety   . Hypertension   . Parkinson's disease ([place])   . Vitamin D deficiency     PAST SURGICAL HISTORY:   Past Surgical History:  Procedure Laterality Date  . KNEE SURGERY Left 01/2017  . REFRACTIVE SURGERY Bilateral     SOCIAL HISTORY:   Social History   Socioeconomic History  . Marital status: Married    Spouse name: Not on file  . Number of children: 1  . Years of education: Not on file  .  Highest education level: Not on file  Social Needs  . Financial resource strain: Not on file  . Food insecurity - worry: Not on file  . Food insecurity - inability: Not on file  . Transportation needs - medical: Not on file  . Transportation needs - non-medical: Not on file  Occupational History  . Occupation: unempolyed   Tobacco Use  . Smoking status: Never Smoker  . Smokeless tobacco: Never Used  Substance and Sexual Activity  . Alcohol use: No  . Drug use: No  . Sexual activity: Not on file  Other Topics Concern  . Not on file  Social History Narrative   Patient lives at home with partner [name].    Patient has one adult child.    Patient has 14 years of education.    Patient does not work.   Caffeine consumption is 1 cup daily     FAMILY HISTORY:   Family Status  Relation Name Status  . Father  Deceased at age 35       heart attack, smoker  . Mother  Deceased at age 69       heart   . Brother  Deceased       brain tumor  . Brother  Deceased       bladder cancer, smoker, ETOH  . Sister Hoyle Sauer Alive       stroke  . Sister Hayden Pedro       healthy  . Son  Alive       arthritis     ROS:  A complete 10 system review of systems was obtained and was unremarkable apart from what is mentioned above.  PHYSICAL EXAMINATION:    VITALS:   Vitals:   02/20/17 1055  BP: 112/74  Pulse: 62  SpO2: 96%  Weight: 171 lb (77.6 kg)  Height: '5\' 5"'$  (1.651 m)   Wt Readings from Last 3 Encounters:  02/20/17 171 lb (77.6 kg)  11/01/16 172 lb 4 oz (78.1 kg)  11/01/16 172 lb 6.4 oz (78.2 kg)     GEN:  The patient appears stated age and is in NAD. HEENT:  Normocephalic, atraumatic.  The mucous membranes are moist. The superficial temporal arteries are without ropiness or tenderness. CV:  Bradycardic.  regular Lungs:  CTAB Neck/HEME:  There are no carotid bruits bilaterally.  Neurological examination:  Orientation: The patient is alert and oriented x3. Fund of  knowledge is appropriate.  Cranial nerves: There is good facial symmetry. There is mild facial hypomimia.  There are no square wave jerks.  The visual fields are full to confrontational testing. The speech is fluent and clear. Soft palate rises symmetrically and there is no tongue deviation. Hearing is intact to conversational tone. Sensation: Sensation is intact to light touch throughout. Motor: Strength is 5/5 in the bilateral upper and lower extremities.   Shoulder shrug is equal and symmetric.  There is no pronator drift.   Movement examination: Tone: There is normal tone in the bilateral UE.  She has some increased tone in the left lower extremity. Abnormal movements: There is no resting tremor today at all; There is no dyskinesia Coordination:  There is good RAM's today, with the exception of foot taps on the left, which may be slow due to pain. Gait and Station: The patient has no difficulty getting out of the chair without the use of the hands.  Gait was antalgic due to recent knee surgery.  Arm swing is good.  Labs  Lab Results  Component Value Date   [id number] 511 02/09/2014     ASSESSMENT/PLAN:  1.  Idiopathic Parkinson's disease, diagnosed in 2009.  She is experiencing motor fluctuations including dyskinesia and wearing off  -She will remain on Mirapex, 1.5 mg 3 times per day.  No compulsive behaviors.    -continue on the carbidopa/levodopa 25/100 tid.     -failed amantadine but state that her insurance company wouldn't pay for gocovri.  She states that she only has lots of dyskinesia with anxiety  -talked about DBS given having motor fluctuations.  She wants to hold on that for right now. 2.  REM behavior disorder.  -Overall mild.  Just consists of occasional yelling out at night.  She used to be on clonazepam but is no longer.  We'll just keep an eye on this.  Doesn't want to add medication 3.  History of B12 deficiency and peripheral neuropathy, per Dr. love/[place]  records.  -Did not see significant evidence of peripheral neuropathy on examination today, but this certainly could contribute to balance issues.  She really has no significant balance problems yet, however.  She is off of injections.  Needs to get back on her oral b12. 4.  Left knee pain, status post knee surgery and recently ruptured left Baker's cyst  -I do think some physical therapy could be of benefit.  She thinks she is interested in this as well.  She is to call me after Thanksgiving and let me know if she would like [place] to send a referral. 5.  I will see her back in the next 4 months, sooner should new neurologic issues arise.  Greater than 50% of the 25 min visit was spent in counseling with the patient discussing importance of getting back to exercise, as  she is able.  Also discussed surgical interventions.

## 2017-02-20 ENCOUNTER — Ambulatory Visit: Payer: Medicare HMO | Admitting: Neurology

## 2017-02-20 ENCOUNTER — Encounter: Payer: Self-pay | Admitting: Neurology

## 2017-02-20 VITALS — BP 112/74 | HR 62 | Ht 65.0 in | Wt 171.0 lb

## 2017-02-20 DIAGNOSIS — G20 Parkinson's disease: Secondary | ICD-10-CM

## 2017-02-20 DIAGNOSIS — M7122 Synovial cyst of popliteal space [Baker], left knee: Secondary | ICD-10-CM

## 2017-02-20 MED ORDER — PRAMIPEXOLE DIHYDROCHLORIDE 1 MG PO TABS: ORAL_TABLET | ORAL | 3 refills | Status: DC

## 2017-02-26 ENCOUNTER — Telehealth: Payer: Self-pay | Admitting: Neurology

## 2017-02-26 DIAGNOSIS — G20 Parkinson's disease: Secondary | ICD-10-CM

## 2017-02-26 DIAGNOSIS — M25569 Pain in unspecified knee: Secondary | ICD-10-CM

## 2017-02-26 DIAGNOSIS — G8929 Other chronic pain: Secondary | ICD-10-CM

## 2017-02-26 NOTE — Telephone Encounter (Signed)
Patient needs to talk to someone about getting therapy please call

## 2017-02-26 NOTE — Telephone Encounter (Signed)
Patient ready for physical therapy. [facility].

## 2017-03-05 DIAGNOSIS — H25013 Cortical age-related cataract, bilateral: Secondary | ICD-10-CM | POA: Diagnosis not present

## 2017-03-05 DIAGNOSIS — H353121 Nonexudative age-related macular degeneration, left eye, early dry stage: Secondary | ICD-10-CM | POA: Diagnosis not present

## 2017-03-05 DIAGNOSIS — H401131 Primary open-angle glaucoma, bilateral, mild stage: Secondary | ICD-10-CM | POA: Diagnosis not present

## 2017-03-06 DIAGNOSIS — S83282D Other tear of lateral meniscus, current injury, left knee, subsequent encounter: Secondary | ICD-10-CM | POA: Diagnosis not present

## 2017-03-06 DIAGNOSIS — S83242D Other tear of medial meniscus, current injury, left knee, subsequent encounter: Secondary | ICD-10-CM | POA: Diagnosis not present

## 2017-03-09 ENCOUNTER — Ambulatory Visit: Payer: Medicare HMO | Admitting: Physical Therapy

## 2017-04-05 DIAGNOSIS — S83282D Other tear of lateral meniscus, current injury, left knee, subsequent encounter: Secondary | ICD-10-CM | POA: Diagnosis not present

## 2017-04-05 DIAGNOSIS — S83242D Other tear of medial meniscus, current injury, left knee, subsequent encounter: Secondary | ICD-10-CM | POA: Diagnosis not present

## 2017-04-09 NOTE — Progress Notes (Signed)
Carly [name] was seen today in the movement disorders clinic for neurologic consultation at the request of [name], [name], MD.  The consultation is for the evaluation of PD.  Pt is a former pt of Carly [name] and Carly [name].  I reviewed the prior records that were available to me.  The patient began to notice right hand tremor in 2009 with associated right arm stiffness.  She was initially started on selegiline and later pramipexole, which was worked up to 1 mg 3 times a day.  They did try Mirapex ER 3 mg for a short period of time, but her insurance quit paying for that.  Pt states that she loved it when she was on it once per day but it was just too expensive.  She went back to mirapex 1 mg tid.  She has trouble remembering the middle of the day mirapex but it reminds her with tremor and stiffness.   She was on Azilect, but it was also discontinued because of cost and it didn't help.  In July, 2014 carbidopa/levodopa 50/200 was added at night for cramping and RLS.  This helped, but the patient complained of some dizziness so this was decreased to carbidopa/levodopa 25/100 CR at night.  This was discontinued in May, 2015 as the patient's cramping seemed better and RLS was better.  Pt isn't sure that she even took it that long.  Pt is not currently exercising faithfully; in the past she was a marathon runner and was very competitive with sports.  She has never been to the PT program at the neuro[facility]hab center ([place]ives between here and [place]).  06/08/14 update:  Pt has a hx of PD and last visit I increased her mirapex to 1.5 mg tid.  Going up on the dosage did help.   Pt has been under a lot of stress.  Her partner of 15 years found out he has prostate CA and some friends have died.  She has noted that stress increases tremor.  She moved a piece of heavy furniture after we last met and her L sciatica flared and she just got better and then she moved something again and injured her right groin.  This has  prevented exercise.  No lightheadness/near syncope.    09/08/14 update:  Pt returns for follow up.  She is on mirapex 1.5 mg tid.  She is c/o more freezing.  She states that she can get up and turn to the right but when trying to turn to the left, she has start hesitation/freezing.  No falls.  No lightheadness/near syncope.  She is walking and riding her bike some.    11/27/14 update:  Pt is on mirapex 1.5 tid and last visit I started her on carbidopa/levodopa 25/100 tid.  She is no longer having freezing spells and states that she is doing better than she expected.  She exercises some.  She is talking and dreaming more than she was; she is not falling out of bed.  States that her best friends husband just died and she thinks that caused more of the dreams.  03/16/15 update:  The patient is following up today regarding her Parkinson's disease.  She is on pramipexole, 1-1/2 mg 3 times a day in addition to carbidopa/levodopa 25/100, one tablet 3 times per day.  She is exercising and she is switching insurance and that will pay for silver sneakers so she will be able to go back to the gym.  She denies  any falls since our last visit.  No hallucinations.  Rare visual distortions.  No lightheadedness or near syncope.  She remains on oral B12 supplements for a history of B12 deficiency.  06/15/15 update:  The patient is following up today regarding her Parkinson's disease.  She is on pramipexole, 1.5 mg 3 times a day in addition to carbidopa/levodopa 25/100, one tablet 3 times per day.  States that if she is late with her carbidopa/levodopa 25/100, she can hardly move.  No falls since last visit.  No hallucinations.  No lightheadedness or syncope.  Sleeping well.  States that her significant other had prostate CA and then had MI and then had CABG so she has been exercising a little less.  She is riding her bike.  In regards to RBD, she states that she was doing good until this week and she woke up sitting up yelling  and trying to shoot something.  11/16/15 update:  The patient followed up today regarding her Parkinson's disease.  She remains on pramipexole, 1.5 mg 3 times per day and carbidopa/levodopa 25 mg 3 times per day.  States that 2nd dose seems to wear off about an hour before the 3rd dose.  Overall, doing and feeling well.  "I'm feeling good."  She saw Dr. Birdie Riddle in May, and I reviewed those records.  The patient denies any falls since last visit.  She continues to exercise.  She is riding her stationary bike.  She denies any falls since last visit.  No lightheadedness.  No syncope.  No hallucinations.  04/18/16 update:  Patient follows up today.  She remains on pramipexole 1.76m 3 times per day.  She is also on carbidopa/levodopa 25/100, one tablet 3 times per day. Notes that when medication wears off she gets very tired.  Usually isn't much before next dose.  Over the holiday, she dosed her medication later so that she could have a late dinner party.  Noted more "swaying"/dyskinesia if gets anxious or nervous.   Pt denies falls.  Pt denies lightheadedness, near syncope.  No hallucinations.  Mood has been good.  07/19/16 update:  Patient seen today in follow-up.  She remains on pramipexole, 1.5 mg 3 times per day and carbidopa/levodopa 25/100, one tablet 3 times per day.  We started amantadine last visit, 100 mg 3 times per day due to dyskinesia.  The patient states that she took it for 2 days and it made her lightheaded.  She isn't sure if it helped the dyskinesia.  She read the things I gave her on DBS and talked to others who had it.    10/18/16 update:  Patient seen today in follow-up.  She is on pramipexole, 1.5 mg 3 times per day.  She denies compulsive behaviors.  She denies hallucinations.  She denies sleep attacks.  She is also on carbidopa/levodopa 25/100, one tablet 3 times per day.  She was given gocovri last visit but she never started that because it was very expensive and her insurance wouldn't pay  any of it and it was $500.   She is off of her B12 pills too.  She is walking for exercise  02/20/17 update: Patient seen today in follow-up. The records that were made available to me were reviewed.   Patient is on pramipexole, 1.5 mg 3 times per day.  She has had no sleep attacks or compulsive behaviors.  She remains also on carbidopa/levodopa 25/100, 1 tablet 3 times per day.  Pt denies falls.  Pt denies lightheadedness, near syncope.  No hallucinations.  Mood has been good.  She had knee surgery with Dr. Noemi Chapel about 8 weeks ago on the L.  She did recently have a ruptured bakers cyst on the L.  She is having some trouble walking because of it.  She is doing some exercises given to her, but has not been to physical therapy.  04/10/17 update: Patient seen today in follow-up for Parkinson's disease.  She is seen earlier than expected.  She reports that she is likely going to have knee replacement surgery.  She was referred by Dr. Noemi Chapel to another surgeon in his group.  She is having constant pain in the knee.  She is having start hesitation in the AM.   She is still on carbidopa/levodopa 25/100, 1 tablet 3 times per day.  She is also on pramipexole 1.5 mg 3 times per day.  She has had no compulsive behaviors.  She has had more dyskinesia because of anxiety.  She has had no falls, but walking has been difficult because of the knee.  She denies lightheadedness or near syncope.  No hallucinations.  Mood has been fair but she is so worried/anxious about this knee surgery.  She is not sleeping at night but that is primarily a pain issue.  PREVIOUS MEDICATIONS: Sinemet CR, Mirapex and azilect and selegeline; amantadine (lightheaded)  ALLERGIES:   Allergies  Allergen Reactions  . Codeine     REACTION: nausea    CURRENT MEDICATIONS:  Outpatient Encounter Medications as of 04/10/2017  Medication Sig  . carbidopa-levodopa (SINEMET IR) 25-100 MG tablet TAKE ONE TABLET THREE TIMES A DAY  . latanoprost (XALATAN)  0.005 % ophthalmic solution   . pramipexole (MIRAPEX) 1 MG tablet TAKE 1 AND 1/2 TABLETS BY MOUTH THREE TIMES A DAY  . vitamin B-12 (CYANOCOBALAMIN) 1000 MCG tablet Take 1,000 mcg by mouth daily.  . [DISCONTINUED] ALPRAZolam (XANAX) 0.25 MG tablet Take 1 tablet (0.25 mg total) by mouth as needed for anxiety.   No facility-administered encounter medications on file as of 04/10/2017.     PAST MEDICAL HISTORY:   Past Medical History:  Diagnosis Date  . Anxiety   . Hypertension   . Parkinson's disease ([place])   . Vitamin D deficiency     PAST SURGICAL HISTORY:   Past Surgical History:  Procedure Laterality Date  . KNEE SURGERY Left 01/2017  . REFRACTIVE SURGERY Bilateral     SOCIAL HISTORY:   Social History   Socioeconomic History  . Marital status: Married    Spouse name: Not on file  . Number of children: 1  . Years of education: Not on file  . Highest education level: Not on file  Social Needs  . Financial resource strain: Not on file  . Food insecurity - worry: Not on file  . Food insecurity - inability: Not on file  . Transportation needs - medical: Not on file  . Transportation needs - non-medical: Not on file  Occupational History  . Occupation: unempolyed   Tobacco Use  . Smoking status: Never Smoker  . Smokeless tobacco: Never Used  Substance and Sexual Activity  . Alcohol use: No  . Drug use: No  . Sexual activity: Not on file  Other Topics Concern  . Not on file  Social History Narrative   Patient lives at home with partner [name].    Patient has one adult child.    Patient has 14 years of education.  Patient does not work.   Caffeine consumption is 1 cup daily     FAMILY HISTORY:   Family Status  Relation Name Status  . Father  Deceased at age 38       heart attack, smoker  . Mother  Deceased at age 29       heart   . Brother  Deceased       brain tumor  . Brother  Deceased       bladder cancer, smoker, ETOH  . Sister Hoyle Sauer Alive        stroke  . Sister Hayden Pedro       healthy  . Son  Alive       arthritis     ROS:  A complete 10 system review of systems was obtained and was unremarkable apart from what is mentioned above.  PHYSICAL EXAMINATION:    VITALS:   Vitals:   04/10/17 1052  BP: 140/90  Pulse: 66  SpO2: 99%  Weight: 171 lb (77.6 kg)  Height: _0  (1.651 m)   Wt Readings from Last 3 Encounters:  04/10/17 171 lb (77.6 kg)  02/20/17 171 lb (77.6 kg)  11/01/16 172 lb 4 oz (78.1 kg)     GEN:  The patient appears stated age and is in NAD. HEENT:  Normocephalic, atraumatic.  The mucous membranes are moist. The superficial temporal arteries are without ropiness or tenderness. CV:  Bradycardic.  regular Lungs:  CTAB Neck/HEME:  There are no carotid bruits bilaterally.  Neurological examination:  Orientation: The patient is alert and oriented x3. Fund of knowledge is appropriate.  Cranial nerves: There is good facial symmetry. There is mild facial hypomimia.  There are no square wave jerks.  The visual fields are full to confrontational testing. The speech is fluent and clear. Soft palate rises symmetrically and there is no tongue deviation. Hearing is intact to conversational tone. Sensation: Sensation is intact to light touch throughout. Motor: Strength is 5/5 in the bilateral upper and lower extremities.   Shoulder shrug is equal and symmetric.  There is no pronator drift.   Movement examination: Tone: There is mild increased tone in the LUE Abnormal movements: There is no resting tremor today at all; There is dyskinesia, R more than L sided but it is diffuse but mild Coordination:  Mild trouble with finger taps bilaterally and toe taps bilaterally Gait and Station: The patient is using a walker today.  She walks slow and is in pain due to knee pain on the L  Labs  Lab Results  Component Value Date   VITAMINB12 831 02/09/2014     ASSESSMENT/PLAN:  1.  Idiopathic Parkinson's disease,  diagnosed in 2009.  She is experiencing motor fluctuations including dyskinesia and wearing off  -She will remain on Mirapex, 1.5 mg 3 times per day.  No compulsive behaviors.    -continue on the carbidopa/levodopa 25/100 tid.     -Patient preparing for possible knee replacement surgery.  We discussed the fact of general anesthesia on Parkinson's disease, but also discussed that often times surgery can be done under epidural.  She is going to discuss with her new surgeon at consultation.  We discussed to avoid using Phenergan if at all possible post surgery.  -failed amantadine but state that her insurance company wouldn't pay for gocovri.  She states that she only has lots of dyskinesia with anxiety  -talked about DBS given having motor fluctuations.  She wants to hold on that  for right now. 2.  REM behavior disorder.  -Overall mild.  Just consists of occasional yelling out at night.  She used to be on clonazepam but is no longer.  We'll just keep an eye on this.  Doesn't want to add medication 3.  History of B12 deficiency and peripheral neuropathy, per Dr. love/[facility] records.  -Did not see significant evidence of peripheral neuropathy on examination today, but this certainly could contribute to balance issues.  She really has no significant balance problems yet, however.  She is off of injections.  Needs to get back on her oral b12. 4.  Generalized anxiety/depression  -I think mostly related to situation with knees.  Discussed remeron and she would like to try.  She is hoping will help with sleep, although I told her that I am not sure it will since waking with pain.  Will try.  Risks, benefits, side effects and alternative therapies were discussed.  The opportunity to ask questions was given and they were answered to the best of my ability.  The patient expressed understanding and willingness to follow the outlined treatment protocols. 5.  Follow up is anticipated in the next few months, sooner  should new neurologic issues arise.  Much greater than 50% of this visit was spent in counseling and coordinating care.  Total face to face time:  25 min

## 2017-04-10 ENCOUNTER — Ambulatory Visit: Payer: Medicare HMO | Admitting: Neurology

## 2017-04-10 ENCOUNTER — Encounter: Payer: Self-pay | Admitting: Neurology

## 2017-04-10 VITALS — BP 140/90 | HR 66 | Ht 65.0 in | Wt 171.0 lb

## 2017-04-10 DIAGNOSIS — G20 Parkinson's disease: Secondary | ICD-10-CM

## 2017-04-10 DIAGNOSIS — R69 Illness, unspecified: Secondary | ICD-10-CM | POA: Diagnosis not present

## 2017-04-10 DIAGNOSIS — F411 Generalized anxiety disorder: Secondary | ICD-10-CM | POA: Diagnosis not present

## 2017-04-10 MED ORDER — MIRTAZAPINE 15 MG PO TABS: 15.0000 mg | ORAL_TABLET | Freq: Every day | ORAL | 4 refills | Status: DC

## 2017-04-12 DIAGNOSIS — M1712 Unilateral primary osteoarthritis, left knee: Secondary | ICD-10-CM | POA: Diagnosis not present

## 2017-05-11 ENCOUNTER — Other Ambulatory Visit: Payer: Self-pay | Admitting: Orthopedic Surgery

## 2017-05-14 ENCOUNTER — Other Ambulatory Visit: Payer: Self-pay | Admitting: Orthopedic Surgery

## 2017-05-14 NOTE — Pre-Procedure Instructions (Signed)
Carly [name]  05/14/2017      [name] [address] - [name], [place] - [address] [place] [address] Phone: [phone] Fax: [phone]    Your procedure is scheduled on  Monday 05/21/17  [name] Admitting at 730 A.M.  Call this number if you have problems the morning of surgery:  [phone]   Remember:  Do not eat food or drink liquids after midnight.  Take these medicines the morning of surgery with A SIP OF WATER  - CARBIDOPA-LEVODOPA, EYE DROPS, MIRAPEX   7 days prior to surgery STOP taking any Aspirin(unless otherwise instructed by your surgeon), Aleve, Naproxen, Ibuprofen, Motrin, Advil, Goody's, BC's, all herbal medications, fish oil, and all vitamins    Do not wear jewelry, make-up or nail polish.  Do not wear lotions, powders, or perfumes, or deodorant.  Do not shave 48 hours prior to surgery.  Men may shave face and neck.  Do not bring valuables to the hospital.  [facility] is not [facility] for any belongings or valuables.  Contacts, dentures or bridgework may not be worn into surgery.  Leave your suitcase in the car.  After surgery it may be brought to your room.  For patients admitted to the hospital, discharge time will be determined by your treatment team.  Patients discharged the day of surgery will not be allowed to drive home.   Name and phone number of your driver:    Special instructions:  Carly [name] - Preparing for Surgery  Before surgery, you can play an important role.  Because skin is not sterile, your skin needs to be as free of germs as possible.  You can reduce the number of germs on you skin by washing with CHG (chlorahexidine gluconate) soap before surgery.  CHG is an antiseptic cleaner which kills germs and bonds with the skin to continue killing germs even after washing.  Please DO NOT use if you have an allergy to CHG or antibacterial soaps.  If your skin becomes  reddened/irritated stop using the CHG and inform your nurse when you arrive at Short Stay.  Do not shave (including legs and underarms) for at least 48 hours prior to the first CHG shower.  You may shave your face.  Please follow these instructions carefully:   1.  Shower with CHG Soap the night before surgery and the                                morning of Surgery.  2.  If you choose to wash your hair, wash your hair first as usual with your       normal shampoo.  3.  After you shampoo, rinse your hair and body thoroughly to remove the                      Shampoo.  4.  Use CHG as you would any other liquid soap.  You can apply chg directly       to the skin and wash gently with scrungie or a clean washcloth.  5.  Apply the CHG Soap to your body ONLY FROM THE NECK DOWN.        Do not use on open wounds or open sores.  Avoid contact with your eyes,       ears, mouth and genitals (private  parts).  Wash genitals (private parts)       with your normal soap.  6.  Wash thoroughly, paying special attention to the area where your surgery        will be performed.  7.  Thoroughly rinse your body with warm water from the neck down.  8.  DO NOT shower/wash with your normal soap after using and rinsing off       the CHG Soap.  9.  Pat yourself dry with a clean towel.            10.  Wear clean pajamas.            11.  Place clean sheets on your bed the night of your first shower and do not        sleep with pets.  Day of Surgery  Do not apply any lotions/deoderants the morning of surgery.  Please wear clean clothes to the hospital/surgery center.    Please read over the following fact sheets that you were given. Pain Booklet, MRSA Information and Surgical Site Infection Prevention

## 2017-05-15 ENCOUNTER — Encounter (HOSPITAL_COMMUNITY)
Admission: RE | Admit: 2017-05-15 | Discharge: 2017-05-15 | Disposition: A | Discharge status: Home / Self Care | Payer: Medicare HMO | Source: Ambulatory Visit | Attending: Orthopedic Surgery | Admitting: Orthopedic Surgery

## 2017-05-15 ENCOUNTER — Other Ambulatory Visit: Payer: Self-pay

## 2017-05-15 ENCOUNTER — Encounter (HOSPITAL_COMMUNITY): Payer: Self-pay

## 2017-05-15 DIAGNOSIS — Z01818 Encounter for other preprocedural examination: Secondary | ICD-10-CM | POA: Insufficient documentation

## 2017-05-15 DIAGNOSIS — Z8679 Personal history of other diseases of the circulatory system: Secondary | ICD-10-CM | POA: Diagnosis not present

## 2017-05-15 HISTORY — DX: Anemia, unspecified: D64.9

## 2017-05-15 HISTORY — DX: Acute embolism and thrombosis of unspecified deep veins of lower extremity, bilateral: I82.403

## 2017-05-15 HISTORY — DX: Unspecified osteoarthritis, unspecified site: M19.90

## 2017-05-15 LAB — URINALYSIS, ROUTINE W REFLEX MICROSCOPIC
Bilirubin Urine: NEGATIVE
Glucose, UA: NEGATIVE mg/dL
KETONES UR: NEGATIVE mg/dL
NITRITE: NEGATIVE
PH: 5 (ref 5.0–8.0)
PROTEIN: NEGATIVE mg/dL
Specific Gravity, Urine: 1.012 (ref 1.005–1.030)

## 2017-05-15 LAB — TYPE AND SCREEN
ABO/RH(D): A POS
ANTIBODY SCREEN: NEGATIVE

## 2017-05-15 LAB — BASIC METABOLIC PANEL
ANION GAP: 10 (ref 5–15)
BUN: 20 mg/dL (ref 6–20)
CHLORIDE: 102 mmol/L (ref 101–111)
CO2: 24 mmol/L (ref 22–32)
Calcium: 9.2 mg/dL (ref 8.9–10.3)
Creatinine, Ser: 1.23 mg/dL — ABNORMAL HIGH (ref 0.44–1.00)
GFR, EST AFRICAN AMERICAN: 51 mL/min — AB (ref >60)
GFR, EST NON AFRICAN AMERICAN: 44 mL/min — AB (ref >60)
Glucose, Bld: 114 mg/dL — ABNORMAL HIGH (ref 65–99)
Potassium: 4 mmol/L (ref 3.5–5.1)
SODIUM: 136 mmol/L (ref 135–145)

## 2017-05-15 LAB — SURGICAL PCR SCREEN
MRSA, PCR: NEGATIVE
STAPHYLOCOCCUS AUREUS: POSITIVE — AB

## 2017-05-15 LAB — CBC WITH DIFFERENTIAL/PLATELET
BASOS ABS: 0.1 10*3/uL (ref 0.0–0.1)
Basophils Relative: 1 %
EOS PCT: 1 %
Eosinophils Absolute: 0.1 10*3/uL (ref 0.0–0.7)
HEMATOCRIT: 37.6 % (ref 36.0–46.0)
HEMOGLOBIN: 12.3 g/dL (ref 12.0–15.0)
LYMPHS ABS: 2.1 10*3/uL (ref 0.7–4.0)
Lymphocytes Relative: 23 %
MCH: 29.8 pg (ref 26.0–34.0)
MCHC: 32.7 g/dL (ref 30.0–36.0)
MCV: 91 fL (ref 78.0–100.0)
Monocytes Absolute: 0.3 10*3/uL (ref 0.1–1.0)
Monocytes Relative: 4 %
NEUTROS ABS: 6.5 10*3/uL (ref 1.7–7.7)
NEUTROS PCT: 71 %
PLATELETS: 174 10*3/uL (ref 150–400)
RBC: 4.13 MIL/uL (ref 3.87–5.11)
RDW: 12.4 % (ref 11.5–15.5)
WBC: 9.1 10*3/uL (ref 4.0–10.5)

## 2017-05-15 LAB — APTT: APTT: 28 s (ref 24–36)

## 2017-05-15 LAB — PROTIME-INR
INR: 1.11
Prothrombin Time: 14.2 seconds (ref 11.4–15.2)

## 2017-05-15 LAB — ABO/RH: ABO/RH(D): A POS

## 2017-05-15 NOTE — Progress Notes (Signed)
NOTIFIED KATHY BLOOM OF ABNORMAL UA RESULT.

## 2017-05-16 ENCOUNTER — Other Ambulatory Visit (HOSPITAL_COMMUNITY): Payer: Medicare HMO

## 2017-05-18 DIAGNOSIS — M1712 Unilateral primary osteoarthritis, left knee: Secondary | ICD-10-CM | POA: Diagnosis present

## 2017-05-18 HISTORY — DX: Unilateral primary osteoarthritis, left knee: M17.12

## 2017-05-18 MED ORDER — TRANEXAMIC ACID 1000 MG/10ML IV SOLN
2000.0000 mg | INTRAVENOUS | Status: AC
  Administered 2017-05-21: 2000 mg via TOPICAL
  Filled 2017-05-18: qty 20

## 2017-05-18 NOTE — H&P (Signed)
TOTAL KNEE ADMISSION H&P  Patient is being admitted for left total knee arthroplasty.  Subjective:  Chief Complaint:left knee pain.  HPI: Carly EvertsBeverly B [name], 72 y.o. female, has a history of pain and functional disability in the left knee due to arthritis and has failed non-surgical conservative treatments for greater than 12 weeks to includeNSAID's and/or analgesics, use of assistive devices and activity modification.  Onset of symptoms was abrupt, starting 1 years ago with rapidlly worsening course since that time. The patient noted prior procedures on the knee to include  arthroscopy on the left knee(s).  Patient currently rates pain in the left knee(s) at 10 out of 10 with activity. Patient has night pain, worsening of pain with activity and weight bearing, pain that interferes with activities of daily living and crepitus.  Patient has evidence of joint space narrowing and medial tibial plateau fracture by imaging studies.  There is no active infection.  Patient Active Problem List   Diagnosis Date Noted  . Anterior cervical lymphadenopathy 05/14/2015  . Obesity (BMI 30.0-34.9) 02/15/2015  . Osteopenia 02/15/2015  . Physical exam 02/09/2014  . B12 deficiency 01/01/2014  . Bronchitis 04/12/2011  . ACHILLES TENDINITIS 01/31/2010  . PARKINSON'S DISEASE 07/08/2008  . TREMOR, RIGHT HAND 04/07/2008  . Anxiety state 01/31/2007  . HTN (hypertension) 01/31/2007   Past Medical History:  Diagnosis Date  . Anemia   . Anxiety   . Arthritis   . DVT of lower extremity, bilateral ([facility])    years ago   . Hypertension   . Parkinson's disease ([facility])   . Vitamin D deficiency     Past Surgical History:  Procedure Laterality Date  . DILATION AND CURETTAGE OF UTERUS    . KNEE SURGERY Left 01/2017  . REFRACTIVE SURGERY Bilateral     Current Facility-Administered Medications  Medication Dose Route Frequency Provider Last Rate Last Dose  . [START ON 05/21/2017] tranexamic acid (CYKLOKAPRON) 2,000 mg  in sodium chloride 0.9 % 50 mL Topical Application  2,000 mg Topical To OR Gean Birchwoodowan, Frank, MD       Current Outpatient Medications  Medication Sig Dispense Refill Last Dose  . carbidopa-levodopa (SINEMET IR) 25-100 MG tablet TAKE ONE TABLET THREE TIMES A DAY 270 tablet 1 Taking  . ibuprofen (ADVIL,MOTRIN) 200 MG tablet Take 200 mg by mouth daily.     Marland Kitchen. latanoprost (XALATAN) 0.005 % ophthalmic solution Place 1 drop into both eyes at bedtime.    Taking  . mirtazapine (REMERON) 15 MG tablet Take 1 tablet (15 mg total) by mouth at bedtime. 30 tablet 4   . pramipexole (MIRAPEX) 1 MG tablet TAKE 1 AND 1/2 TABLETS BY MOUTH THREE TIMES A DAY (Patient taking differently: Take 1.5 mg by mouth 3 (three) times daily. ) 135 tablet 3 Taking   Allergies  Allergen Reactions  . Codeine Nausea Only and Other (See Comments)    hallucinations    Social History   Tobacco Use  . Smoking status: Never Smoker  . Smokeless tobacco: Never Used  Substance Use Topics  . Alcohol use: No    Family History  Problem Relation Age of Onset  . Heart attack Father   . Heart failure Mother   . Cancer Brother        brain  . Cancer Brother        brain  . Stroke Sister      Review of Systems  Constitutional: Positive for diaphoresis and malaise/fatigue.  HENT: Positive for hearing loss.  Eyes: Positive for blurred vision.  Respiratory: Negative.   Cardiovascular: Positive for leg swelling.       Htn  Gastrointestinal: Positive for constipation.  Genitourinary: Positive for urgency.  Musculoskeletal: Positive for joint pain and myalgias.  Skin: Positive for rash.  Neurological: Positive for tremors and focal weakness.  Psychiatric/Behavioral: Positive for depression and memory loss. The patient is nervous/anxious.     Objective:  Physical Exam  Constitutional: She is oriented to person, place, and time. She appears well-developed and well-nourished.  HENT:  Head: Normocephalic and atraumatic.  Eyes:  Pupils are equal, round, and reactive to light.  Neck: Normal range of motion. Neck supple.  Cardiovascular: Intact distal pulses.  Respiratory: Effort normal.  Musculoskeletal:  the patient has good strength and a range from 0-110.  No instability.  She does have tenderness over the medial joint line.  She does have mild fullness in the left knee.  Right knee has good strength good range of motion and no pain.  Neurological: She is alert and oriented to person, place, and time.  Skin: Skin is warm and dry.  Psychiatric: She has a normal mood and affect. Her behavior is normal. Judgment and thought content normal.    Vital signs in last 24 hours:    Labs:   Estimated body mass index is 28.55 kg/m as calculated from the following:   Height as of 05/15/17: 5' 5.5" (1.664 m).   Weight as of 05/15/17: 79 kg (174 lb 3.2 oz).   Imaging Review Plain radiographs demonstrate medial compartment collapse with varus deformity and a medial tibial plateau fracture  Assessment/Plan:  End stage arthritis, left knee   The patient history, physical examination, clinical judgment of the provider and imaging studies are consistent with end stage degenerative joint disease of the left knee(s) and total knee arthroplasty is deemed medically necessary. The treatment options including medical management, injection therapy arthroscopy and arthroplasty were discussed at length. The risks and benefits of total knee arthroplasty were presented and reviewed. The risks due to aseptic loosening, infection, stiffness, patella tracking problems, thromboembolic complications and other imponderables were discussed. The patient acknowledged the explanation, agreed to proceed with the plan and consent was signed. Patient is being admitted for inpatient treatment for surgery, pain control, PT, OT, prophylactic antibiotics, VTE prophylaxis, progressive ambulation and ADL's and discharge planning. The patient is planning to be  discharged home with home health services

## 2017-05-21 ENCOUNTER — Inpatient Hospital Stay (HOSPITAL_COMMUNITY): Payer: Medicare HMO

## 2017-05-21 ENCOUNTER — Inpatient Hospital Stay (HOSPITAL_COMMUNITY)
Admission: RE | Admit: 2017-05-21 | Discharge: 2017-05-23 | DRG: 470 | Disposition: A | Discharge status: Home Health | Payer: Medicare HMO | Source: Ambulatory Visit | Attending: Orthopedic Surgery | Admitting: Orthopedic Surgery

## 2017-05-21 ENCOUNTER — Inpatient Hospital Stay (HOSPITAL_COMMUNITY): Payer: Medicare HMO | Admitting: Anesthesiology

## 2017-05-21 ENCOUNTER — Encounter (HOSPITAL_COMMUNITY)
Admission: RE | Disposition: A | Discharge status: Home Health | Payer: Self-pay | Source: Ambulatory Visit | Attending: Orthopedic Surgery

## 2017-05-21 ENCOUNTER — Other Ambulatory Visit: Payer: Self-pay

## 2017-05-21 ENCOUNTER — Encounter (HOSPITAL_COMMUNITY): Payer: Self-pay | Admitting: *Deleted

## 2017-05-21 DIAGNOSIS — Z885 Allergy status to narcotic agent status: Secondary | ICD-10-CM

## 2017-05-21 DIAGNOSIS — G20 Parkinson's disease: Secondary | ICD-10-CM | POA: Diagnosis not present

## 2017-05-21 DIAGNOSIS — M80062D Age-related osteoporosis with current pathological fracture, left lower leg, subsequent encounter for fracture with routine healing: Secondary | ICD-10-CM | POA: Diagnosis not present

## 2017-05-21 DIAGNOSIS — M1712 Unilateral primary osteoarthritis, left knee: Secondary | ICD-10-CM

## 2017-05-21 DIAGNOSIS — G8918 Other acute postprocedural pain: Secondary | ICD-10-CM | POA: Diagnosis not present

## 2017-05-21 DIAGNOSIS — Z471 Aftercare following joint replacement surgery: Secondary | ICD-10-CM | POA: Diagnosis not present

## 2017-05-21 DIAGNOSIS — D62 Acute posthemorrhagic anemia: Secondary | ICD-10-CM | POA: Diagnosis not present

## 2017-05-21 DIAGNOSIS — Z8249 Family history of ischemic heart disease and other diseases of the circulatory system: Secondary | ICD-10-CM | POA: Diagnosis not present

## 2017-05-21 DIAGNOSIS — J40 Bronchitis, not specified as acute or chronic: Secondary | ICD-10-CM | POA: Diagnosis not present

## 2017-05-21 DIAGNOSIS — Z96652 Presence of left artificial knee joint: Secondary | ICD-10-CM

## 2017-05-21 DIAGNOSIS — M25762 Osteophyte, left knee: Secondary | ICD-10-CM | POA: Diagnosis not present

## 2017-05-21 DIAGNOSIS — Z823 Family history of stroke: Secondary | ICD-10-CM | POA: Diagnosis not present

## 2017-05-21 DIAGNOSIS — Z86718 Personal history of other venous thrombosis and embolism: Secondary | ICD-10-CM

## 2017-05-21 DIAGNOSIS — Z96651 Presence of right artificial knee joint: Secondary | ICD-10-CM | POA: Diagnosis not present

## 2017-05-21 DIAGNOSIS — I10 Essential (primary) hypertension: Secondary | ICD-10-CM | POA: Diagnosis present

## 2017-05-21 HISTORY — PX: TOTAL KNEE ARTHROPLASTY: SHX125

## 2017-05-21 HISTORY — DX: Unilateral primary osteoarthritis, left knee: M17.12

## 2017-05-21 SURGERY — ARTHROPLASTY, KNEE, TOTAL
Anesthesia: Spinal | Laterality: Left

## 2017-05-21 MED ORDER — FENTANYL CITRATE (PF) 250 MCG/5ML IJ SOLN
INTRAMUSCULAR | Status: AC
  Filled 2017-05-21: qty 5

## 2017-05-21 MED ORDER — ONDANSETRON HCL 4 MG/2ML IJ SOLN: 4.0000 mg | Freq: Once | INTRAMUSCULAR | Status: DC | PRN

## 2017-05-21 MED ORDER — ASPIRIN EC 325 MG PO TBEC: 325.0000 mg | DELAYED_RELEASE_TABLET | Freq: Two times a day (BID) | ORAL | 0 refills | Status: DC

## 2017-05-21 MED ORDER — METHOCARBAMOL 1000 MG/10ML IJ SOLN: 500.0000 mg | Freq: Four times a day (QID) | INTRAVENOUS | Status: DC | PRN

## 2017-05-21 MED ORDER — ONDANSETRON HCL 4 MG/2ML IJ SOLN
INTRAMUSCULAR | Status: DC | PRN
Start: 2017-05-21 — End: 2017-05-21
  Administered 2017-05-21: 4 mg via INTRAVENOUS

## 2017-05-21 MED ORDER — HYDROMORPHONE HCL 2 MG PO TABS
2.0000 mg | ORAL_TABLET | ORAL | Status: DC | PRN
  Administered 2017-05-21 – 2017-05-23 (×5): 2 mg via ORAL
  Filled 2017-05-21 (×4): qty 1

## 2017-05-21 MED ORDER — HYDROMORPHONE HCL 2 MG PO TABS: 2.0000 mg | ORAL_TABLET | ORAL | 0 refills | Status: DC | PRN

## 2017-05-21 MED ORDER — LIDOCAINE 2% (20 MG/ML) 5 ML SYRINGE
INTRAMUSCULAR | Status: AC
  Filled 2017-05-21: qty 5

## 2017-05-21 MED ORDER — BISACODYL 5 MG PO TBEC: 5.0000 mg | DELAYED_RELEASE_TABLET | Freq: Every day | ORAL | Status: DC | PRN

## 2017-05-21 MED ORDER — LACTATED RINGERS IV SOLN
INTRAVENOUS | Status: DC
  Administered 2017-05-21: 09:00:00 via INTRAVENOUS

## 2017-05-21 MED ORDER — BUPIVACAINE LIPOSOME 1.3 % IJ SUSP
20.0000 mL | Freq: Once | INTRAMUSCULAR | Status: DC
  Filled 2017-05-21: qty 20

## 2017-05-21 MED ORDER — SENNOSIDES-DOCUSATE SODIUM 8.6-50 MG PO TABS
1.0000 | ORAL_TABLET | Freq: Every evening | ORAL | Status: DC | PRN
Start: 2017-05-21 — End: 2017-05-23

## 2017-05-21 MED ORDER — LATANOPROST 0.005 % OP SOLN
1.0000 [drp] | Freq: Every day | OPHTHALMIC | Status: DC
  Administered 2017-05-21 – 2017-05-22 (×2): 1 [drp] via OPHTHALMIC
  Filled 2017-05-21: qty 2.5

## 2017-05-21 MED ORDER — HYDROMORPHONE HCL 2 MG PO TABS
ORAL_TABLET | ORAL | Status: AC
  Filled 2017-05-21: qty 1

## 2017-05-21 MED ORDER — TRANEXAMIC ACID 1000 MG/10ML IV SOLN
1000.0000 mg | INTRAVENOUS | Status: DC
  Filled 2017-05-21: qty 10

## 2017-05-21 MED ORDER — DIPHENHYDRAMINE HCL 12.5 MG/5ML PO ELIX
12.5000 mg | ORAL_SOLUTION | ORAL | Status: DC | PRN
  Administered 2017-05-21: 25 mg via ORAL
  Filled 2017-05-21: qty 10

## 2017-05-21 MED ORDER — BUPIVACAINE-EPINEPHRINE (PF) 0.5% -1:200000 IJ SOLN
INTRAMUSCULAR | Status: DC | PRN
  Administered 2017-05-21: 30 mL via PERINEURAL

## 2017-05-21 MED ORDER — ALUM & MAG HYDROXIDE-SIMETH 200-200-20 MG/5ML PO SUSP: 30.0000 mL | ORAL | Status: DC | PRN

## 2017-05-21 MED ORDER — MIDAZOLAM HCL 2 MG/2ML IJ SOLN
1.0000 mg | Freq: Once | INTRAMUSCULAR | Status: AC
  Administered 2017-05-21: 1 mg via INTRAVENOUS

## 2017-05-21 MED ORDER — TIZANIDINE HCL 2 MG PO TABS: 2.0000 mg | ORAL_TABLET | Freq: Four times a day (QID) | ORAL | 0 refills | Status: DC | PRN

## 2017-05-21 MED ORDER — KCL IN DEXTROSE-NACL 20-5-0.45 MEQ/L-%-% IV SOLN
INTRAVENOUS | Status: DC
  Administered 2017-05-21: 17:00:00 via INTRAVENOUS
  Filled 2017-05-21: qty 1000

## 2017-05-21 MED ORDER — SODIUM CHLORIDE 0.9 % IJ SOLN
INTRAMUSCULAR | Status: DC | PRN
  Administered 2017-05-21: 50 mL

## 2017-05-21 MED ORDER — FENTANYL CITRATE (PF) 250 MCG/5ML IJ SOLN
INTRAMUSCULAR | Status: DC | PRN
  Administered 2017-05-21: 50 ug via INTRAVENOUS

## 2017-05-21 MED ORDER — ASPIRIN EC 325 MG PO TBEC
325.0000 mg | DELAYED_RELEASE_TABLET | Freq: Every day | ORAL | Status: DC
  Administered 2017-05-22 – 2017-05-23 (×2): 325 mg via ORAL
  Filled 2017-05-21 (×2): qty 1

## 2017-05-21 MED ORDER — PRAMIPEXOLE DIHYDROCHLORIDE 1.5 MG PO TABS
1.5000 mg | ORAL_TABLET | Freq: Three times a day (TID) | ORAL | Status: DC
  Administered 2017-05-21 – 2017-05-23 (×6): 1.5 mg via ORAL
  Filled 2017-05-21 (×8): qty 1

## 2017-05-21 MED ORDER — OXYCODONE HCL 5 MG PO TABS
ORAL_TABLET | ORAL | Status: AC
  Filled 2017-05-21: qty 1

## 2017-05-21 MED ORDER — MIDAZOLAM HCL 5 MG/5ML IJ SOLN
INTRAMUSCULAR | Status: DC | PRN
  Administered 2017-05-21: 0.5 mg via INTRAVENOUS
  Administered 2017-05-21: .5 mg via INTRAVENOUS

## 2017-05-21 MED ORDER — MENTHOL 3 MG MT LOZG: 1.0000 | LOZENGE | OROMUCOSAL | Status: DC | PRN

## 2017-05-21 MED ORDER — PROPOFOL 500 MG/50ML IV EMUL
INTRAVENOUS | Status: DC | PRN
  Administered 2017-05-21: 80 ug/kg/min via INTRAVENOUS

## 2017-05-21 MED ORDER — METOCLOPRAMIDE HCL 5 MG/ML IJ SOLN: 5.0000 mg | Freq: Three times a day (TID) | INTRAMUSCULAR | Status: DC | PRN

## 2017-05-21 MED ORDER — PHENOL 1.4 % MT LIQD: 1.0000 | OROMUCOSAL | Status: DC | PRN

## 2017-05-21 MED ORDER — MIRTAZAPINE 15 MG PO TABS
15.0000 mg | ORAL_TABLET | Freq: Every day | ORAL | Status: DC
  Administered 2017-05-21 – 2017-05-22 (×2): 15 mg via ORAL
  Filled 2017-05-21 (×2): qty 1

## 2017-05-21 MED ORDER — OXYCODONE HCL 5 MG PO TABS
5.0000 mg | ORAL_TABLET | Freq: Once | ORAL | Status: AC | PRN
  Administered 2017-05-21: 5 mg via ORAL

## 2017-05-21 MED ORDER — FENTANYL CITRATE (PF) 100 MCG/2ML IJ SOLN
INTRAMUSCULAR | Status: AC
  Filled 2017-05-21: qty 2

## 2017-05-21 MED ORDER — BUPIVACAINE-EPINEPHRINE (PF) 0.25% -1:200000 IJ SOLN
INTRAMUSCULAR | Status: DC | PRN
  Administered 2017-05-21: 50 mL

## 2017-05-21 MED ORDER — ONDANSETRON HCL 4 MG/2ML IJ SOLN: 4.0000 mg | Freq: Four times a day (QID) | INTRAMUSCULAR | Status: DC | PRN

## 2017-05-21 MED ORDER — CHLORHEXIDINE GLUCONATE 4 % EX LIQD: 60.0000 mL | Freq: Once | CUTANEOUS | Status: DC

## 2017-05-21 MED ORDER — CELECOXIB 200 MG PO CAPS
200.0000 mg | ORAL_CAPSULE | Freq: Two times a day (BID) | ORAL | Status: DC
  Administered 2017-05-21 – 2017-05-23 (×4): 200 mg via ORAL
  Filled 2017-05-21 (×5): qty 1

## 2017-05-21 MED ORDER — BUPIVACAINE IN DEXTROSE 0.75-8.25 % IT SOLN
INTRATHECAL | Status: DC | PRN
  Administered 2017-05-21: 1.8 mL via INTRATHECAL

## 2017-05-21 MED ORDER — CEFAZOLIN SODIUM-DEXTROSE 2-4 GM/100ML-% IV SOLN
INTRAVENOUS | Status: AC
  Filled 2017-05-21: qty 100

## 2017-05-21 MED ORDER — ACETAMINOPHEN 325 MG PO TABS
650.0000 mg | ORAL_TABLET | ORAL | Status: DC | PRN
  Administered 2017-05-21: 650 mg via ORAL
  Filled 2017-05-21: qty 2

## 2017-05-21 MED ORDER — DOCUSATE SODIUM 100 MG PO CAPS
100.0000 mg | ORAL_CAPSULE | Freq: Two times a day (BID) | ORAL | Status: DC
  Administered 2017-05-21 – 2017-05-23 (×4): 100 mg via ORAL
  Filled 2017-05-21 (×4): qty 1

## 2017-05-21 MED ORDER — MIDAZOLAM HCL 2 MG/2ML IJ SOLN
INTRAMUSCULAR | Status: AC
  Filled 2017-05-21: qty 2

## 2017-05-21 MED ORDER — METHOCARBAMOL 500 MG PO TABS
500.0000 mg | ORAL_TABLET | Freq: Four times a day (QID) | ORAL | Status: DC | PRN
  Administered 2017-05-21: 500 mg via ORAL

## 2017-05-21 MED ORDER — CEFAZOLIN SODIUM-DEXTROSE 2-4 GM/100ML-% IV SOLN
2.0000 g | INTRAVENOUS | Status: AC
  Administered 2017-05-21: 2 g via INTRAVENOUS

## 2017-05-21 MED ORDER — ACETAMINOPHEN 650 MG RE SUPP: 650.0000 mg | RECTAL | Status: DC | PRN

## 2017-05-21 MED ORDER — FENTANYL CITRATE (PF) 100 MCG/2ML IJ SOLN
25.0000 ug | INTRAMUSCULAR | Status: DC | PRN
  Administered 2017-05-21 (×2): 50 ug via INTRAVENOUS

## 2017-05-21 MED ORDER — EPHEDRINE SULFATE-NACL 50-0.9 MG/10ML-% IV SOSY
PREFILLED_SYRINGE | INTRAVENOUS | Status: DC | PRN
  Administered 2017-05-21: 5 mg via INTRAVENOUS

## 2017-05-21 MED ORDER — FENTANYL CITRATE (PF) 100 MCG/2ML IJ SOLN
50.0000 ug | Freq: Once | INTRAMUSCULAR | Status: AC
  Administered 2017-05-21: 50 ug via INTRAVENOUS

## 2017-05-21 MED ORDER — TRANEXAMIC ACID 1000 MG/10ML IV SOLN
1000.0000 mg | Freq: Once | INTRAVENOUS | Status: AC
  Administered 2017-05-21: 1000 mg via INTRAVENOUS
  Filled 2017-05-21: qty 10

## 2017-05-21 MED ORDER — CARBIDOPA-LEVODOPA 25-100 MG PO TABS
1.0000 | ORAL_TABLET | Freq: Three times a day (TID) | ORAL | Status: DC
  Administered 2017-05-21 – 2017-05-23 (×7): 1 via ORAL
  Filled 2017-05-21 (×8): qty 1

## 2017-05-21 MED ORDER — TRANEXAMIC ACID 1000 MG/10ML IV SOLN
1000.0000 mg | INTRAVENOUS | Status: AC
  Administered 2017-05-21: 1000 mg via INTRAVENOUS
  Filled 2017-05-21: qty 1.1

## 2017-05-21 MED ORDER — ONDANSETRON HCL 4 MG PO TABS: 4.0000 mg | ORAL_TABLET | Freq: Four times a day (QID) | ORAL | Status: DC | PRN

## 2017-05-21 MED ORDER — ONDANSETRON HCL 4 MG/2ML IJ SOLN
INTRAMUSCULAR | Status: AC
  Filled 2017-05-21: qty 2

## 2017-05-21 MED ORDER — SODIUM CHLORIDE 0.9 % IR SOLN
Status: DC | PRN
  Administered 2017-05-21: 3000 mL
  Administered 2017-05-21: 1000 mL

## 2017-05-21 MED ORDER — HYDROMORPHONE HCL 1 MG/ML IJ SOLN: 0.5000 mg | INTRAMUSCULAR | Status: DC | PRN

## 2017-05-21 MED ORDER — OXYCODONE HCL 5 MG/5ML PO SOLN: 5.0000 mg | Freq: Once | ORAL | Status: AC | PRN

## 2017-05-21 MED ORDER — DEXAMETHASONE SODIUM PHOSPHATE 10 MG/ML IJ SOLN
10.0000 mg | Freq: Once | INTRAMUSCULAR | Status: AC
  Administered 2017-05-22: 10 mg via INTRAVENOUS
  Filled 2017-05-21: qty 1

## 2017-05-21 MED ORDER — METOCLOPRAMIDE HCL 5 MG PO TABS: 5.0000 mg | ORAL_TABLET | Freq: Three times a day (TID) | ORAL | Status: DC | PRN

## 2017-05-21 MED ORDER — BUPIVACAINE-EPINEPHRINE 0.25% -1:200000 IJ SOLN
INTRAMUSCULAR | Status: AC
  Filled 2017-05-21: qty 1

## 2017-05-21 MED ORDER — MIDAZOLAM HCL 2 MG/2ML IJ SOLN
INTRAMUSCULAR | Status: AC
  Administered 2017-05-21: 1 mg via INTRAVENOUS
  Filled 2017-05-21: qty 2

## 2017-05-21 MED ORDER — LIDOCAINE HCL (CARDIAC) 20 MG/ML IV SOLN
INTRAVENOUS | Status: DC | PRN
  Administered 2017-05-21: 50 mg via INTRAVENOUS

## 2017-05-21 MED ORDER — BUPIVACAINE LIPOSOME 1.3 % IJ SUSP
INTRAMUSCULAR | Status: DC | PRN
  Administered 2017-05-21: 20 mL

## 2017-05-21 MED ORDER — GABAPENTIN 300 MG PO CAPS
300.0000 mg | ORAL_CAPSULE | Freq: Three times a day (TID) | ORAL | Status: DC
  Administered 2017-05-21 – 2017-05-23 (×6): 300 mg via ORAL
  Filled 2017-05-21 (×6): qty 1

## 2017-05-21 MED ORDER — FENTANYL CITRATE (PF) 100 MCG/2ML IJ SOLN
INTRAMUSCULAR | Status: AC
  Administered 2017-05-21: 50 ug via INTRAVENOUS
  Filled 2017-05-21: qty 2

## 2017-05-21 MED ORDER — FLEET ENEMA 7-19 GM/118ML RE ENEM: 1.0000 | ENEMA | Freq: Once | RECTAL | Status: DC | PRN

## 2017-05-21 SURGICAL SUPPLY — 55 items
ATTUNE KNEE REV P/F STEM10X110 (Joint) ×2 IMPLANT
BANDAGE ACE 6X5 VEL STRL LF (GAUZE/BANDAGES/DRESSINGS) ×2 IMPLANT
BANDAGE ESMARK 6X9 LF (GAUZE/BANDAGES/DRESSINGS) ×1 IMPLANT
BLADE SAG 18X100X1.27 (BLADE) ×2 IMPLANT
BLADE SAGITTAL 13X1.27X60 (BLADE) IMPLANT
BLADE SAW SGTL 13X75X1.27 (BLADE) IMPLANT
BNDG ELASTIC 6X10 VLCR STRL LF (GAUZE/BANDAGES/DRESSINGS) ×2 IMPLANT
BNDG ESMARK 6X9 LF (GAUZE/BANDAGES/DRESSINGS) ×2
BOWL SMART MIX CTS (DISPOSABLE) ×2 IMPLANT
CAPT KNEE TOTAL 3 ATTUNE ×2 IMPLANT
CEMENT HV SMART SET (Cement) ×4 IMPLANT
COVER SURGICAL LIGHT HANDLE (MISCELLANEOUS) ×2 IMPLANT
CUFF TOURNIQUET SINGLE 34IN LL (TOURNIQUET CUFF) ×2 IMPLANT
CUFF TOURNIQUET SINGLE 44IN (TOURNIQUET CUFF) IMPLANT
DRAPE EXTREMITY T 121X128X90 (DRAPE) ×2 IMPLANT
DRAPE U-SHAPE 47X51 STRL (DRAPES) ×2 IMPLANT
DRSG AQUACEL AG ADV 3.5X 6 (GAUZE/BANDAGES/DRESSINGS) ×2 IMPLANT
DRSG AQUACEL AG ADV 3.5X10 (GAUZE/BANDAGES/DRESSINGS) ×2 IMPLANT
DURAPREP 26ML APPLICATOR (WOUND CARE) ×2 IMPLANT
ELECT REM PT RETURN 9FT ADLT (ELECTROSURGICAL) ×2
ELECTRODE REM PT RTRN 9FT ADLT (ELECTROSURGICAL) ×1 IMPLANT
GLOVE BIO SURGEON STRL SZ7.5 (GLOVE) ×2 IMPLANT
GLOVE BIO SURGEON STRL SZ8.5 (GLOVE) ×2 IMPLANT
GLOVE BIOGEL PI IND STRL 8 (GLOVE) ×1 IMPLANT
GLOVE BIOGEL PI IND STRL 9 (GLOVE) ×1 IMPLANT
GLOVE BIOGEL PI INDICATOR 8 (GLOVE) ×1
GLOVE BIOGEL PI INDICATOR 9 (GLOVE) ×1
GOWN STRL REUS W/ TWL LRG LVL3 (GOWN DISPOSABLE) ×1 IMPLANT
GOWN STRL REUS W/ TWL XL LVL3 (GOWN DISPOSABLE) ×2 IMPLANT
GOWN STRL REUS W/TWL LRG LVL3 (GOWN DISPOSABLE) ×1
GOWN STRL REUS W/TWL XL LVL3 (GOWN DISPOSABLE) ×2
HANDPIECE INTERPULSE COAX TIP (DISPOSABLE) ×1
HOOD PEEL AWAY FACE SHEILD DIS (HOOD) ×4 IMPLANT
IMMOBILIZER KNEE 22 (SOFTGOODS) ×2 IMPLANT
KIT BASIN OR (CUSTOM PROCEDURE TRAY) ×2 IMPLANT
KIT ROOM TURNOVER OR (KITS) ×2 IMPLANT
MANIFOLD NEPTUNE II (INSTRUMENTS) ×2 IMPLANT
NEEDLE 22X1 1/2 (OR ONLY) (NEEDLE) ×4 IMPLANT
NS IRRIG 1000ML POUR BTL (IV SOLUTION) ×2 IMPLANT
PACK TOTAL JOINT (CUSTOM PROCEDURE TRAY) ×2 IMPLANT
PAD ARMBOARD 7.5X6 YLW CONV (MISCELLANEOUS) ×4 IMPLANT
SET HNDPC FAN SPRY TIP SCT (DISPOSABLE) ×1 IMPLANT
SUT VIC AB 0 CT1 27 (SUTURE) ×1
SUT VIC AB 0 CT1 27XBRD ANBCTR (SUTURE) ×1 IMPLANT
SUT VIC AB 1 CTX 36 (SUTURE) ×1
SUT VIC AB 1 CTX36XBRD ANBCTR (SUTURE) ×1 IMPLANT
SUT VIC AB 2-0 CT1 27 (SUTURE) ×1
SUT VIC AB 2-0 CT1 TAPERPNT 27 (SUTURE) ×1 IMPLANT
SUT VIC AB 3-0 CT1 27 (SUTURE) ×1
SUT VIC AB 3-0 CT1 TAPERPNT 27 (SUTURE) ×1 IMPLANT
SYR CONTROL 10ML LL (SYRINGE) ×4 IMPLANT
TOWEL OR 17X24 6PK STRL BLUE (TOWEL DISPOSABLE) ×2 IMPLANT
TOWEL OR 17X26 10 PK STRL BLUE (TOWEL DISPOSABLE) ×2 IMPLANT
TRAY CATH 16FR W/PLASTIC CATH (SET/KITS/TRAYS/PACK) IMPLANT
UPCHARGE REV TRAY MBT KNEE ×2 IMPLANT

## 2017-05-21 NOTE — Interval H&P Note (Signed)
History and Physical Interval Note:  05/21/2017 9:21 AM  Carly [name]  has presented today for surgery, with the diagnosis of LEFT KNEE OSTEOARTHRITIS, POSSIBLE TIBIA PLATEAU FRACTUE  The various methods of treatment have been discussed with the patient and family. After consideration of risks, benefits and other options for treatment, the patient has consented to  Procedure(s): TOTAL KNEE ARTHROPLASTY (Left) as a surgical intervention .  The patient's history has been reviewed, patient examined, no change in status, stable for surgery.  I have reviewed the patient's chart and labs.  Questions were answered to the patient's satisfaction.     Nestor LewandowskyFrank J Jodeen Mclin

## 2017-05-21 NOTE — Evaluation (Signed)
Physical Therapy Evaluation Patient Details Name: Carly EvertsBeverly B Napolitano MRN: [id number] DOB: Apr 21, 1945 Today's Date: 05/21/2017   History of Present Illness  Pt is a 72 y/o female s/p elective L TKA. PMH inlcudes HTN, DVT, and Parkinson's Disease.   Clinical Impression  Pt is s/p surgery above with deficits below. Pt very restless during session secondary to pain and had some difficulty sequencing during mobility, therefore mobility limited to chair. Required min A for mobility with RW. Anticipate pt will progress well once pain has improved. Will continue to follow acutely to maximize functional mobility independence and safety.     Follow Up Recommendations Follow surgeon's recommendation for DC plan and follow-up therapies;Supervision for mobility/OOB    Equipment Recommendations  None recommended by PT    Recommendations for Other Services       Precautions / Restrictions Precautions Precautions: Knee Precaution Booklet Issued: Yes (comment) Precaution Comments: Reviewed supine ther ex with pt. Pt very guarded in LLE throughout secondary to pain.  Restrictions Weight Bearing Restrictions: Yes LLE Weight Bearing: Weight bearing as tolerated      Mobility  Bed Mobility Overal bed mobility: Needs Assistance Bed Mobility: Supine to Sit     Supine to sit: Min guard     General bed mobility comments: Min guard for safety. Increased time required to perform.   Transfers Overall transfer level: Needs assistance Equipment used: Rolling walker (2 wheeled) Transfers: Sit to/from UGI CorporationStand;Stand Pivot Transfers Sit to Stand: Min assist Stand pivot transfers: Min assist       General transfer comment: Min A for lift assist and steadying. Verbal cues for safe hand placement. Pt only able to tolerate stand pivot to chair. Noted difficulty sequencing and required manual and verbal cues for sequencing. Min A required for steadying.   Ambulation/Gait         Gait velocity: NT       Stairs            Wheelchair Mobility    Modified Rankin (Stroke Patients Only)       Balance Overall balance assessment: Needs assistance Sitting-balance support: No upper extremity supported;Feet supported Sitting balance-Leahy Scale: Good     Standing balance support: Bilateral upper extremity supported;During functional activity Standing balance-Leahy Scale: Poor Standing balance comment: Reliant on BUE support.                              Pertinent Vitals/Pain Pain Assessment: 0-10 Pain Score: 5  Pain Location: L knee  Pain Descriptors / Indicators: Aching;Operative site guarding Pain Intervention(s): Limited activity within patient's tolerance;Monitored during session;Repositioned    Home Living Family/patient expects to be discharged to:: Private residence Living Arrangements: Spouse/significant other Available Help at Discharge: Family;Available 24 hours/day Type of Home: House Home Access: Level entry     Home Layout: Two level;Able to live on main level with bedroom/bathroom Home Equipment: Bedside commode;Walker - 2 wheels;Walker - 4 wheels      Prior Function Level of Independence: Independent               Hand Dominance   Dominant Hand: Right    Extremity/Trunk Assessment   Upper Extremity Assessment Upper Extremity Assessment: Defer to OT evaluation    Lower Extremity Assessment Lower Extremity Assessment: LLE deficits/detail LLE Deficits / Details: Slightly decreased sensation reported in toes. Able to perform ther ex below. Deficits consistent with post op pain and weakness.     Cervical /  Trunk Assessment Cervical / Trunk Assessment: Kyphotic  Communication   Communication: No difficulties  Cognition Arousal/Alertness: Suspect due to medications Behavior During Therapy: Restless Overall Cognitive Status: Within Functional Limits for tasks assessed                                 General  Comments: Pt very restless throughout as she could not find a comfortable position. Required increased time to follow commands and somewhat sleepy. May be due to medications.       General Comments General comments (skin integrity, edema, etc.): Pt's son and daughter in law present during session.     Exercises Total Joint Exercises Ankle Circles/Pumps: AROM;Both;20 reps Quad Sets: (unable to appropriately sequence. Will need review ) Towel Squeeze: AROM;Both;10 reps Heel Slides: AROM;Left;10 reps   Assessment/Plan    PT Assessment Patient needs continued PT services  PT Problem List Decreased strength;Decreased range of motion;Decreased activity tolerance;Decreased balance;Decreased mobility;Decreased knowledge of use of DME;Decreased knowledge of precautions;Pain;Impaired sensation;Decreased safety awareness       PT Treatment Interventions DME instruction;Gait training;Functional mobility training;Therapeutic exercise;Therapeutic activities;Balance training;Neuromuscular re-education;Patient/family education    PT Goals (Current goals can be found in the Care Plan section)  Acute Rehab PT Goals Patient Stated Goal: to get better  PT Goal Formulation: With patient Time For Goal Achievement: 06/04/17 Potential to Achieve Goals: Good    Frequency 7X/week   Barriers to discharge        Co-evaluation               AM-PAC PT "6 Clicks" Daily Activity  Outcome Measure Difficulty turning over in bed (including adjusting bedclothes, sheets and blankets)?: A Little Difficulty moving from lying on back to sitting on the side of the bed? : Unable Difficulty sitting down on and standing up from a chair with arms (e.g., wheelchair, bedside commode, etc,.)?: Unable Help needed moving to and from a bed to chair (including a wheelchair)?: A Little Help needed walking in hospital room?: A Lot Help needed climbing 3-5 steps with a railing? : A Lot 6 Click Score: 12    End of  Session Equipment Utilized During Treatment: Gait belt Activity Tolerance: Patient limited by pain Patient left: in chair;with call bell/phone within reach;with family/visitor present Nurse Communication: Mobility status PT Visit Diagnosis: Unsteadiness on feet (R26.81);Other abnormalities of gait and mobility (R26.89);Pain Pain - Right/Left: Left Pain - part of body: Knee    Time: 6962-9528 PT Time Calculation (min) (ACUTE ONLY): 27 min   Charges:   PT Evaluation $PT Eval Low Complexity: 1 Low PT Treatments $Therapeutic Activity: 8-22 mins   PT G Codes:        Gladys Damme, PT, DPT  Acute Rehabilitation Services  Pager: [phone]   Lehman Prom 05/21/2017, 5:20 PM

## 2017-05-21 NOTE — Anesthesia Preprocedure Evaluation (Addendum)
Anesthesia Evaluation  Patient identified by MRN, date of birth, ID band Patient awake    Reviewed: Allergy & Precautions, NPO status , Patient's Chart, lab work & pertinent test results  Airway Mallampati: II  TM Distance: >3 FB Neck ROM: Full    Dental  (+) Dental Advisory Given, Teeth Intact   Pulmonary neg pulmonary ROS,    Pulmonary exam normal breath sounds clear to auscultation       Cardiovascular hypertension, + DVT  Normal cardiovascular exam Rhythm:Regular Rate:Normal  EKG - sinus brady   Neuro/Psych Anxiety Parkinson's disease    GI/Hepatic negative GI ROS, Neg liver ROS,   Endo/Other  negative endocrine ROS  Renal/GU Renal InsufficiencyRenal disease  negative genitourinary   Musculoskeletal  (+) Arthritis ,   Abdominal   Peds  Hematology  (+) anemia ,   Anesthesia Other Findings   Reproductive/Obstetrics                            Anesthesia Physical Anesthesia Plan  ASA: III  Anesthesia Plan: Spinal   Post-op Pain Management:  Regional for Post-op pain   Induction:   PONV Risk Score and Plan: Treatment may vary due to age or medical condition and Propofol infusion  Airway Management Planned: Natural Airway and Nasal Cannula  Additional Equipment: None  Intra-op Plan:   Post-operative Plan:   Informed Consent: I have reviewed the patients History and Physical, chart, labs and discussed the procedure including the risks, benefits and alternatives for the proposed anesthesia with the patient or authorized representative who has indicated his/her understanding and acceptance.     Plan Discussed with: CRNA  Anesthesia Plan Comments:         Anesthesia Quick Evaluation

## 2017-05-21 NOTE — Anesthesia Procedure Notes (Signed)
Procedures

## 2017-05-21 NOTE — Transfer of Care (Signed)
Immediate Anesthesia Transfer of Care Note  Patient: Carly [name]  Procedure(s) Performed: TOTAL KNEE ARTHROPLASTY (Left )  Patient Location: PACU  Anesthesia Type:Spinal and GA combined with regional for post-op pain  Level of Consciousness: awake, alert  and oriented  Airway & Oxygen Therapy: Patient Spontanous Breathing  Post-op Assessment: Report given to RN and Post -op Vital signs reviewed and stable  Post vital signs: Reviewed and stable  Last Vitals:  Vitals:   05/21/17 0758 05/21/17 0759  BP:  (!) 159/64  Pulse: 67   Resp: 18   Temp: 36.5 C   SpO2: 100%     Last Pain:  Vitals:   05/21/17 0817  TempSrc:   PainSc: 10-Worst pain ever      Patients Stated Pain Goal: 3 (05/21/17 0817)  Complications: No apparent anesthesia complications

## 2017-05-21 NOTE — Progress Notes (Signed)
Orthopedic Tech Progress Note Patient Details:  Carly [name] Jun 30, 1945 [id number]  Ortho Devices Ortho Device/Splint Location: foot roll   Post Interventions Patient Tolerated: Well   Saul FordyceJennifer C Scarlett Portlock 05/21/2017, 12:27 PM

## 2017-05-21 NOTE — Anesthesia Postprocedure Evaluation (Signed)
Anesthesia Post Note  Patient: Laren EvertsBeverly B Fonner  Procedure(s) Performed: TOTAL KNEE ARTHROPLASTY (Left )     Patient location during evaluation: PACU Anesthesia Type: Spinal Level of consciousness: awake and alert Pain management: pain level controlled Vital Signs Assessment: post-procedure vital signs reviewed and stable Respiratory status: spontaneous breathing and respiratory function stable Cardiovascular status: blood pressure returned to baseline and stable Postop Assessment: spinal receding and no apparent nausea or vomiting Anesthetic complications: no    Last Vitals:  Vitals:   05/21/17 1152 05/21/17 1207  BP: (!) 148/58 (!) 135/54  Pulse: 66 71  Resp: 15 14  Temp:    SpO2: 100% 100%    Last Pain:  Vitals:   05/21/17 1207  TempSrc:   PainSc: 0-No pain    LLE Motor Response: Purposeful movement(external & internal rotation) (05/21/17 1207) LLE Sensation: Decreased;Numbness (05/21/17 1207) RLE Motor Response: Purposeful movement(external & interal rotation) (05/21/17 1207) RLE Sensation: Decreased;Numbness (05/21/17 1207) L Sensory Level: L5-Outer lower leg, top of foot, great toe (05/21/17 1207) R Sensory Level: L5-Outer lower leg, top of foot, great toe (05/21/17 1207)  Beryle Lathehomas E Royetta Probus

## 2017-05-21 NOTE — Anesthesia Procedure Notes (Signed)
Procedure Name: MAC Date/Time: 05/21/2017 9:57 AM Performed by: Teressa Lower., CRNA Pre-anesthesia Checklist: Patient identified, Emergency Drugs available, Suction available, Patient being monitored and Timeout performed Patient Re-evaluated:Patient Re-evaluated prior to induction Oxygen Delivery Method: Simple face mask

## 2017-05-21 NOTE — Anesthesia Procedure Notes (Signed)
Spinal  Patient location during procedure: OR Start time: 05/21/2017 9:32 AM End time: 05/21/2017 9:36 AM Staffing Anesthesiologist: Beryle LatheBrock, Zya Finkle E, MD Performed: anesthesiologist  Preanesthetic Checklist Completed: patient identified, surgical consent, pre-op evaluation, timeout performed, IV checked, risks and benefits discussed and monitors and equipment checked Spinal Block Patient position: sitting Prep: DuraPrep Patient monitoring: heart rate, cardiac monitor, continuous pulse ox and blood pressure Approach: midline Location: L3-4 Injection technique: single-shot Needle Needle type: Pencan  Needle gauge: 24 G Additional Notes Functioning IV was confirmed and monitors were applied. Sterile prep and drape, including hand hygiene, mask, and sterile gloves were used. The patient was positioned and the spine was prepped. The skin was anesthetized with lidocaine. Free flow of clear CSF was obtained prior to injecting local anesthetic into the CSF. The spinal needle aspirated freely following injection. The needle was carefully withdrawn. The patient tolerated the procedure well. Consent was obtained prior to the procedure with all questions answered and concerns addressed. Risks including, but not limited to, bleeding, infection, nerve damage, paralysis, failed block, inadequate analgesia, allergic reaction, high spinal, itching, and headache were discussed and the patient wished to proceed.  Leslye Peerhomas Bartt Gonzaga, MD

## 2017-05-21 NOTE — Op Note (Signed)
PATIENT ID:      Carly [name]  MRN:     [id number] DOB/AGE:    Feb 25, 1946 / 72 y.o.       OPERATIVE REPORT    DATE OF PROCEDURE:  05/21/2017       PREOPERATIVE DIAGNOSIS:   LEFT KNEE OSTEOARTHRITIS, POSSIBLE TIBIA PLATEAU FRACTURE      Estimated body mass index is 28.51 kg/m as calculated from the following:   Height as of this encounter: 5' 5.5" (1.664 m).   Weight as of this encounter: 174 lb (78.9 kg).                                                        POSTOPERATIVE DIAGNOSIS:   LEFT KNEE OSTEOARTHRITIS, Medial tibial plateau fracture from bony insufficiency           PROCEDURE:  Procedure(s): TOTAL KNEE ARTHROPLASTY Using DepuyAttune RP implants #5L Femur, #5 Rev RPTibia, 10 mm x 110 mm tibial stem, 5 mm Attune RP bearing, 35mm patella     SURGEON: Nestor Lewandowsky    ASSISTANT:   Tomi Likens. [employer]   (Present and scrubbed throughout the case, critical for assistance with exposure, retraction, instrumentation, and closure.)         ANESTHESIA: Spinal, 20cc Exparel, 50cc 0.25% Marcaine  EBL: Cc  FLUID REPLACEMENT: 1500 cc crystalloid  TOURNIQUET TIME: None  Drains: None  Tranexamic Acid: 1gm IV, 2gm topical  COMPLICATIONS:  None         INDICATIONS FOR PROCEDURE: The patient has  LEFT KNEE OSTEOARTHRITIS, POSSIBLE TIBIA PLATEAU FRACTUE, with bone-on-bone varus deformities, XR shows bone on bone arthritis, lateral subluxation of tibia.  Patient been treated by another physician over the years and in the last 8 months developed an attritional fracture of the medial tibial plateau.  We saw her in consultation and based on her bony architecture will probably do a primary total knee but the tibial implant will be stemmed based on the insufficiency fracture of the medial tibial plateau.  Patient has failed all conservative measures including anti-inflammatory medicines, narcotics, attempts at  exercise and weight loss, cortisone injections and viscosupplementation.  Risks and  benefits of surgery have been discussed, questions answered.   DESCRIPTION OF PROCEDURE: The patient identified by armband, received  IV antibiotics, in the holding area at [facility]. Patient taken to the operating room, appropriate anesthetic  monitors were attached, and spinal anesthesia was  induced. Tourniquet  applied high to the operative thigh. Lateral post and foot positioner  applied to the table, the lower extremity was then prepped and draped  in usual sterile fashion from the toes to the tourniquet. Time-out procedure was performed. We began the operation, with the knee flexed 120 degrees, by making the anterior midline incision starting at handbreadth above the patella going over the patella 1 cm medial to and 4 cm distal to the tibial tubercle. Small bleeders in the skin and the  subcutaneous tissue identified and cauterized. Transverse retinaculum was incised and reflected medially and a medial parapatellar arthrotomy was accomplished. the patella was everted and theprepatellar fat pad resected. The superficial medial collateral  ligament was then elevated from anterior to posterior along the proximal  flare of the tibia and anterior half of the menisci resected. The knee was hyperflexed  exposing bone on bone arthritis. Peripheral and notch osteophytes as well as the cruciate ligaments were then resected. We continued to  work our way around posteriorly along the proximal tibia, and externally  rotated the tibia subluxing it out from underneath the femur. A McHale  retractor was placed through the notch and a lateral Hohmann retractor  placed, and we then drilled through the proximal tibia in line with the  axis of the tibia followed by an intramedullary guide rod and 2-degree  posterior slope cutting guide. The tibial cutting guide, 5 degree posterior sloped, was pinned into place allowing resection of 5 mm of bone medially and the mm of bone laterally.  Once we resected the  proximal tibia she did obviously have a deficiency posterior medially about 5 mm in width and a centimeter in length.  The bone of the proximal tibia was also noted to be somewhat spongy consistent with osteoporosis.  Satisfied with the tibial resection, we then  entered the distal femur 2 mm anterior to the PCL origin with the  intramedullary guide rod and applied the distal femoral cutting guide  set at 9 mm, with 5 degrees of valgus. This was pinned along the  epicondylar axis. At this point, the distal femoral cut was accomplished without difficulty. We then sized for a #5 left femoral component and pinned the guide in 3 degrees of external rotation. The chamfer cutting guide was pinned into place. The anterior, posterior, and chamfer cuts were accomplished without difficulty followed by  the Attune RP box cutting guide and the box cut. We also removed posterior osteophytes from the posterior femoral condyles. At this  time, the knee was brought into full extension. We checked our  extension and flexion gaps and found them symmetric for a 5 mm bearing. Distracting in extension with a lamina spreader, the posterior horns of the menisci were removed, and Exparel, diluted to 60 cc, with 20cc NS, and 20cc 0.5% Marcaine,was injected into the capsule and synovium of the knee. The posterior patella cut was accomplished with the 9.5 mm Attune cutting guide, sized for a 35 mm dome, and the fixation pegs drilled.The knee  was then once again hyperflexed exposing the proximal tibia. We sized for a #5 attune RP revision tibial base plate.  We then sequentially reamed up to 10 mm to the appropriate depth for a 110 mm stem.We Applied the smokestack and the conical reamer followed by the the Delta fin keel punch.  A #5 trial with a 110 x 10 mm stem was inserted.  We then hammered into place the Attune RP trial femoral component, drilled the lugs, inserted a 5 mm trial bearing, trial patellar button, and took the knee  through range of motion from 0-130 degrees. No thumb pressure was required for patellar Tracking. All trial components were removed, mating surfaces irrigated with pulse lavage, and dried with suction and sponges. 10 cc of the Exparel solution was applied to the cancellus bone of the patella distal femur and proximal tibia.  After waiting 1 minute, the bony surfaces were again, dried with sponges. A double batch of DePuy HV cement with 1500 mg of Zinacef was mixed and applied to all bony metallic mating surfaces except for the posterior condyles of the femur itself. In order, we hammered into place the tibial attune revision RP tray with 10 mm x 110 mm stem and removed excess cement, the femoral component and removed excess cement. The final Attune RP bearing  was  inserted, and the knee brought to full extension with compression.  The patellar button was clamped into place, and excess cement  removed. While the cement cured the wound was irrigated out with normal saline solution pulse lavage. Ligament stability and patellar tracking were checked and found to be excellent. The parapatellar arthrotomy was closed with  running #1 Vicryl suture. The subcutaneous tissue with 0 and 2-0 undyed  Vicryl suture, and the skin with running 3-0 SQ vicryl. A dressing of Xeroform,  4 x 4, dressing sponges, Webril, and Ace wrap applied. The patient  awakened, and taken to recovery room without difficulty.   Nestor LewandowskyFrank J Latarshia Jersey 05/21/2017, 11:01 AM

## 2017-05-21 NOTE — Anesthesia Procedure Notes (Signed)
Anesthesia Regional Block: Adductor canal block   Pre-Anesthetic Checklist: ,, timeout performed, Correct Patient, Correct Site, Correct Laterality, Correct Procedure, Correct Position, site marked, Risks and benefits discussed,  Surgical consent,  Pre-op evaluation,  At surgeon's request and post-op pain management  Laterality: Left  Prep: chloraprep       Needles:  Injection technique: Single-shot  Needle Type: Echogenic Needle     Needle Length: 9cm  Needle Gauge: 21     Additional Needles:   Narrative:  Start time: 05/21/2017 8:53 AM End time: 05/21/2017 8:56 AM Injection made incrementally with aspirations every 5 mL.  Performed by: Personally  Anesthesiologist: Beryle LatheBrock, Marvin Grabill E, MD  Additional Notes: No pain on injection. No increased resistance to injection. Injection made in 5cc increments. Good needle visualization. Patient tolerated the procedure well.

## 2017-05-22 LAB — BASIC METABOLIC PANEL
ANION GAP: 11 (ref 5–15)
BUN: 12 mg/dL (ref 6–20)
CALCIUM: 9.2 mg/dL (ref 8.9–10.3)
CO2: 25 mmol/L (ref 22–32)
CREATININE: 1.05 mg/dL — AB (ref 0.44–1.00)
Chloride: 104 mmol/L (ref 101–111)
GFR, EST NON AFRICAN AMERICAN: 53 mL/min — AB (ref >60)
Glucose, Bld: 113 mg/dL — ABNORMAL HIGH (ref 65–99)
Potassium: 4.2 mmol/L (ref 3.5–5.1)
SODIUM: 140 mmol/L (ref 135–145)

## 2017-05-22 LAB — CBC
HEMATOCRIT: 37.1 % (ref 36.0–46.0)
Hemoglobin: 12.1 g/dL (ref 12.0–15.0)
MCH: 30.1 pg (ref 26.0–34.0)
MCHC: 32.6 g/dL (ref 30.0–36.0)
MCV: 92.3 fL (ref 78.0–100.0)
PLATELETS: 152 10*3/uL (ref 150–400)
RBC: 4.02 MIL/uL (ref 3.87–5.11)
RDW: 12.7 % (ref 11.5–15.5)
WBC: 9.8 10*3/uL (ref 4.0–10.5)

## 2017-05-22 NOTE — Plan of Care (Signed)
  Not Progressing Education: Knowledge of General Education information will improve 05/22/2017 1118 - Not Progressing by Shaquana Buel, Arline AspMeredith P, RN Health Behavior/Discharge Planning: Ability to manage health-related needs will improve 05/22/2017 1118 - Not Progressing by Kadeidra Coryell, Arline AspMeredith P, RN Clinical Measurements: Ability to maintain clinical measurements within normal limits will improve 05/22/2017 1118 - Not Progressing by Andy Gaussunzweiler, Diona Peregoy P, RN Will remain free from infection 05/22/2017 1118 - Not Progressing by Andy Gaussunzweiler, Adi Doro P, RN Diagnostic test results will improve 05/22/2017 1118 - Not Progressing by Andy Gaussunzweiler, Takiya Belmares P, RN Respiratory complications will improve 05/22/2017 1118 - Not Progressing by Andy Gaussunzweiler, Athleen Feltner P, RN Cardiovascular complication will be avoided 05/22/2017 1118 - Not Progressing by Andy Gaussunzweiler, Alvah Lagrow P, RN Activity: Risk for activity intolerance will decrease 05/22/2017 1118 - Not Progressing by Andy Gaussunzweiler, Maxey Ransom P, RN Nutrition: Adequate nutrition will be maintained 05/22/2017 1118 - Not Progressing by Andy Gaussunzweiler, Mayme Profeta P, RN Coping: Level of anxiety will decrease 05/22/2017 1118 - Not Progressing by Andy Gaussunzweiler, Ashonti Leandro P, RN Elimination: Will not experience complications related to bowel motility 05/22/2017 1118 - Not Progressing by Andy Gaussunzweiler, Emaya Preston P, RN Will not experience complications related to urinary retention 05/22/2017 1118 - Not Progressing by Andy Gaussunzweiler, Rozalynn Buege P, RN Pain Managment: General experience of comfort will improve 05/22/2017 1118 - Not Progressing by Andy Gaussunzweiler, Merla Sawka P, RN Safety: Ability to remain free from injury will improve 05/22/2017 1118 - Not Progressing by Andy Gaussunzweiler, Dayden Viverette P, RN Skin Integrity: Risk for impaired skin integrity will decrease 05/22/2017 1118 - Not Progressing by Andy Gaussunzweiler, Lochlann Mastrangelo P, RN Education: Knowledge of the prescribed therapeutic regimen will improve 05/22/2017  1118 - Not Progressing by Andy Gaussunzweiler, Jasminemarie Sherrard P, RN Activity: Ability to avoid complications of mobility impairment will improve 05/22/2017 1118 - Not Progressing by Andy Gaussunzweiler, Marissa Lowrey P, RN Range of joint motion will improve 05/22/2017 1118 - Not Progressing by Andy Gaussunzweiler, Roselin Wiemann P, RN Clinical Measurements: Postoperative complications will be avoided or minimized 05/22/2017 1118 - Not Progressing by Andy Gaussunzweiler, Jessaca Philippi P, RN Pain Management: Pain level will decrease with appropriate interventions 05/22/2017 1118 - Not Progressing by Andy Gaussunzweiler, Emmelyn Schmale P, RN Skin Integrity: Signs of wound healing will improve 05/22/2017 1118 - Not Progressing by Aaronjames Kelsay, Arline AspMeredith P, RN

## 2017-05-22 NOTE — Care Management Note (Signed)
Case Management Note  Patient Details  Name: Carly [name] MRN: [id number] Date of Birth: April 02, 1946  Subjective/Objective:  [age] yr old female s/p left total hip arthroplasty.                  Action/Plan: Patient was pre[facility] with [facility], no changes. She has RW and 3in1. Will have support at discharge.    Expected Discharge Date:   05/22/17               Expected Discharge Plan:  Home w Home Health Services  In-House Referral:  NA  Discharge planning Services  CM Consult  Post Acute Care Choice:  Home Health Choice offered to:  Patient  DME Arranged:  N/A(has RW and 3in1) DME Agency:  NA  HH Arranged:  PT HH Agency:  [facility]  Status of Service:     If discussed at Long Length of Stay Meetings, dates discussed:    Additional Comments:  Carly [name], Carly Heckert Naomi, RN 05/22/2017, 3:54 PM

## 2017-05-22 NOTE — Progress Notes (Signed)
PATIENT ID: Carly EvertsBeverly B Purtle  MRN: [id number]  DOB/AGE:  September 03, 1945 / 72 y.o.  1 Day Post-Op Procedure(s) (LRB): TOTAL KNEE ARTHROPLASTY (Left)    PROGRESS NOTE Subjective: Patient is alert, oriented, no Nausea, no Vomiting, yes passing gas. Taking PO well. Denies SOB, Chest or Calf Pain. Using Incentive Spirometer, PAS in place. Ambulate in room WBAT, Patient reports pain as 2/10 .    Objective: Vital signs in last 24 hours: Vitals:   05/21/17 1527 05/21/17 2110 05/22/17 0232 05/22/17 0556  BP: (!) 152/71 (!) 144/117 (!) 162/66 (!) 158/71  Pulse: 70 70 64 71  Resp: 20 20 18 18   Temp: 98 F (36.7 C) 97.9 F (36.6 C) 98.8 F (37.1 C) 97.6 F (36.4 C)  TempSrc: Oral  Axillary Oral  SpO2: 99% 95% 94% 100%  Weight:      Height:          Intake/Output from previous day: I/O last 3 completed shifts: In: 700 [I.V.:700] Out: 50 [Blood:50]   Intake/Output this shift: Total I/O In: 240 [P.O.:240] Out: -    LABORATORY DATA: Recent Labs    05/22/17 0556  NA 140  K 4.2  CL 104  CO2 25  BUN 12  CREATININE 1.05*  GLUCOSE 113*  CALCIUM 9.2    Examination: Neurologically intact ABD soft Neurovascular intact Sensation intact distally Intact pulses distally Dorsiflexion/Plantar flexion intact Incision: dressing C/D/I No cellulitis present Compartment soft}  Assessment:   1 Day Post-Op Procedure(s) (LRB): TOTAL KNEE ARTHROPLASTY (Left) ADDITIONAL DIAGNOSIS: Expected Acute Blood Loss Anemia, Hypertension, Parkinsons  Plan: PT/OT WBAT, AROM and PROM  DVT Prophylaxis:  SCDx72hrs, ASA 325 mg BID x 2 weeks DISCHARGE PLAN: Home, if passes PT DISCHARGE NEEDS: HHPT, Walker and 3-in-1 comode seat     Nestor LewandowskyFrank J Talesha Ellithorpe 05/22/2017, 6:54 AM

## 2017-05-22 NOTE — Progress Notes (Signed)
Physical Therapy Treatment Patient Details Name: Carly [name] MRN: [id number] DOB: Aug 01, 1945 Today's Date: 05/22/2017    History of Present Illness Pt is a 72 y/o female s/p elective L TKA. PMH inlcudes HTN, DVT, and Parkinson's Disease.     PT Comments    Patient required min/mod A for safe ambulation with RW. Pt demonstrated difficulty following commands and sequencing tasks and required assistance to navigate environment as she was running into objects in the hallway. LE exercises limited due to difficulty sequencing. Continue to progress as tolerated.    Follow Up Recommendations  Follow surgeon's recommendation for DC plan and follow-up therapies;Supervision for mobility/OOB     Equipment Recommendations  None recommended by PT    Recommendations for Other Services       Precautions / Restrictions Precautions Precautions: Knee Precaution Comments: knee precautions reviewed with pt Restrictions Weight Bearing Restrictions: Yes LLE Weight Bearing: Weight bearing as tolerated    Mobility  Bed Mobility               General bed mobility comments: Pt in bathroom with NT upon arrival  Transfers Overall transfer level: Needs assistance Equipment used: Rolling walker (2 wheeled) Transfers: Sit to/from Stand Sit to Stand: Min assist         General transfer comment: assist to descend safely to sitting; cues for safe hand placement  Ambulation/Gait Ambulation/Gait assistance: Min assist;Mod assist Ambulation Distance (Feet): 100 Feet Assistive device: Rolling walker (2 wheeled) Gait Pattern/deviations: Step-to pattern;Step-through pattern;Decreased stance time - left;Decreased step length - right;Decreased step length - left;Drifts right/left     General Gait Details: pt with erratic gait patterns with ability for step through pattern at times; pt required assistance for safe ambulation and managing RW; pt running into objects in hallway and reported seeing  hair on the floor several times and with attempts to look at hair takes hands off of RW    Stairs            Wheelchair Mobility    Modified Rankin (Stroke Patients Only)       Balance Overall balance assessment: Needs assistance Sitting-balance support: No upper extremity supported;Feet supported Sitting balance-Leahy Scale: Good     Standing balance support: Bilateral upper extremity supported;During functional activity Standing balance-Leahy Scale: Poor Standing balance comment: Reliant on BUE support.                             Cognition Arousal/Alertness: Suspect due to medications Behavior During Therapy: Restless Overall Cognitive Status: No family/caregiver present to determine baseline cognitive functioning Area of Impairment: Memory;Following commands;Safety/judgement;Awareness;Problem solving                     Memory: Decreased short-term memory;Decreased recall of precautions Following Commands: Follows one step commands with increased time;Follows one step commands inconsistently Safety/Judgement: Decreased awareness of safety;Decreased awareness of deficits Awareness: Emergent Problem Solving: Difficulty sequencing;Requires verbal cues;Requires tactile cues;Slow processing General Comments: pt had difficulty with follow commands and completing exercises and hallucinating hair on the floor of the hallway; pt trying to see hair in the floor and pushing RW far out of reach      Exercises Total Joint Exercises Ankle Circles/Pumps: AROM;Both;10 reps Quad Sets: Limitations;Other (comment)(pt not following commands or sequencing exercise properly) Heel Slides: AROM;Left;10 reps Hip ABduction/ADduction: AROM;Left;10 reps Straight Leg Raises: AAROM;Left;5 reps(difficulty with sequencing)    General Comments  Pertinent Vitals/Pain Pain Assessment: Faces Faces Pain Scale: Hurts little more Pain Location: L knee  Pain  Descriptors / Indicators: Grimacing;Sore Pain Intervention(s): Limited activity within patient's tolerance;Monitored during session;Premedicated before session;Repositioned    Home Living                      Prior Function            PT Goals (current goals can now be found in the care plan section) Acute Rehab PT Goals Patient Stated Goal: to get better  PT Goal Formulation: With patient Time For Goal Achievement: 06/04/17 Potential to Achieve Goals: Good Progress towards PT goals: Progressing toward goals    Frequency    7X/week      PT Plan Current plan remains appropriate    Co-evaluation              AM-PAC PT "6 Clicks" Daily Activity  Outcome Measure  Difficulty turning over in bed (including adjusting bedclothes, sheets and blankets)?: A Little Difficulty moving from lying on back to sitting on the side of the bed? : Unable Difficulty sitting down on and standing up from a chair with arms (e.g., wheelchair, bedside commode, etc,.)?: Unable Help needed moving to and from a bed to chair (including a wheelchair)?: A Little Help needed walking in hospital room?: A Lot Help needed climbing 3-5 steps with a railing? : A Lot 6 Click Score: 12    End of Session Equipment Utilized During Treatment: Gait belt Activity Tolerance: Patient tolerated treatment well Patient left: in chair;with call bell/phone within reach;with family/visitor present Nurse Communication: Mobility status PT Visit Diagnosis: Unsteadiness on feet (R26.81);Other abnormalities of gait and mobility (R26.89);Pain Pain - Right/Left: Left Pain - part of body: Knee     Time: [id number] PT Time Calculation (min) (ACUTE ONLY): 24 min  Charges:  $Gait Training: 8-22 mins $Therapeutic Exercise: 8-22 mins                    G Codes:       Carly [name], PTA Pager: [phone]) [phone]     Carly [name] 05/22/2017, 11:42 AM

## 2017-05-22 NOTE — Evaluation (Signed)
Occupational Therapy Evaluation Patient Details Name: Carly EvertsBeverly B Sardinas MRN: [id number] DOB: February 12, 1946 Today's Date: 05/22/2017    History of Present Illness Pt is a 72 y/o female s/p elective L TKA. PMH inlcudes HTN, DVT, and Parkinson's Disease.    Clinical Impression   Patient is s/p L TKA surgery resulting in functional limitations due to the deficits listed below (see OT problem list). Pt currently demonstrates cognitive deficits with balance deficits that require (A) for fall prevention. No family present to determine baseline. Pt very sweet and verbalizes Parkinsons dx however seems less aware of fall risk.  Patient will benefit from skilled OT acutely to increase independence and safety with ADLS to allow discharge HHOT at minimum. Question if higher level of skilled rehabilitation required. No family present to determine (A) upon d/c home. Pt very confused and required 6 attempts/ greater than 4 minutes to dial 5 digits to call RN.     Follow Up Recommendations  Follow surgeon's recommendation for DC plan and follow-up therapies;Other (comment)(question need for rehab prior to home due to cognitive defic)    Equipment Recommendations  3 in 1 bedside commode;Other (comment)(rw)    Recommendations for Other Services PT consult     Precautions / Restrictions Precautions Precautions: Knee Precaution Comments: reeducated on need to knee extension. pt with towel roll. pt with tremors and unable to maintain leg on roll and no awareness to leg in poor positioning Restrictions Weight Bearing Restrictions: Yes LLE Weight Bearing: Weight bearing as tolerated      Mobility Bed Mobility Overal bed mobility: Needs Assistance Bed Mobility: Supine to Sit     Supine to sit: Supervision     General bed mobility comments: pt is able to progress to eob   Transfers Overall transfer level: Needs assistance Equipment used: Rolling walker (2 wheeled) Transfers: Sit to/from Stand Sit  to Stand: Min assist Stand pivot transfers: Min assist       General transfer comment: pt requires cues for safe hand placment    Balance Overall balance assessment: Needs assistance Sitting-balance support: No upper extremity supported;Feet supported Sitting balance-Leahy Scale: Good     Standing balance support: Bilateral upper extremity supported;During functional activity Standing balance-Leahy Scale: Poor Standing balance comment: Reliant on BUE support.                            ADL either performed or assessed with clinical judgement   ADL Overall ADL's : Needs assistance/impaired Eating/Feeding: Minimal assistance   Grooming: Wash/dry hands;Oral care;Wash/dry face;Minimal assistance;Standing Grooming Details (indicate cue type and reason): sink level and cues for correct use of fine motor task like cap on tooth paste. pt does at times recognize error but unable to correctly reattempt task. pt just continues the same in-effective method again and again         Upper Body Dressing : Minimal assistance   Lower Body Dressing: Minimal assistance Lower Body Dressing Details (indicate cue type and reason): due to balance deficits. pt is able to reach feet but balance deficits requires (A ) Toilet Transfer: Moderate assistance;Ambulation;RW;Regular Teacher, adult educationToilet Toilet Transfer Details (indicate cue type and reason): pt will need grab bars or 3n1 to safely transfer Toileting- Clothing Manipulation and Hygiene: Supervision/safety Toileting - Clothing Manipulation Details (indicate cue type and reason): sitting position required   Tub/Shower Transfer Details (indicate cue type and reason): unable to attempt at this time due to cognitive deficits. pt unable to sequence Functional  mobility during ADLs: Moderate assistance;Rolling walker General ADL Comments: pt demonstrates cognitive deficits affecting all Adls and safety is of concern     Vision Baseline Vision/History:  Wears glasses Wears Glasses: At all times       Perception     Praxis      Pertinent Vitals/Pain Pain Assessment: Faces Faces Pain Scale: Hurts little more Pain Location: L knee  Pain Descriptors / Indicators: Grimacing;Sore Pain Intervention(s): Monitored during session;Repositioned;RN gave pain meds during session     Hand Dominance Right   Extremity/Trunk Assessment Upper Extremity Assessment Upper Extremity Assessment: RUE deficits/detail;LUE deficits/detail RUE Deficits / Details: tremors ataxic movement. decr motor control for task like applying cap to tooth paste.  LUE Deficits / Details: tremors ataxic movement. decr motor control for task like applying cap to tooth paste.    Lower Extremity Assessment Lower Extremity Assessment: Defer to PT evaluation   Cervical / Trunk Assessment Cervical / Trunk Assessment: Kyphotic   Communication Communication Communication: No difficulties   Cognition Arousal/Alertness: Suspect due to medications Behavior During Therapy: Restless Overall Cognitive Status: No family/caregiver present to determine baseline cognitive functioning Area of Impairment: Memory;Following commands;Safety/judgement;Awareness;Problem solving                     Memory: Decreased short-term memory;Decreased recall of precautions Following Commands: Follows one step commands with increased time;Follows one step commands inconsistently Safety/Judgement: Decreased awareness of safety;Decreased awareness of deficits Awareness: Emergent Problem Solving: Difficulty sequencing;Requires verbal cues;Requires tactile cues;Slow processing General Comments: pt requires greater than 4 minutes and 6 attempts to call RN. once RN answers she is unable to remember why she was calling Rn and names another task that was not the reason for the call. pt is inconsistent on home help that will be provided. pt liable at times regarding a running work out group with  girls. pt keeps saying "they said this could happen with Parkinsons " but not giving details. pt reports telling the nursing staff she has hallunications in the night.    General Comments  RN notified of cognitive deficits and to watch patient for fall risk. pt also reports hallunications in the PM    Exercises Exercises: Total Joint Total Joint Exercises Ankle Circles/Pumps: AROM;Both;10 reps Quad Sets: (pt not following commands or sequencing exercise properly) Heel Slides: AROM;Left;10 reps Hip ABduction/ADduction: AROM;Left;10 reps Straight Leg Raises: (difficulty with sequencing)   Shoulder Instructions      Home Living Family/patient expects to be discharged to:: Private residence Living Arrangements: Spouse/significant other Available Help at Discharge: Family;Available 24 hours/day Type of Home: House Home Access: Level entry     Home Layout: Two level;Able to live on main level with bedroom/bathroom     Bathroom Shower/Tub: Producer, television/film/video: Standard     Home Equipment: Bedside commode;Walker - 2 wheels;Walker - 4 wheels   Additional Comments: reports girlfriend and sister will be caregivers. pt states spouse is in his mid 27s and cant help. pt also has a very young dog bella at home       Prior Functioning/Environment Level of Independence: Independent                 OT Problem List: Decreased strength;Decreased range of motion;Decreased activity tolerance;Impaired balance (sitting and/or standing);Decreased cognition;Decreased coordination;Decreased safety awareness;Decreased knowledge of use of DME or AE;Decreased knowledge of precautions;Impaired sensation;Pain;Increased edema      OT Treatment/Interventions: Self-care/ADL training;Therapeutic exercise;Neuromuscular education;Energy conservation;DME and/or AE instruction;Therapeutic activities;Cognitive remediation/compensation;Visual/perceptual remediation/compensation;Patient/family  education;Balance training    OT Goals(Current goals can be found in the care plan section) Acute Rehab OT Goals Patient Stated Goal: to return tow orking out with girlfriends running OT Goal Formulation: With patient Time For Goal Achievement: 06/05/17 Potential to Achieve Goals: Good  OT Frequency: Min 3X/week   Barriers to D/C: Decreased caregiver support(unknown no family present)          Co-evaluation              AM-PAC PT "6 Clicks" Daily Activity     Outcome Measure Help from another person eating meals?: A Little Help from another person taking care of personal grooming?: A Lot Help from another person toileting, which includes using toliet, bedpan, or urinal?: A Lot Help from another person bathing (including washing, rinsing, drying)?: A Lot Help from another person to put on and taking off regular upper body clothing?: A Lot Help from another person to put on and taking off regular lower body clothing?: A Lot 6 Click Score: 13   End of Session Equipment Utilized During Treatment: Gait belt;Rolling walker Nurse Communication: Mobility status;Precautions  Activity Tolerance: Patient tolerated treatment well Patient left: in chair;with call bell/phone within reach;with nursing/sitter in room  OT Visit Diagnosis: Unsteadiness on feet (R26.81);Muscle weakness (generalized) (M62.81)                Time: 1610-9604 OT Time Calculation (min): 32 min Charges:  OT General Charges $OT Visit: 1 Visit OT Evaluation $OT Eval Moderate Complexity: 1 Mod OT Treatments $Self Care/Home Management : 8-22 mins G-Codes:      Mateo Flow   OTR/L Pager: [phone] Office: [phone] .   Boone Master B 05/22/2017, 2:40 PM

## 2017-05-22 NOTE — Progress Notes (Signed)
Physical Therapy Treatment Patient Details Name: Carly [name] MRN: [id number] DOB: [birth year] Today's Date: 05/22/2017    History of Present Illness Pt is a 72 y/o female s/p elective L TKA. PMH inlcudes HTN, DVT, and Parkinson's Disease.     PT Comments    Patient is making progress toward mobility goals. Pt does continue to demonstrate cognitive deficits however with some improvement noted this session. Continue to progress as tolerated.    Follow Up Recommendations  Follow surgeon's recommendation for DC plan and follow-up therapies;Supervision for mobility/OOB     Equipment Recommendations  None recommended by PT    Recommendations for Other Services       Precautions / Restrictions Precautions Precautions: Knee Precaution Comments: knee precautions reviewed with pt Restrictions Weight Bearing Restrictions: Yes LLE Weight Bearing: Weight bearing as tolerated    Mobility  Bed Mobility Overal bed mobility: Needs Assistance Bed Mobility: Sit to Supine     Supine to sit: Supervision Sit to supine: Supervision   General bed mobility comments: supervision for safety  Transfers Overall transfer level: Needs assistance Equipment used: Rolling walker (2 wheeled) Transfers: Sit to/from Stand Sit to Stand: Min assist Stand pivot transfers: Min assist       General transfer comment: cues for safe hand placement and safe use of RW upon standing  Ambulation/Gait Ambulation/Gait assistance: Min assist;Mod assist Ambulation Distance (Feet): 200 Feet Assistive device: Rolling walker (2 wheeled) Gait Pattern/deviations: Step-through pattern;Decreased stance time - left;Decreased step length - right;Decreased step length - left;Drifts right/left;Step-to pattern     General Gait Details: cues for posture, safe use of AD, and step through pattern; pt demonstrated improved posture and more responsive to cues while ambulating; pt required mod/max cues for safe use of AD  and assist to steady and to manage RW as pt continues to drift R and L and run into Technical sales engineer Rankin (Stroke Patients Only)       Balance Overall balance assessment: Needs assistance Sitting-balance support: No upper extremity supported;Feet supported Sitting balance-Leahy Scale: Good     Standing balance support: Bilateral upper extremity supported;During functional activity Standing balance-Leahy Scale: Poor Standing balance comment: Reliant on BUE support.                             Cognition Arousal/Alertness: Suspect due to medications Behavior During Therapy: Restless Overall Cognitive Status: Impaired/Different from baseline Area of Impairment: Memory;Safety/judgement;Awareness;Problem solving;Following commands                     Memory: Decreased short-term memory;Decreased recall of precautions Following Commands: Follows one step commands with increased time;Follows one step commands inconsistently Safety/Judgement: Decreased awareness of safety;Decreased awareness of deficits Awareness: Emergent Problem Solving: Difficulty sequencing;Requires verbal cues;Requires tactile cues;Slow processing General Comments: pt requires greater than 4 minutes and 6 attempts to call RN. once RN answers she is unable to remember why she was calling Rn and names another task that was not the reason for the call. pt is inconsistent on home help that will be provided. pt liable at times regarding a running work out group with girls. pt keeps saying "they said this could happen with Parkinsons " but not giving details. pt reports telling the nursing staff she has hallunications in the night.       Exercises Total  Joint Exercises Quad Sets: AROM;Left;10 reps Straight Leg Raises: (difficulty with sequencing)    General Comments General comments (skin integrity, edema, etc.): RN notified of cognitive deficits  and to watch patient for fall risk. pt also reports hallunications in the PM      Pertinent Vitals/Pain Pain Assessment: Faces Faces Pain Scale: Hurts a little bit Pain Location: L knee  Pain Descriptors / Indicators: Sore Pain Intervention(s): Monitored during session;Repositioned    Home Living Family/patient expects to be discharged to:: Private residence Living Arrangements: Spouse/significant other Available Help at Discharge: Family;Available 24 hours/day Type of Home: House Home Access: Level entry   Home Layout: Two level;Able to live on main level with bedroom/bathroom Home Equipment: Bedside commode;Walker - 2 wheels;Walker - 4 wheels Additional Comments: reports girlfriend and sister will be caregivers. pt states spouse is in his mid 2470s and cant help. pt also has a very young dog bella at home     Prior Function Level of Independence: Independent          PT Goals (current goals can now be found in the care plan section) Acute Rehab PT Goals Patient Stated Goal: to get better  PT Goal Formulation: With patient Time For Goal Achievement: 06/04/17 Potential to Achieve Goals: Good Progress towards PT goals: Progressing toward goals    Frequency    7X/week      PT Plan Current plan remains appropriate    Co-evaluation              AM-PAC PT "6 Clicks" Daily Activity  Outcome Measure  Difficulty turning over in bed (including adjusting bedclothes, sheets and blankets)?: A Little Difficulty moving from lying on back to sitting on the side of the bed? : A Lot Difficulty sitting down on and standing up from a chair with arms (e.g., wheelchair, bedside commode, etc,.)?: Unable Help needed moving to and from a bed to chair (including a wheelchair)?: A Little Help needed walking in hospital room?: A Lot Help needed climbing 3-5 steps with a railing? : A Lot 6 Click Score: 13    End of Session Equipment Utilized During Treatment: Gait belt Activity  Tolerance: Patient tolerated treatment well Patient left: with call bell/phone within reach;with family/visitor present;in bed Nurse Communication: Mobility status PT Visit Diagnosis: Unsteadiness on feet (R26.81);Other abnormalities of gait and mobility (R26.89);Pain Pain - Right/Left: Left Pain - part of body: Knee     Time: [id number] PT Time Calculation (min) (ACUTE ONLY): 35 min  Charges:  $Gait Training: 23-37 mins                    G Codes:       Carly [name], PTA Pager: [phone]     Carly [name] 05/22/2017, 4:13 PM

## 2017-05-23 LAB — CBC
HCT: 32.7 % — ABNORMAL LOW (ref 36.0–46.0)
Hemoglobin: 10.6 g/dL — ABNORMAL LOW (ref 12.0–15.0)
MCH: 29.1 pg (ref 26.0–34.0)
MCHC: 32.4 g/dL (ref 30.0–36.0)
MCV: 89.8 fL (ref 78.0–100.0)
PLATELETS: 139 10*3/uL — AB (ref 150–400)
RBC: 3.64 MIL/uL — ABNORMAL LOW (ref 3.87–5.11)
RDW: 12.3 % (ref 11.5–15.5)
WBC: 13.9 10*3/uL — ABNORMAL HIGH (ref 4.0–10.5)

## 2017-05-23 NOTE — Progress Notes (Signed)
PATIENT ID: Carly [name]  MRN: [id number]  DOB/AGE:  70-Jul-1947 / 70 y.o.  2 Days Post-Op Procedure(s) (LRB): TOTAL KNEE ARTHROPLASTY (Left)    PROGRESS NOTE Subjective: Patient is alert, oriented, no Nausea, no Vomiting, yes passing gas. Taking PO well. Denies SOB, Chest or Calf Pain. Using Incentive Spirometer, PAS in place. Ambulate WBAT with pt walking 200 ft with therapy, Patient reports pain as 1-2/10 .    Objective: Vital signs in last 24 hours: Vitals:   05/22/17 0556 05/22/17 0930 05/22/17 1952 05/23/17 0425  BP: (!) 158/71 (!) 145/55 (!) 147/61 (!) 136/47  Pulse: 71 81 82 (!) 58  Resp: 18 18 18 17   Temp: 97.6 F (36.4 C) (!) 97.5 F (36.4 C) 98 F (36.7 C) 98.6 F (37 C)  TempSrc: Oral Oral Oral Oral  SpO2: 100% 98% 94% 97%  Weight:      Height:          Intake/Output from previous day: I/O last 3 completed shifts: In: 840 [P.O.:840] Out: -    Intake/Output this shift: No intake/output data recorded.   LABORATORY DATA: Recent Labs    05/22/17 0556 05/23/17 0522  WBC 9.8 13.9*  HGB 12.1 10.6*  HCT 37.1 32.7*  PLT 152 139*  NA 140  --   K 4.2  --   CL 104  --   CO2 25  --   BUN 12  --   CREATININE 1.05*  --   GLUCOSE 113*  --   CALCIUM 9.2  --     Examination: Neurologically intact Neurovascular intact Sensation intact distally Intact pulses distally Dorsiflexion/Plantar flexion intact Incision: dressing C/D/I and no drainage No cellulitis present Compartment soft}  Assessment:   2 Days Post-Op Procedure(s) (LRB): TOTAL KNEE ARTHROPLASTY (Left) ADDITIONAL DIAGNOSIS: Expected Acute Blood Loss Anemia, Hypertension and parkinsons  Plan: PT/OT WBAT, AROM and PROM  DVT Prophylaxis:  SCDx72hrs, ASA 325 mg BID x 2 weeks DISCHARGE PLAN: Home DISCHARGE NEEDS: HHPT, Walker and 3-in-1 comode seat     Dannielle Burnric Shanesha Bednarz 05/23/2017, 7:57 AM

## 2017-05-23 NOTE — Discharge Summary (Signed)
Patient ID: Carly [name] MRN: [id number] DOB/AGE: 70-Jan-1947 70 y.o.  Admit date: 05/21/2017 Discharge date: 05/23/2017  Admission Diagnoses:  Principal Problem:   Degenerative arthritis of left knee Active Problems:   Primary osteoarthritis of left knee   Discharge Diagnoses:  Same  Past Medical History:  Diagnosis Date  . Anemia   . Anxiety   . Arthritis   . DVT of lower extremity, bilateral (HCC)    years ago   . Hypertension   . Parkinson's disease (HCC)   . Vitamin D deficiency     Surgeries: Procedure(s): TOTAL KNEE ARTHROPLASTY on 05/21/2017   Consultants:   Discharged Condition: Improved  Hospital Course: Carly [name] is an 72 y.o. female who was admitted 05/21/2017 for operative treatment ofDegenerative arthritis of left knee. Patient has severe unremitting pain that affects sleep, daily activities, and work/hobbies. After pre-op clearance the patient was taken to the operating room on 05/21/2017 and underwent  Procedure(s): TOTAL KNEE ARTHROPLASTY.    Patient was given perioperative antibiotics:  Anti-infectives (From admission, onward)   Start     Dose/Rate Route Frequency Ordered Stop   05/21/17 0807  ceFAZolin (ANCEF) 2-4 GM/100ML-% IVPB    Comments:  [place], Jessica   : cabinet override      05/21/17 0807 05/21/17 2014   05/21/17 0800  ceFAZolin (ANCEF) IVPB 2g/100 mL premix     2 g 200 mL/hr over 30 Minutes Intravenous On call to O.R. 05/21/17 0800 05/21/17 0936       Patient was given sequential compression devices, early ambulation, and chemoprophylaxis to prevent DVT.  Patient benefited maximally from hospital stay and there were no complications.    Recent vital signs:  Patient Vitals for the past 24 hrs:  BP Temp Temp src Pulse Resp SpO2  05/23/17 0425 (!) 136/47 98.6 F (37 C) Oral (!) 58 17 97 %  05/22/17 1952 (!) 147/61 98 F (36.7 C) Oral 82 18 94 %  05/22/17 0930 (!) 145/55 (!) 97.5 F (36.4 C) Oral 81 18 98 %     Recent  laboratory studies:  Recent Labs    05/22/17 0556 05/23/17 0522  WBC 9.8 13.9*  HGB 12.1 10.6*  HCT 37.1 32.7*  PLT 152 139*  NA 140  --   K 4.2  --   CL 104  --   CO2 25  --   BUN 12  --   CREATININE 1.05*  --   GLUCOSE 113*  --   CALCIUM 9.2  --      Discharge Medications:   Allergies as of 05/23/2017      Reactions   Codeine Nausea Only, Other (See Comments)   hallucinations      Medication List    STOP taking these medications   ibuprofen 200 MG tablet Commonly known as:  ADVIL,MOTRIN     TAKE these medications   aspirin EC 325 MG tablet Take 1 tablet (325 mg total) by mouth 2 (two) times daily.   carbidopa-levodopa 25-100 MG tablet Commonly known as:  SINEMET IR TAKE ONE TABLET THREE TIMES A DAY   HYDROmorphone 2 MG tablet Commonly known as:  DILAUDID Take 1 tablet (2 mg total) by mouth every 4 (four) hours as needed for severe pain.   latanoprost 0.005 % ophthalmic solution Commonly known as:  XALATAN Place 1 drop into both eyes at bedtime.   mirtazapine 15 MG tablet Commonly known as:  REMERON Take 1 tablet (15 mg total) by mouth  at bedtime.   pramipexole 1 MG tablet Commonly known as:  MIRAPEX TAKE 1 AND 1/2 TABLETS BY MOUTH THREE TIMES A DAY What changed:    how much to take  how to take this  when to take this  additional instructions   tiZANidine 2 MG tablet Commonly known as:  ZANAFLEX Take 1 tablet (2 mg total) by mouth every 6 (six) hours as needed for muscle spasms.            Durable Medical Equipment  (From admission, onward)        Start     Ordered   05/21/17 1539  DME Walker rolling  Once    Question:  Patient needs a walker to treat with the following condition  Answer:  Status post total left knee replacement   05/21/17 1538   05/21/17 1539  DME 3 n 1  Once     05/21/17 1538   05/21/17 1539  DME Bedside commode  Once    Question:  Patient needs a bedside commode to treat with the following condition  Answer:   Status post total left knee replacement   05/21/17 1538      Diagnostic Studies: Dg Chest 2 View  Result Date: 05/15/2017 CLINICAL DATA:  Preoperative examination prior to knee replacement. History of Parkinson's disease, hypertension, bronchitis, never smoked. EXAM: CHEST  2 VIEW COMPARISON:  Report of a chest x-ray of May 16, 2005. FINDINGS: The lungs are well-expanded and clear. The heart and pulmonary vascularity are normal. The mediastinum is normal in width. There is no pleural effusion. The trachea is midline. The bony thorax exhibits no acute abnormality. IMPRESSION: There is no active cardiopulmonary disease. Electronically Signed   By: David  SwazilandJordan M.D.   On: 05/15/2017 13:25   Dg Knee 1-2 Views Left  Result Date: 05/21/2017 CLINICAL DATA:  Status post right knee replacement today. EXAM: LEFT KNEE - 1-2 VIEW COMPARISON:  None. FINDINGS: Right total knee arthroplasty is in place. The device is located. No fracture. Gas in the soft tissues from surgery noted. IMPRESSION: Status post right knee replacement.  No acute finding. Electronically Signed   By: Drusilla Kannerhomas  Dalessio M.D.   On: 05/21/2017 12:46    Disposition: Final discharge disposition not confirmed  Discharge Instructions    Call MD / Call 911   Complete by:  As directed    If you experience chest pain or shortness of breath, CALL 911 and be transported to the hospital emergency room.  If you develope a fever above 101 F, pus (white drainage) or increased drainage or redness at the wound, or calf pain, call your surgeon's office.   Constipation Prevention   Complete by:  As directed    Drink plenty of fluids.  Prune juice may be helpful.  You may use a stool softener, such as Colace (over the counter) 100 mg twice a day.  Use MiraLax (over the counter) for constipation as needed.   Diet - low sodium heart healthy   Complete by:  As directed    Driving restrictions   Complete by:  As directed    No driving for 2 weeks    Increase activity slowly as tolerated   Complete by:  As directed    Patient may shower   Complete by:  As directed    You may shower without a dressing once there is no drainage.  Do not wash over the wound.  If drainage remains, cover wound with plastic  wrap and then shower.      Follow-up Information    Gean Birchwood, MD Follow up in 2 week(s).   Specialty:  Orthopedic Surgery Contact information: [address] [place] [address] [phone]        Care, [facility] Follow up.   Specialty:  Home Health Services Why:  A representative from [facility] will contact you to arrange start date and time for your therapy. Contact information: [address] [place] [address] [phone]            Signed: Dannielle Burn 05/23/2017, 7:59 AM

## 2017-05-23 NOTE — Progress Notes (Signed)
Occupational Therapy Treatment Patient Details Name: Carly EvertsBeverly B [name] MRN: [id number] DOB: 1945-10-01 Today's Date: 05/23/2017    History of present illness Pt is a 72 y/o female s/p elective L TKA. PMH inlcudes HTN, DVT, and Parkinson's Disease.    OT comments  Pt is adequate level for d/c home with spouse (A).   Follow Up Recommendations  Follow surgeon's recommendation for DC plan and follow-up therapies;Other (comment)    Equipment Recommendations  3 in 1 bedside commode;Other (comment)    Recommendations for Other Services PT consult    Precautions / Restrictions Precautions Precautions: Knee Precaution Comments: fall precautions Restrictions LLE Weight Bearing: Weight bearing as tolerated       Mobility Bed Mobility               General bed mobility comments: in chair on arrival   Transfers Overall transfer level: Needs assistance Equipment used: Rolling walker (2 wheeled) Transfers: Sit to/from Stand Sit to Stand: Min guard              Balance                                           ADL either performed or assessed with clinical judgement   ADL Overall ADL's : Needs assistance/impaired Eating/Feeding: Independent                   Lower Body Dressing: Min guard Lower Body Dressing Details (indicate cue type and reason): pt able to don pants sock but required Mod (A) with surgerical foot shoe only due to edema present Toilet Transfer: Supervision/safety;RW       Tub/ Shower Transfer: Walk-in shower;Min guard;Rolling walker;Ambulation     General ADL Comments: spouse present and educated on safety with dog ( puppy 60 pounds). Educated on Nurse, learning disabilityshower transfer      Vision       Perception     Praxis      Cognition Arousal/Alertness: Awake/alert Behavior During Therapy: WFL for tasks assessed/performed Overall Cognitive Status: Impaired/Different from baseline Area of Impairment: Awareness                            Awareness: Emergent   General Comments: pt still with some delays in responses but much improved from evaluations        Exercises     Shoulder Instructions       General Comments      Pertinent Vitals/ Pain       Pain Assessment: Faces Faces Pain Scale: Hurts a little bit Pain Location: L knee  Pain Descriptors / Indicators: Sore Pain Intervention(s): Monitored during session;Premedicated before session;Repositioned  Home Living                                          Prior Functioning/Environment              Frequency  Min 3X/week        Progress Toward Goals  OT Goals(current goals can now be found in the care plan section)  Progress towards OT goals: Progressing toward goals  Acute Rehab OT Goals Patient Stated Goal: to get better  OT Goal Formulation: With patient Time For Goal Achievement: 06/05/17 Potential  to Achieve Goals: Good ADL Goals Pt Will Perform Grooming: with supervision;sitting Pt Will Transfer to Toilet: with supervision;ambulating;bedside commode Pt Will Perform Tub/Shower Transfer: Shower transfer;with min assist;rolling walker;ambulating;shower seat Additional ADL Goal #1: pt will complete 1 adl task with supervision (A) using RW  Plan Discharge plan remains appropriate    Co-evaluation                 AM-PAC PT "6 Clicks" Daily Activity     Outcome Measure   Help from another person eating meals?: A Little Help from another person taking care of personal grooming?: A Little Help from another person toileting, which includes using toliet, bedpan, or urinal?: A Little Help from another person bathing (including washing, rinsing, drying)?: A Little Help from another person to put on and taking off regular upper body clothing?: A Little Help from another person to put on and taking off regular lower body clothing?: A Little 6 Click Score: 18    End of Session Equipment Utilized  During Treatment: Gait belt;Rolling walker  OT Visit Diagnosis: Unsteadiness on feet (R26.81);Muscle weakness (generalized) (M62.81)   Activity Tolerance Patient tolerated treatment well   Patient Left in chair;with call bell/phone within reach;with family/visitor present   Nurse Communication Mobility status;Precautions        Time: 6962-9528 OT Time Calculation (min): 16 min  Charges: OT General Charges $OT Visit: 1 Visit OT Treatments $Self Care/Home Management : 8-22 mins   Mateo Flow   OTR/L Pager: [phone] Office: [phone] .    Boone Master B 05/23/2017, 4:01 PM

## 2017-05-23 NOTE — Progress Notes (Signed)
Physical Therapy Treatment Patient Details Name: Carly [name] MRN: [id number] DOB: 05-23-1945 Today's Date: 05/23/2017    History of Present Illness Pt is a 72 y/o female s/p elective L TKA. PMH inlcudes HTN, DVT, and Parkinson's Disease.     PT Comments    Patient is making progress toward mobility goals. Pt required min guard assist for gait and stair training and demonstrated improved cognition from previous sessions. Continue to progress as tolerated.    Follow Up Recommendations  Follow surgeon's recommendation for DC plan and follow-up therapies;Supervision for mobility/OOB     Equipment Recommendations  None recommended by PT    Recommendations for Other Services       Precautions / Restrictions Precautions Precautions: Knee Precaution Comments: knee precautions reviewed with pt Restrictions Weight Bearing Restrictions: Yes LLE Weight Bearing: Weight bearing as tolerated    Mobility  Bed Mobility               General bed mobility comments: pt OOB in chair upon arrival  Transfers Overall transfer level: Needs assistance Equipment used: Rolling walker (2 wheeled) Transfers: Sit to/from Stand Sit to Stand: Min guard         General transfer comment: min guard for safety; carry over of safe hand placement demonstrated  Ambulation/Gait Ambulation/Gait assistance: Min guard Ambulation Distance (Feet): 200 Feet Assistive device: Rolling walker (2 wheeled) Gait Pattern/deviations: Step-through pattern;Decreased stance time - left;Decreased step length - right;Decreased step length - left;Trunk flexed Gait velocity: decreased   General Gait Details: cues for posture, proximity to RW, L heel strike, and step length symmetry   Stairs Stairs: Yes   Stair Management: One rail Right;Step to pattern;Forwards Number of Stairs: 5 General stair comments: cues for sequencing and technique  Wheelchair Mobility    Modified Rankin (Stroke Patients  Only)       Balance Overall balance assessment: Needs assistance Sitting-balance support: No upper extremity supported;Feet supported Sitting balance-Leahy Scale: Good     Standing balance support: No upper extremity supported Standing balance-Leahy Scale: Fair Standing balance comment: pt is able to static stand without UE support                            Cognition Arousal/Alertness: Suspect due to medications Behavior During Therapy: WFL for tasks assessed/performed Overall Cognitive Status: Impaired/Different from baseline Area of Impairment: Memory;Safety/judgement;Problem solving                     Memory: Decreased short-term memory;Decreased recall of precautions   Safety/Judgement: Decreased awareness of safety;Decreased awareness of deficits   Problem Solving: Requires verbal cues        Exercises      General Comments        Pertinent Vitals/Pain Pain Assessment: Faces Faces Pain Scale: Hurts a little bit Pain Location: L knee  Pain Descriptors / Indicators: Sore Pain Intervention(s): Monitored during session;Repositioned    Home Living                      Prior Function            PT Goals (current goals can now be found in the care plan section) Acute Rehab PT Goals PT Goal Formulation: With patient Time For Goal Achievement: 06/04/17 Potential to Achieve Goals: Good Progress towards PT goals: Progressing toward goals    Frequency    7X/week      PT Plan  Current plan remains appropriate    Co-evaluation              AM-PAC PT "6 Clicks" Daily Activity  Outcome Measure  Difficulty turning over in bed (including adjusting bedclothes, sheets and blankets)?: A Little Difficulty moving from lying on back to sitting on the side of the bed? : A Lot Difficulty sitting down on and standing up from a chair with arms (e.g., wheelchair, bedside commode, etc,.)?: Unable Help needed moving to and from a bed  to chair (including a wheelchair)?: A Little Help needed walking in hospital room?: A Little Help needed climbing 3-5 steps with a railing? : A Little 6 Click Score: 15    End of Session Equipment Utilized During Treatment: Gait belt Activity Tolerance: Patient tolerated treatment well Patient left: with call bell/phone within reach;in chair Nurse Communication: Mobility status PT Visit Diagnosis: Unsteadiness on feet (R26.81);Other abnormalities of gait and mobility (R26.89);Pain Pain - Right/Left: Left Pain - part of body: Knee     Time: [id number] PT Time Calculation (min) (ACUTE ONLY): 35 min  Charges:  $Gait Training: 23-37 mins                    G Codes:       Erline LevineKellyn Khamiya Varin, PTA Pager: [phone]     Carolynne EdouardKellyn R Brettany Sydney 05/23/2017, 10:32 AM

## 2017-05-24 DIAGNOSIS — I10 Essential (primary) hypertension: Secondary | ICD-10-CM | POA: Diagnosis not present

## 2017-05-24 DIAGNOSIS — Z7982 Long term (current) use of aspirin: Secondary | ICD-10-CM | POA: Diagnosis not present

## 2017-05-24 DIAGNOSIS — G20 Parkinson's disease: Secondary | ICD-10-CM | POA: Diagnosis not present

## 2017-05-24 DIAGNOSIS — Z471 Aftercare following joint replacement surgery: Secondary | ICD-10-CM | POA: Diagnosis not present

## 2017-05-24 DIAGNOSIS — Z96652 Presence of left artificial knee joint: Secondary | ICD-10-CM | POA: Diagnosis not present

## 2017-05-24 DIAGNOSIS — R69 Illness, unspecified: Secondary | ICD-10-CM | POA: Diagnosis not present

## 2017-05-25 ENCOUNTER — Encounter (HOSPITAL_COMMUNITY): Payer: Self-pay | Admitting: Orthopedic Surgery

## 2017-05-25 DIAGNOSIS — Z96652 Presence of left artificial knee joint: Secondary | ICD-10-CM | POA: Diagnosis not present

## 2017-05-25 DIAGNOSIS — I10 Essential (primary) hypertension: Secondary | ICD-10-CM | POA: Diagnosis not present

## 2017-05-25 DIAGNOSIS — G20 Parkinson's disease: Secondary | ICD-10-CM | POA: Diagnosis not present

## 2017-05-25 DIAGNOSIS — Z7982 Long term (current) use of aspirin: Secondary | ICD-10-CM | POA: Diagnosis not present

## 2017-05-25 DIAGNOSIS — R69 Illness, unspecified: Secondary | ICD-10-CM | POA: Diagnosis not present

## 2017-05-25 DIAGNOSIS — Z471 Aftercare following joint replacement surgery: Secondary | ICD-10-CM | POA: Diagnosis not present

## 2017-05-28 DIAGNOSIS — G20 Parkinson's disease: Secondary | ICD-10-CM | POA: Diagnosis not present

## 2017-05-28 DIAGNOSIS — R69 Illness, unspecified: Secondary | ICD-10-CM | POA: Diagnosis not present

## 2017-05-28 DIAGNOSIS — Z471 Aftercare following joint replacement surgery: Secondary | ICD-10-CM | POA: Diagnosis not present

## 2017-05-28 DIAGNOSIS — Z7982 Long term (current) use of aspirin: Secondary | ICD-10-CM | POA: Diagnosis not present

## 2017-05-28 DIAGNOSIS — I10 Essential (primary) hypertension: Secondary | ICD-10-CM | POA: Diagnosis not present

## 2017-05-28 DIAGNOSIS — Z96652 Presence of left artificial knee joint: Secondary | ICD-10-CM | POA: Diagnosis not present

## 2017-05-29 ENCOUNTER — Encounter: Payer: Self-pay | Admitting: General Practice

## 2017-05-30 DIAGNOSIS — G20 Parkinson's disease: Secondary | ICD-10-CM | POA: Diagnosis not present

## 2017-05-30 DIAGNOSIS — Z471 Aftercare following joint replacement surgery: Secondary | ICD-10-CM | POA: Diagnosis not present

## 2017-05-30 DIAGNOSIS — I10 Essential (primary) hypertension: Secondary | ICD-10-CM | POA: Diagnosis not present

## 2017-05-30 DIAGNOSIS — Z96652 Presence of left artificial knee joint: Secondary | ICD-10-CM | POA: Diagnosis not present

## 2017-05-30 DIAGNOSIS — R69 Illness, unspecified: Secondary | ICD-10-CM | POA: Diagnosis not present

## 2017-05-30 DIAGNOSIS — Z7982 Long term (current) use of aspirin: Secondary | ICD-10-CM | POA: Diagnosis not present

## 2017-05-31 DIAGNOSIS — Z7982 Long term (current) use of aspirin: Secondary | ICD-10-CM | POA: Diagnosis not present

## 2017-05-31 DIAGNOSIS — Z471 Aftercare following joint replacement surgery: Secondary | ICD-10-CM | POA: Diagnosis not present

## 2017-05-31 DIAGNOSIS — R69 Illness, unspecified: Secondary | ICD-10-CM | POA: Diagnosis not present

## 2017-05-31 DIAGNOSIS — Z96652 Presence of left artificial knee joint: Secondary | ICD-10-CM | POA: Diagnosis not present

## 2017-05-31 DIAGNOSIS — I10 Essential (primary) hypertension: Secondary | ICD-10-CM | POA: Diagnosis not present

## 2017-05-31 DIAGNOSIS — G20 Parkinson's disease: Secondary | ICD-10-CM | POA: Diagnosis not present

## 2017-06-01 DIAGNOSIS — M1712 Unilateral primary osteoarthritis, left knee: Secondary | ICD-10-CM | POA: Diagnosis not present

## 2017-06-04 DIAGNOSIS — H25013 Cortical age-related cataract, bilateral: Secondary | ICD-10-CM | POA: Diagnosis not present

## 2017-06-04 DIAGNOSIS — H353121 Nonexudative age-related macular degeneration, left eye, early dry stage: Secondary | ICD-10-CM | POA: Diagnosis not present

## 2017-06-04 DIAGNOSIS — H401131 Primary open-angle glaucoma, bilateral, mild stage: Secondary | ICD-10-CM | POA: Diagnosis not present

## 2017-06-07 DIAGNOSIS — M25662 Stiffness of left knee, not elsewhere classified: Secondary | ICD-10-CM | POA: Diagnosis not present

## 2017-06-07 DIAGNOSIS — R2689 Other abnormalities of gait and mobility: Secondary | ICD-10-CM | POA: Diagnosis not present

## 2017-06-07 DIAGNOSIS — M25562 Pain in left knee: Secondary | ICD-10-CM | POA: Diagnosis not present

## 2017-06-07 DIAGNOSIS — Z96652 Presence of left artificial knee joint: Secondary | ICD-10-CM | POA: Diagnosis not present

## 2017-06-12 DIAGNOSIS — M25562 Pain in left knee: Secondary | ICD-10-CM | POA: Diagnosis not present

## 2017-06-12 DIAGNOSIS — M25662 Stiffness of left knee, not elsewhere classified: Secondary | ICD-10-CM | POA: Diagnosis not present

## 2017-06-12 DIAGNOSIS — R2689 Other abnormalities of gait and mobility: Secondary | ICD-10-CM | POA: Diagnosis not present

## 2017-06-12 DIAGNOSIS — Z96652 Presence of left artificial knee joint: Secondary | ICD-10-CM | POA: Diagnosis not present

## 2017-06-14 DIAGNOSIS — R2689 Other abnormalities of gait and mobility: Secondary | ICD-10-CM | POA: Diagnosis not present

## 2017-06-14 DIAGNOSIS — Z96652 Presence of left artificial knee joint: Secondary | ICD-10-CM | POA: Diagnosis not present

## 2017-06-14 DIAGNOSIS — M25562 Pain in left knee: Secondary | ICD-10-CM | POA: Diagnosis not present

## 2017-06-14 DIAGNOSIS — M25662 Stiffness of left knee, not elsewhere classified: Secondary | ICD-10-CM | POA: Diagnosis not present

## 2017-06-19 DIAGNOSIS — M25562 Pain in left knee: Secondary | ICD-10-CM | POA: Diagnosis not present

## 2017-06-19 DIAGNOSIS — M25662 Stiffness of left knee, not elsewhere classified: Secondary | ICD-10-CM | POA: Diagnosis not present

## 2017-06-19 DIAGNOSIS — R2689 Other abnormalities of gait and mobility: Secondary | ICD-10-CM | POA: Diagnosis not present

## 2017-06-19 DIAGNOSIS — Z96652 Presence of left artificial knee joint: Secondary | ICD-10-CM | POA: Diagnosis not present

## 2017-06-21 DIAGNOSIS — M25562 Pain in left knee: Secondary | ICD-10-CM | POA: Diagnosis not present

## 2017-06-21 DIAGNOSIS — M25662 Stiffness of left knee, not elsewhere classified: Secondary | ICD-10-CM | POA: Diagnosis not present

## 2017-06-21 DIAGNOSIS — R2689 Other abnormalities of gait and mobility: Secondary | ICD-10-CM | POA: Diagnosis not present

## 2017-06-21 DIAGNOSIS — Z96652 Presence of left artificial knee joint: Secondary | ICD-10-CM | POA: Diagnosis not present

## 2017-06-26 DIAGNOSIS — M25562 Pain in left knee: Secondary | ICD-10-CM | POA: Diagnosis not present

## 2017-06-26 DIAGNOSIS — Z96652 Presence of left artificial knee joint: Secondary | ICD-10-CM | POA: Diagnosis not present

## 2017-06-26 DIAGNOSIS — R2689 Other abnormalities of gait and mobility: Secondary | ICD-10-CM | POA: Diagnosis not present

## 2017-06-26 DIAGNOSIS — M25662 Stiffness of left knee, not elsewhere classified: Secondary | ICD-10-CM | POA: Diagnosis not present

## 2017-06-27 ENCOUNTER — Ambulatory Visit: Payer: Medicare HMO | Admitting: Neurology

## 2017-06-28 DIAGNOSIS — R2689 Other abnormalities of gait and mobility: Secondary | ICD-10-CM | POA: Diagnosis not present

## 2017-06-28 DIAGNOSIS — M25662 Stiffness of left knee, not elsewhere classified: Secondary | ICD-10-CM | POA: Diagnosis not present

## 2017-06-28 DIAGNOSIS — Z96652 Presence of left artificial knee joint: Secondary | ICD-10-CM | POA: Diagnosis not present

## 2017-06-28 DIAGNOSIS — M25562 Pain in left knee: Secondary | ICD-10-CM | POA: Diagnosis not present

## 2017-07-03 ENCOUNTER — Other Ambulatory Visit: Payer: Self-pay | Admitting: Neurology

## 2017-07-03 DIAGNOSIS — Z96652 Presence of left artificial knee joint: Secondary | ICD-10-CM | POA: Diagnosis not present

## 2017-07-03 DIAGNOSIS — M25562 Pain in left knee: Secondary | ICD-10-CM | POA: Diagnosis not present

## 2017-07-03 DIAGNOSIS — R2689 Other abnormalities of gait and mobility: Secondary | ICD-10-CM | POA: Diagnosis not present

## 2017-07-03 DIAGNOSIS — M25662 Stiffness of left knee, not elsewhere classified: Secondary | ICD-10-CM | POA: Diagnosis not present

## 2017-07-04 DIAGNOSIS — H401131 Primary open-angle glaucoma, bilateral, mild stage: Secondary | ICD-10-CM | POA: Diagnosis not present

## 2017-07-10 DIAGNOSIS — Z96652 Presence of left artificial knee joint: Secondary | ICD-10-CM | POA: Diagnosis not present

## 2017-07-10 DIAGNOSIS — M25662 Stiffness of left knee, not elsewhere classified: Secondary | ICD-10-CM | POA: Diagnosis not present

## 2017-07-10 DIAGNOSIS — M25562 Pain in left knee: Secondary | ICD-10-CM | POA: Diagnosis not present

## 2017-07-10 DIAGNOSIS — R2689 Other abnormalities of gait and mobility: Secondary | ICD-10-CM | POA: Diagnosis not present

## 2017-07-12 DIAGNOSIS — R2689 Other abnormalities of gait and mobility: Secondary | ICD-10-CM | POA: Diagnosis not present

## 2017-07-12 DIAGNOSIS — Z96652 Presence of left artificial knee joint: Secondary | ICD-10-CM | POA: Diagnosis not present

## 2017-07-12 DIAGNOSIS — M25662 Stiffness of left knee, not elsewhere classified: Secondary | ICD-10-CM | POA: Diagnosis not present

## 2017-07-12 DIAGNOSIS — M25562 Pain in left knee: Secondary | ICD-10-CM | POA: Diagnosis not present

## 2017-07-16 ENCOUNTER — Other Ambulatory Visit: Payer: Self-pay | Admitting: Neurology

## 2017-07-31 DIAGNOSIS — M25662 Stiffness of left knee, not elsewhere classified: Secondary | ICD-10-CM | POA: Diagnosis not present

## 2017-07-31 DIAGNOSIS — M25562 Pain in left knee: Secondary | ICD-10-CM | POA: Diagnosis not present

## 2017-07-31 DIAGNOSIS — R2689 Other abnormalities of gait and mobility: Secondary | ICD-10-CM | POA: Diagnosis not present

## 2017-07-31 DIAGNOSIS — Z96652 Presence of left artificial knee joint: Secondary | ICD-10-CM | POA: Diagnosis not present

## 2017-08-02 DIAGNOSIS — Z96652 Presence of left artificial knee joint: Secondary | ICD-10-CM | POA: Diagnosis not present

## 2017-08-02 DIAGNOSIS — R2689 Other abnormalities of gait and mobility: Secondary | ICD-10-CM | POA: Diagnosis not present

## 2017-08-02 DIAGNOSIS — M25562 Pain in left knee: Secondary | ICD-10-CM | POA: Diagnosis not present

## 2017-08-02 DIAGNOSIS — M25662 Stiffness of left knee, not elsewhere classified: Secondary | ICD-10-CM | POA: Diagnosis not present

## 2017-08-07 DIAGNOSIS — R2689 Other abnormalities of gait and mobility: Secondary | ICD-10-CM | POA: Diagnosis not present

## 2017-08-07 DIAGNOSIS — Z96652 Presence of left artificial knee joint: Secondary | ICD-10-CM | POA: Diagnosis not present

## 2017-08-07 DIAGNOSIS — M25562 Pain in left knee: Secondary | ICD-10-CM | POA: Diagnosis not present

## 2017-08-07 DIAGNOSIS — M25662 Stiffness of left knee, not elsewhere classified: Secondary | ICD-10-CM | POA: Diagnosis not present

## 2017-08-07 NOTE — Progress Notes (Signed)
Carly [name] was seen today in the movement disorders clinic for neurologic consultation at the request of Carly [name], Carly Millet, MD.  The consultation is for the evaluation of PD.  Pt is a former pt of Dr. Erling [name] and Dr. Janann [name].  I reviewed the prior records that were available to me.  The patient began to notice right hand tremor in 2009 with associated right arm stiffness.  She was initially started on selegiline and later pramipexole, which was worked up to 1 mg 3 times a day.  They did try Mirapex ER 3 mg for a short period of time, but her insurance quit paying for that.  Pt states that she loved it when she was on it once per day but it was just too expensive.  She went back to mirapex 1 mg tid.  She has trouble remembering the middle of the day mirapex but it reminds her with tremor and stiffness.   She was on Azilect, but it was also discontinued because of cost and it didn't help.  In July, 2014 carbidopa/levodopa 50/200 was added at night for cramping and RLS.  This helped, but the patient complained of some dizziness so this was decreased to carbidopa/levodopa 25/100 CR at night.  This was discontinued in May, 2015 as the patient's cramping seemed better and RLS was better.  Pt isn't sure that she even took it that long.  Pt is not currently exercising faithfully; in the past she was a marathon runner and was very competitive with sports.  She has never been to the PT program at the neuro[facility]hab center ([place]ives between here and [place]).  06/08/14 update:  Pt has a hx of PD and last visit I increased her mirapex to 1.5 mg tid.  Going up on the dosage did help.   Pt has been under a lot of stress.  Her partner of 15 years found out he has prostate CA and some friends have died.  She has noted that stress increases tremor.  She moved a piece of heavy furniture after we last met and her L sciatica flared and she just got better and then she moved something again and injured her right groin.  This has  prevented exercise.  No lightheadness/near syncope.    09/08/14 update:  Pt returns for follow up.  She is on mirapex 1.5 mg tid.  She is c/o more freezing.  She states that she can get up and turn to the right but when trying to turn to the left, she has start hesitation/freezing.  No falls.  No lightheadness/near syncope.  She is walking and riding her bike some.    11/27/14 update:  Pt is on mirapex 1.5 tid and last visit I started her on carbidopa/levodopa 25/100 tid.  She is no longer having freezing spells and states that she is doing better than she expected.  She exercises some.  She is talking and dreaming more than she was; she is not falling out of bed.  States that her best friends husband just died and she thinks that caused more of the dreams.  03/16/15 update:  The patient is following up today regarding her Parkinson's disease.  She is on pramipexole, 1-1/2 mg 3 times a day in addition to carbidopa/levodopa 25/100, one tablet 3 times per day.  She is exercising and she is switching insurance and that will pay for silver sneakers so she will be able to go back to the gym.  She denies  any falls since our last visit.  No hallucinations.  Rare visual distortions.  No lightheadedness or near syncope.  She remains on oral B12 supplements for a history of B12 deficiency.  06/15/15 update:  The patient is following up today regarding her Parkinson's disease.  She is on pramipexole, 1.5 mg 3 times a day in addition to carbidopa/levodopa 25/100, one tablet 3 times per day.  States that if she is late with her carbidopa/levodopa 25/100, she can hardly move.  No falls since last visit.  No hallucinations.  No lightheadedness or syncope.  Sleeping well.  States that her significant other had prostate CA and then had MI and then had CABG so she has been exercising a little less.  She is riding her bike.  In regards to RBD, she states that she was doing good until this week and she woke up sitting up yelling  and trying to shoot something.  11/16/15 update:  The patient followed up today regarding her Parkinson's disease.  She remains on pramipexole, 1.5 mg 3 times per day and carbidopa/levodopa 25 mg 3 times per day.  States that 2nd dose seems to wear off about an hour before the 3rd dose.  Overall, doing and feeling well.  "I'm feeling good."  She saw Carly [name] in May, and I reviewed those records.  The patient denies any falls since last visit.  She continues to exercise.  She is riding her stationary bike.  She denies any falls since last visit.  No lightheadedness.  No syncope.  No hallucinations.  04/18/16 update:  Patient follows up today.  She remains on pramipexole 1.76m 3 times per day.  She is also on carbidopa/levodopa 25/100, one tablet 3 times per day. Notes that when medication wears off she gets very tired.  Usually isn't much before next dose.  Over the holiday, she dosed her medication later so that she could have a late dinner party.  Noted more "swaying"/dyskinesia if gets anxious or nervous.   Pt denies falls.  Pt denies lightheadedness, near syncope.  No hallucinations.  Mood has been good.  07/19/16 update:  Patient seen today in follow-up.  She remains on pramipexole, 1.5 mg 3 times per day and carbidopa/levodopa 25/100, one tablet 3 times per day.  We started amantadine last visit, 100 mg 3 times per day due to dyskinesia.  The patient states that she took it for 2 days and it made her lightheaded.  She isn't sure if it helped the dyskinesia.  She read the things I gave her on DBS and talked to others who had it.    10/18/16 update:  Patient seen today in follow-up.  She is on pramipexole, 1.5 mg 3 times per day.  She denies compulsive behaviors.  She denies hallucinations.  She denies sleep attacks.  She is also on carbidopa/levodopa 25/100, one tablet 3 times per day.  She was given gocovri last visit but she never started that because it was very expensive and her insurance wouldn't pay  any of it and it was $500.   She is off of her B12 pills too.  She is walking for exercise  02/20/17 update: Patient seen today in follow-up. The records that were made available to me were reviewed.   Patient is on pramipexole, 1.5 mg 3 times per day.  She has had no sleep attacks or compulsive behaviors.  She remains also on carbidopa/levodopa 25/100, 1 tablet 3 times per day.  Pt denies falls.  Pt denies lightheadedness, near syncope.  No hallucinations.  Mood has been good.  She had knee surgery with Dr. Noemi Chapel about 8 weeks ago on the L.  She did recently have a ruptured bakers cyst on the L.  She is having some trouble walking because of it.  She is doing some exercises given to her, but has not been to physical therapy.  04/10/17 update: Patient seen today in follow-up for Parkinson's disease.  She is seen earlier than expected.  She reports that she is likely going to have knee replacement surgery.  She was referred by Dr. Noemi Chapel to another surgeon in his group.  She is having constant pain in the knee.  She is having start hesitation in the AM.   She is still on carbidopa/levodopa 25/100, 1 tablet 3 times per day.  She is also on pramipexole 1.5 mg 3 times per day.  She has had no compulsive behaviors.  She has had more dyskinesia because of anxiety.  She has had no falls, but walking has been difficult because of the knee.  She denies lightheadedness or near syncope.  No hallucinations.  Mood has been fair but she is so worried/anxious about this knee surgery.  She is not sleeping at night but that is primarily a pain issue.  08/08/17 update: Patient is seen today in follow-up for Parkinson's disease.  She is on pramipexole, 1.5 mg 3 times per day.  She is on carbidopa/levodopa 25/100, 1 tablet 3 times per day.  We did start mirtazapine since our last visit and she states that she only took it a few times prn sleep.  She is off of it now.  records have been reviewed since our last visit.  She had total  knee replacement on May 21, 2017 of the left knee.  She feels much better in terms of pain.  She is pain free.  She has some paresthesias around the left knee.  She is in PT now.  She thinks that "it messed up my parkinsons."  She is a little more off balance.  Her large dog hit her from behind outside and she fell on the ground.  She didn't get hurt and her neighbor was there immediately to help.  She is noting more dyskinesia.  PREVIOUS MEDICATIONS: Sinemet CR, Mirapex and azilect and selegeline; amantadine (lightheaded)  ALLERGIES:   Allergies  Allergen Reactions  . Codeine Nausea Only and Other (See Comments)    hallucinations    CURRENT MEDICATIONS:  Outpatient Encounter Medications as of 08/08/2017  Medication Sig  . carbidopa-levodopa (SINEMET IR) 25-100 MG tablet TAKE ONE TABLET THREE TIMES A DAY  . latanoprost (XALATAN) 0.005 % ophthalmic solution Place 1 drop into both eyes at bedtime.   . pramipexole (MIRAPEX) 1 MG tablet TAKE 1 AND 1/2 TABLET BY MOUTH THREE TIMES A DAY  . [DISCONTINUED] aspirin EC 325 MG tablet Take 1 tablet (325 mg total) by mouth 2 (two) times daily.  . [DISCONTINUED] HYDROmorphone (DILAUDID) 2 MG tablet Take 1 tablet (2 mg total) by mouth every 4 (four) hours as needed for severe pain.  . [DISCONTINUED] mirtazapine (REMERON) 15 MG tablet Take 1 tablet (15 mg total) by mouth at bedtime.  . [DISCONTINUED] tiZANidine (ZANAFLEX) 2 MG tablet Take 1 tablet (2 mg total) by mouth every 6 (six) hours as needed for muscle spasms.   No facility-administered encounter medications on file as of 08/08/2017.     PAST MEDICAL HISTORY:   Past Medical History:  Diagnosis Date  . Anemia   . Anxiety   . Arthritis   . DVT of lower extremity, bilateral ([place])    years ago   . Hypertension   . Parkinson's disease ([place])   . Vitamin D deficiency     PAST SURGICAL HISTORY:   Past Surgical History:  Procedure Laterality Date  . DILATION AND CURETTAGE OF UTERUS    . KNEE  SURGERY Left 01/2017  . REFRACTIVE SURGERY Bilateral   . TOTAL KNEE ARTHROPLASTY Left 05/21/2017   Procedure: TOTAL KNEE ARTHROPLASTY;  Surgeon: Frederik Pear, MD;  Location: [facility];  Service: Orthopedics;  Laterality: Left;    SOCIAL HISTORY:   Social History   Socioeconomic History  . Marital status: Married    Spouse name: Not on file  . Number of children: 1  . Years of education: Not on file  . Highest education level: Not on file  Occupational History  . Occupation: unempolyed   Social Needs  . Financial resource strain: Not on file  . Food insecurity:    Worry: Not on file    Inability: Not on file  . Transportation needs:    Medical: Not on file    Non-medical: Not on file  Tobacco Use  . Smoking status: Never Smoker  . Smokeless tobacco: Never Used  Substance and Sexual Activity  . Alcohol use: No  . Drug use: No  . Sexual activity: Not on file  Lifestyle  . Physical activity:    Days per week: Not on file    Minutes per session: Not on file  . Stress: Not on file  Relationships  . Social connections:    Talks on phone: Not on file    Gets together: Not on file    Attends religious service: Not on file    Active member of club or organization: Not on file    Attends meetings of clubs or organizations: Not on file    Relationship status: Not on file  . Intimate partner violence:    Fear of current or ex partner: Not on file    Emotionally abused: Not on file    Physically abused: Not on file    Forced sexual activity: Not on file  Other Topics Concern  . Not on file  Social History Narrative   Patient lives at home with partner [name].    Patient has one adult child.    Patient has 14 years of education.    Patient does not work.   Caffeine consumption is 1 cup daily     FAMILY HISTORY:   Family Status  Relation Name Status  . Father  Deceased at age 18       heart attack, smoker  . Mother  Deceased at age 22       heart   . Brother  Deceased        brain tumor  . Brother  Deceased       bladder cancer, smoker, ETOH  . Sister Hoyle Sauer Alive       stroke  . Sister Hayden Pedro       healthy  . Son  Alive       arthritis     ROS:  A complete 10 system review of systems was obtained and was unremarkable apart from what is mentioned above.  PHYSICAL EXAMINATION:    VITALS:   Vitals:   08/08/17 1050  BP: 140/70  Pulse: 62  SpO2: 97%  Weight: 171 lb (77.6 kg)  Height: '5\' 5"'$  (1.651 m)   Wt Readings from Last 3 Encounters:  08/08/17 171 lb (77.6 kg)  05/21/17 174 lb (78.9 kg)  05/15/17 174 lb 3.2 oz (79 kg)    GEN:  The patient appears stated age and is in NAD. HEENT:  Normocephalic, atraumatic.  The mucous membranes are moist. The superficial temporal arteries are without ropiness or tenderness. CV:  RRR Lungs:  CTAB Neck/HEME:  There are no carotid bruits bilaterally.  Neurological examination:  Orientation: The patient is alert and oriented x3. Cranial nerves: There is good facial symmetry. The speech is fluent and clear. Soft palate rises symmetrically and there is no tongue deviation. Hearing is intact to conversational tone. Sensation: Sensation is intact to light touch throughout Motor: Strength is 5/5 in the bilateral upper and lower extremities.   Shoulder shrug is equal and symmetric.  There is no pronator drift.  Movement examination: Tone: There is normal tone in the RUE.  There is normal tone in the LUE.  There is normal tone in the RLE.  There is normal tone in the LLE.  Abnormal movements: There is no tremor.  There is moderate axial dyskinesia Coordination:  There is minimal decremation with RAM's, with finger taps bilaterally Gait and Station: The patient has no difficulty arising out of a deep-seated chair without the use of the hands. The patient's stride length is good and has good arm swing today.      Labs  Lab Results  Component Value Date   [id number] 073 02/09/2014      ASSESSMENT/PLAN:  1.  Idiopathic Parkinson's disease, diagnosed in 2009.  She is experiencing motor fluctuations including dyskinesia and wearing off  -She will remain on Mirapex, 1.5 mg 3 times per day.  No compulsive behaviors.    -continue on the carbidopa/levodopa 25/100 tid.     -failed amantadine for dyskinesia due to lightheadedness with tid dosing but state that her insurance company wouldn't pay for gocovri.  She has new insurance now.  We talked about retrying amantadine but this time at bid.  If not helpful, we can try to reauth gocovri.  -talked about DBS given having motor fluctuations.  She wants to hold on that for right now.  -discussed exercise, community programs 2.  REM behavior disorder.  -Overall mild.  Just consists of occasional yelling out at night.  She used to be on clonazepam but is no longer.  We'll just keep an eye on this.  Doesn't want to add medication 3.  History of B12 deficiency and peripheral neuropathy, per Dr. love/[facility] records.  -Did not see significant evidence of peripheral neuropathy on examination today, but this certainly could contribute to balance issues.  She really has no significant balance problems yet, however. On oral b12 4.  Generalized anxiety/depression  -markedly improved now that knee surgery is over and she feels better.  She is off of remeron. 5.  Follow up is anticipated in the next few months, sooner should new neurologic issues arise.  Much greater than 50% of this visit was spent in counseling and coordinating care.  Total face to face time:  30 min

## 2017-08-08 ENCOUNTER — Ambulatory Visit: Payer: Medicare HMO | Admitting: Neurology

## 2017-08-08 ENCOUNTER — Encounter: Payer: Self-pay | Admitting: Neurology

## 2017-08-08 VITALS — BP 140/70 | HR 62 | Ht 65.0 in | Wt 171.0 lb

## 2017-08-08 DIAGNOSIS — G249 Dystonia, unspecified: Secondary | ICD-10-CM | POA: Diagnosis not present

## 2017-08-08 DIAGNOSIS — G20 Parkinson's disease: Secondary | ICD-10-CM | POA: Diagnosis not present

## 2017-08-08 MED ORDER — AMANTADINE HCL 100 MG PO TABS: 100.0000 mg | ORAL_TABLET | Freq: Two times a day (BID) | ORAL | 1 refills | Status: DC

## 2017-08-08 NOTE — Patient Instructions (Addendum)
1.  Start amantadine -  - 1 tablet in the AM and 1 tablet in the early evening (around dinner)   [facility] for [employer] & Movement Disorders  The [facility] team know that living well with a movement disorder extends far beyond our clinic walls. We are together with you. Our team is passionate about providing resources to you and your loved ones who are living with Parkinson's disease and movement disorders. Participate in these programs and join our community. These resources are free or low cost!   [facility] is adding:   Innovative educational programs for patients and caregivers.   Support groups for patients and caregivers living with Parkinson's disease.   Parkinson's specific exercise programs.   Custom tailored therapeutic programs that will benefit patient's living with Parkinson's disease.   We are in this together. You can help and contribute to grow these programs and resources in our community. 100% of the funds donated to the Movement Disorders Fund stays right here in our community to support patients and their caregivers.  To make a tax deductible contribution:  -ask for a [employer] for Parkinson's envelope in the office today.  - call the Office of Institutional Advancement at [phone].     Registration is OPEN!    Third Annual Parkinson's Education Symposium   To register: [email or web address]      Search:  [employer] person attending individually Questions: Contact Link Snuffer, [place]  [phone] or [email or web address]

## 2017-08-09 DIAGNOSIS — M25562 Pain in left knee: Secondary | ICD-10-CM | POA: Diagnosis not present

## 2017-08-09 DIAGNOSIS — M25662 Stiffness of left knee, not elsewhere classified: Secondary | ICD-10-CM | POA: Diagnosis not present

## 2017-08-09 DIAGNOSIS — R2689 Other abnormalities of gait and mobility: Secondary | ICD-10-CM | POA: Diagnosis not present

## 2017-08-09 DIAGNOSIS — Z96652 Presence of left artificial knee joint: Secondary | ICD-10-CM | POA: Diagnosis not present

## 2017-08-14 DIAGNOSIS — Z96652 Presence of left artificial knee joint: Secondary | ICD-10-CM | POA: Diagnosis not present

## 2017-08-14 DIAGNOSIS — M25662 Stiffness of left knee, not elsewhere classified: Secondary | ICD-10-CM | POA: Diagnosis not present

## 2017-08-14 DIAGNOSIS — R2689 Other abnormalities of gait and mobility: Secondary | ICD-10-CM | POA: Diagnosis not present

## 2017-08-14 DIAGNOSIS — M25562 Pain in left knee: Secondary | ICD-10-CM | POA: Diagnosis not present

## 2017-08-16 DIAGNOSIS — M25662 Stiffness of left knee, not elsewhere classified: Secondary | ICD-10-CM | POA: Diagnosis not present

## 2017-08-16 DIAGNOSIS — R2689 Other abnormalities of gait and mobility: Secondary | ICD-10-CM | POA: Diagnosis not present

## 2017-08-16 DIAGNOSIS — Z96652 Presence of left artificial knee joint: Secondary | ICD-10-CM | POA: Diagnosis not present

## 2017-08-16 DIAGNOSIS — M25562 Pain in left knee: Secondary | ICD-10-CM | POA: Diagnosis not present

## 2017-10-24 ENCOUNTER — Other Ambulatory Visit: Payer: Self-pay | Admitting: Neurology

## 2017-11-02 ENCOUNTER — Encounter: Payer: Self-pay | Admitting: Family Medicine

## 2017-11-02 LAB — COLOGUARD

## 2017-11-06 NOTE — Progress Notes (Addendum)
Subjective:   Carly [name] is a 72 y.o. female who presents for Medicare Annual (Subsequent) preventive examination.  Review of Systems:  No ROS.  Medicare Wellness Visit. Additional risk factors are reflected in the social history.  Cardiac Risk Factors include: advanced age (>71mn, >>34women);hypertension;family history of premature cardiovascular disease   Sleep patterns: Sleeps 7-8 hours.  Home Safety/Smoke Alarms: Feels safe in home. Smoke alarms in place.  Living environment; residence and Firearm Safety: Lives with boyfriend in 3 story home.  Seat Belt Safety/Bike Helmet: Wears seat belt.   Female:   Pap-N/A      Mammo-03/02/15. [facility] on 03/31/15, fibroadenoma. Recall 6 months. Ordered today.  Dexa scan-03/02/2015, Osteopenia. Ordered today.          CCS-cologuard ordered last year. Will complete soon.      Objective:     Vitals: BP (!) 154/84 (BP Location: Left Arm, Patient Position: Sitting, Cuff Size: Normal)   Pulse (!) 58   Temp 97.8 F (36.6 C) (Temporal)   Resp 16   Ht _0  (1.651 m)   Wt 176 lb (79.8 kg)   SpO2 98%   BMI 29.29 kg/m   Body mass index is 29.29 kg/m.  Advanced Directives 11/07/2017 05/21/2017 05/21/2017 05/15/2017 11/01/2016 01/01/2014  Does Patient Have a Medical Advance Directive? _1  Yes  Type of AParamedicof AJuarezLiving will [place]verestLiving will HSunland ParkLiving will HPleasurevilleLiving will [place]Living will Living will  Does patient want to make changes to medical advance directive? - No - Patient declined - - - -  Copy of [place] Chart? No - copy requested No - copy requested No - copy requested No - copy requested No - copy requested -    Tobacco Social History   Tobacco Use  Smoking Status Never Smoker  Smokeless Tobacco Never Used     Counseling given: Not Answered   Past  Medical History:  Diagnosis Date  . Anemia   . Anxiety   . Arthritis   . DVT of lower extremity, bilateral ([place]    years ago   . Hypertension   . Parkinson's disease ([place]   . Vitamin D deficiency    Past Surgical History:  Procedure Laterality Date  . DILATION AND CURETTAGE OF UTERUS    . KNEE SURGERY Left 01/2017  . REFRACTIVE SURGERY Bilateral   . TOTAL KNEE ARTHROPLASTY Left 05/21/2017   Procedure: TOTAL KNEE ARTHROPLASTY;  Surgeon: RFrederik Pear MD;  [facility]  Service: Orthopedics;  Laterality: Left;   Family History  Problem Relation Age of Onset  . Heart attack Father   . Heart failure Mother   . Cancer Brother        brain  . Cancer Brother        brain  . Stroke Sister    Social History   Socioeconomic History  . Marital status: Married    Spouse name: Not on file  . Number of children: 1  . Years of education: Not on file  . Highest education level: Not on file  Occupational History  . Occupation: unempolyed   Social Needs  . Financial resource strain: Not on file  . Food insecurity:    Worry: Not on file    Inability: Not on file  . Transportation needs:    Medical: Not on file    Non-medical:  Not on file  Tobacco Use  . Smoking status: Never Smoker  . Smokeless tobacco: Never Used  Substance and Sexual Activity  . Alcohol use: No  . Drug use: No  . Sexual activity: Not on file  Lifestyle  . Physical activity:    Days per week: Not on file    Minutes per session: Not on file  . Stress: Not on file  Relationships  . Social connections:    Talks on phone: Not on file    Gets together: Not on file    Attends religious service: Not on file    Active member of club or organization: Not on file    Attends meetings of clubs or organizations: Not on file    Relationship status: Not on file  Other Topics Concern  . Not on file  Social History Narrative   Patient lives at home with partner [name].    Patient has one adult child.     Patient has 14 years of education.    Patient does not work.   Caffeine consumption is 1 cup daily     Outpatient Encounter Medications as of 11/07/2017  Medication Sig  . carbidopa-levodopa (SINEMET IR) 25-100 MG tablet TAKE ONE TABLET THREE TIMES A DAY  . latanoprost (XALATAN) 0.005 % ophthalmic solution Place 1 drop into both eyes at bedtime.   . pramipexole (MIRAPEX) 1 MG tablet TAKE 1 AND 1/2 TABLET BY MOUTH THREE TIMES A DAY  . Amantadine HCl 100 MG tablet Take 1 tablet (100 mg total) by mouth 2 (two) times daily. (Patient not taking: Reported on 11/07/2017)  . Zoster Vaccine Adjuvanted [facility]) injection Inject 0.5 mLs into the muscle once for 1 dose.   No facility-administered encounter medications on file as of 11/07/2017.     Activities of Daily Living In your present state of health, do you have any difficulty performing the following activities: 11/07/2017 05/21/2017  Hearing? N Y  Vision? N N  Difficulty concentrating or making decisions? N N  Walking or climbing stairs? N Y  Dressing or bathing? N N  Doing errands, shopping? N N  Preparing Food and eating ? N -  Using the Toilet? N -  In the past six months, have you accidently leaked urine? N -  Do you have problems with loss of bowel control? N -  Managing your Medications? N -  Managing your Finances? N -  Housekeeping or managing your Housekeeping? N -  Some recent data might be hidden    Patient Care Team: Midge Minium, MD as PCP - General Tat, Eustace Quail, DO as Consulting Physician (Neurology) Elsie Saas, MD as Consulting Physician (Orthopedic Surgery) Frederik Pear, MD as Consulting Physician (Orthopedic Surgery)    Assessment:   This is a routine wellness examination for [name].  Exercise Activities and Dietary recommendations Current Exercise Habits: Structured exercise class, Type of exercise: Other - see comments(PT and water aerobics), Exercise limited by: None identified   Diet (meal  preparation, eat out, water intake, caffeinated beverages, dairy products, fruits and vegetables): Drinks water and tea.   Breakfast: egg; yogurt; cheese toast; coffee (half cup) Lunch: salad; protein/vegetables Dinner: protein/vegetables  Goals    . Weight (lb) < 150 lb (68 kg)     Lose weight by starting spin class.     . Weight (lb) < 165 lb (74.8 kg)     Lose weight by increasing walking.        Fall Risk  Fall Risk  11/07/2017 08/08/2017 04/10/2017 02/20/2017 11/01/2016  Falls in the past year? Yes Yes Yes Yes No  Comment Dog pulled over - - - -  Number falls in past yr: 1 2 or more 2 or more 1 -  Injury with Fall? No No Yes Yes -  Risk Factor Category  - High Fall Risk High Fall Risk - -  Follow up Falls prevention discussed Falls evaluation completed Falls evaluation completed Falls evaluation completed -    Depression Screen PHQ 2/9 Scores 11/07/2017 11/01/2016 08/16/2015 02/15/2015  PHQ - 2 Score 0 0 0 1     Cognitive Function MMSE - Mini Mental State Exam 11/07/2017  Orientation to time 5  Orientation to Place 5  Registration 3  Attention/ Calculation 3  Recall 3  Language- name 2 objects 2  Language- repeat 1  Language- follow 3 step command 3  Language- read & follow direction 1  Write a sentence 1  Copy design 1  Total score 28        Immunization History  Administered Date(s) Administered  . Influenza Whole 01/31/2007, 12/24/2008, 12/30/2009  . Influenza,inj,Quad PF,6+ Mos 02/09/2014, 02/15/2015  . Pneumococcal Conjugate-13 02/09/2014  . Pneumococcal Polysaccharide-23 02/15/2015    Screening Tests Health Maintenance  Topic Date Due  . COLONOSCOPY  10/28/1997  . MAMMOGRAM  03/01/2016  . INFLUENZA VACCINE  11/01/2017  . TETANUS/TDAP  04/03/2018 (Originally 10/29/1964)  . DEXA SCAN  Completed  . Hepatitis C Screening  Completed  . PNA vac Low Risk Adult  Completed       Plan:    Complete cologuard kit.  Shingles vaccine at pharmacy.   Schedule  mammogram and bone scan.   Bring a copy of your living will and/or healthcare power of attorney to your next office visit.  Continue doing brain stimulating activities (puzzles, reading, adult coloring books, staying active) to keep memory sharp.   I have personally reviewed and noted the following in the patient's chart:   . Medical and social history . Use of alcohol, tobacco or illicit drugs  . Current medications and supplements . Functional ability and status . Nutritional status . Physical activity . Advanced directives . List of other physicians . Hospitalizations, surgeries, and ER visits in previous 12 months . Vitals . Screenings to include cognitive, depression, and falls . Referrals and appointments  In addition, I have reviewed and discussed with patient certain preventive protocols, quality metrics, and best practice recommendations. A written personalized care plan for preventive services as well as general preventive health recommendations were provided to patient.     Gerilyn Nestle, RN  11/07/2017   Reviewed documentation provided by RN and agree w/ above.  [name], MD

## 2017-11-07 ENCOUNTER — Other Ambulatory Visit: Payer: Self-pay

## 2017-11-07 ENCOUNTER — Ambulatory Visit (INDEPENDENT_AMBULATORY_CARE_PROVIDER_SITE_OTHER): Payer: Medicare HMO

## 2017-11-07 ENCOUNTER — Encounter: Payer: Self-pay | Admitting: Family Medicine

## 2017-11-07 ENCOUNTER — Ambulatory Visit (INDEPENDENT_AMBULATORY_CARE_PROVIDER_SITE_OTHER): Payer: Medicare HMO | Admitting: Family Medicine

## 2017-11-07 VITALS — BP 154/84 | HR 58 | Temp 97.8°F | Resp 16 | Ht 65.0 in | Wt 176.0 lb

## 2017-11-07 VITALS — BP 148/90 | HR 58 | Temp 97.8°F | Resp 16 | Ht 65.0 in | Wt 176.0 lb

## 2017-11-07 DIAGNOSIS — Z1231 Encounter for screening mammogram for malignant neoplasm of breast: Secondary | ICD-10-CM | POA: Diagnosis not present

## 2017-11-07 DIAGNOSIS — Z23 Encounter for immunization: Secondary | ICD-10-CM | POA: Diagnosis not present

## 2017-11-07 DIAGNOSIS — E559 Vitamin D deficiency, unspecified: Secondary | ICD-10-CM | POA: Diagnosis not present

## 2017-11-07 DIAGNOSIS — I10 Essential (primary) hypertension: Secondary | ICD-10-CM | POA: Diagnosis not present

## 2017-11-07 DIAGNOSIS — Z0001 Encounter for general adult medical examination with abnormal findings: Secondary | ICD-10-CM | POA: Diagnosis not present

## 2017-11-07 DIAGNOSIS — E2839 Other primary ovarian failure: Secondary | ICD-10-CM

## 2017-11-07 DIAGNOSIS — Z1211 Encounter for screening for malignant neoplasm of colon: Secondary | ICD-10-CM

## 2017-11-07 DIAGNOSIS — Z1239 Encounter for other screening for malignant neoplasm of breast: Secondary | ICD-10-CM

## 2017-11-07 DIAGNOSIS — Z0000 Encounter for general adult medical examination without abnormal findings: Secondary | ICD-10-CM

## 2017-11-07 DIAGNOSIS — E538 Deficiency of other specified B group vitamins: Secondary | ICD-10-CM

## 2017-11-07 LAB — LIPID PANEL
CHOLESTEROL: 165 mg/dL (ref 0–200)
HDL: 69.5 mg/dL (ref >39.00)
LDL CALC: 85 mg/dL (ref 0–99)
NonHDL: 95.64
Total CHOL/HDL Ratio: 2
Triglycerides: 52 mg/dL (ref 0.0–149.0)
VLDL: 10.4 mg/dL (ref 0.0–40.0)

## 2017-11-07 LAB — HEPATIC FUNCTION PANEL
ALBUMIN: 4.3 g/dL (ref 3.5–5.2)
ALK PHOS: 87 U/L (ref 39–117)
ALT: 4 U/L (ref 0–35)
AST: 16 U/L (ref 0–37)
Bilirubin, Direct: 0.2 mg/dL (ref 0.0–0.3)
TOTAL PROTEIN: 7.1 g/dL (ref 6.0–8.3)
Total Bilirubin: 1.1 mg/dL (ref 0.2–1.2)

## 2017-11-07 LAB — BASIC METABOLIC PANEL
BUN: 15 mg/dL (ref 6–23)
CHLORIDE: 103 meq/L (ref 96–112)
CO2: 29 mEq/L (ref 19–32)
Calcium: 9.6 mg/dL (ref 8.4–10.5)
Creatinine, Ser: 1.06 mg/dL (ref 0.40–1.20)
GFR: 54.47 mL/min — AB (ref >60.00)
Glucose, Bld: 97 mg/dL (ref 70–99)
Potassium: 4.2 mEq/L (ref 3.5–5.1)
SODIUM: 139 meq/L (ref 135–145)

## 2017-11-07 LAB — CBC WITH DIFFERENTIAL/PLATELET
BASOS ABS: 0.1 10*3/uL (ref 0.0–0.1)
Basophils Relative: 1.1 % (ref 0.0–3.0)
EOS ABS: 0.1 10*3/uL (ref 0.0–0.7)
Eosinophils Relative: 1.2 % (ref 0.0–5.0)
HCT: 38.9 % (ref 36.0–46.0)
Hemoglobin: 13.1 g/dL (ref 12.0–15.0)
LYMPHS ABS: 1.4 10*3/uL (ref 0.7–4.0)
Lymphocytes Relative: 21.5 % (ref 12.0–46.0)
MCHC: 33.5 g/dL (ref 30.0–36.0)
MCV: 88.1 fl (ref 78.0–100.0)
MONO ABS: 0.3 10*3/uL (ref 0.1–1.0)
Monocytes Relative: 5.3 % (ref 3.0–12.0)
NEUTROS PCT: 70.9 % (ref 43.0–77.0)
Neutro Abs: 4.6 10*3/uL (ref 1.4–7.7)
Platelets: 152 10*3/uL (ref 150.0–400.0)
RBC: 4.42 Mil/uL (ref 3.87–5.11)
RDW: 13.8 % (ref 11.5–15.5)
WBC: 6.4 10*3/uL (ref 4.0–10.5)

## 2017-11-07 LAB — VITAMIN B12: VITAMIN B 12: 327 pg/mL (ref 211–911)

## 2017-11-07 LAB — TSH: TSH: 1.15 u[IU]/mL (ref 0.35–4.50)

## 2017-11-07 LAB — VITAMIN D 25 HYDROXY (VIT D DEFICIENCY, FRACTURES): VITD: 31.8 ng/mL (ref 30.00–100.00)

## 2017-11-07 MED ORDER — ZOSTER VAC RECOMB ADJUVANTED 50 MCG/0.5ML IM SUSR: 0.5000 mL | Freq: Once | INTRAMUSCULAR | 1 refills | Status: AC

## 2017-11-07 NOTE — Assessment & Plan Note (Signed)
Check labs and replete prn. 

## 2017-11-07 NOTE — Assessment & Plan Note (Signed)
Pt's PE WNL and unchanged from previous.  Due for mammo and DEXA (ordered) and colon cancer screen (Cologuard ordered).  UTD on immunizations.  Check labs.  Anticipatory guidance provided.

## 2017-11-07 NOTE — Patient Instructions (Addendum)
Complete cologuard kit.  Shingles vaccine at pharmacy.   Schedule mammogram and bone scan.   Bring a copy of your living will and/or healthcare power of attorney to your next office visit.  Continue doing brain stimulating activities (puzzles, reading, adult coloring books, staying active) to keep memory sharp.   Health Maintenance, Female Adopting a healthy lifestyle and getting preventive care can go a long way to promote health and wellness. Talk with your health care provider about what schedule of regular examinations is right for you. This is a good chance for you to check in with your provider about disease prevention and staying healthy. In between checkups, there are plenty of things you can do on your own. Experts have done a lot of research about which lifestyle changes and preventive measures are most likely to keep you healthy. Ask your health care provider for more information. Weight and diet Eat a healthy diet  Be sure to include plenty of vegetables, fruits, low-fat dairy products, and lean protein.  Do not eat a lot of foods high in solid fats, added sugars, or salt.  Get regular exercise. This is one of the most important things you can do for your health. ? Most adults should exercise for at least 150 minutes each week. The exercise should increase your heart rate and make you sweat (moderate-intensity exercise). ? Most adults should also do strengthening exercises at least twice a week. This is in addition to the moderate-intensity exercise.  Maintain a healthy weight  Body mass index (BMI) is a measurement that can be used to identify possible weight problems. It estimates body fat based on height and weight. Your health care provider can help determine your BMI and help you achieve or maintain a healthy weight.  For females 14 years of age and older: ? A BMI below 18.5 is considered underweight. ? A BMI of 18.5 to 24.9 is normal. ? A BMI of 25 to 29.9 is  considered overweight. ? A BMI of 30 and above is considered obese.  Watch levels of cholesterol and blood lipids  You should start having your blood tested for lipids and cholesterol at 70 years of age, then have this test every 5 years.  You may need to have your cholesterol levels checked more often if: ? Your lipid or cholesterol levels are high. ? You are older than 70 years of age. ? You are at high risk for heart disease.  Cancer screening Lung Cancer  Lung cancer screening is recommended for adults 47-55 years old who are at high risk for lung cancer because of a history of smoking.  A yearly low-dose CT scan of the lungs is recommended for people who: ? Currently smoke. ? Have quit within the past 15 years. ? Have at least a 30-pack-year history of smoking. A pack year is smoking an average of one pack of cigarettes a day for 1 year.  Yearly screening should continue until it has been 15 years since you quit.  Yearly screening should stop if you develop a health problem that would prevent you from having lung cancer treatment.  Breast Cancer  Practice breast self-awareness. This means understanding how your breasts normally appear and feel.  It also means doing regular breast self-exams. Let your health care provider know about any changes, no matter how small.  If you are in your 20s or 30s, you should have a clinical breast exam (CBE) by a health care provider every 1-3 years as  part of a regular health exam.  If you are 40 or older, have a CBE every year. Also consider having a breast X-ray (mammogram) every year.  If you have a family history of breast cancer, talk to your health care provider about genetic screening.  If you are at high risk for breast cancer, talk to your health care provider about having an MRI and a mammogram every year.  Breast cancer gene (BRCA) assessment is recommended for women who have family members with BRCA-related cancers.  BRCA-related cancers include: ? Breast. ? Ovarian. ? Tubal. ? Peritoneal cancers.  Results of the assessment will determine the need for genetic counseling and BRCA1 and BRCA2 testing.  Cervical Cancer Your health care provider may recommend that you be screened regularly for cancer of the pelvic organs (ovaries, uterus, and vagina). This screening involves a pelvic examination, including checking for microscopic changes to the surface of your cervix (Pap test). You may be encouraged to have this screening done every 3 years, beginning at age 73.  For women ages 67-65, health care providers may recommend pelvic exams and Pap testing every 3 years, or they may recommend the Pap and pelvic exam, combined with testing for human papilloma virus (HPV), every 5 years. Some types of HPV increase your risk of cervical cancer. Testing for HPV may also be done on women of any age with unclear Pap test results.  Other health care providers may not recommend any screening for nonpregnant women who are considered low risk for pelvic cancer and who do not have symptoms. Ask your health care provider if a screening pelvic exam is right for you.  If you have had past treatment for cervical cancer or a condition that could lead to cancer, you need Pap tests and screening for cancer for at least 20 years after your treatment. If Pap tests have been discontinued, your risk factors (such as having a new sexual partner) need to be reassessed to determine if screening should resume. Some women have medical problems that increase the chance of getting cervical cancer. In these cases, your health care provider may recommend more frequent screening and Pap tests.  Colorectal Cancer  This type of cancer can be detected and often prevented.  Routine colorectal cancer screening usually begins at 70 years of age and continues through 70 years of age.  Your health care provider may recommend screening at an earlier age if  you have risk factors for colon cancer.  Your health care provider may also recommend using home test kits to check for hidden blood in the stool.  A small camera at the end of a tube can be used to examine your colon directly (sigmoidoscopy or colonoscopy). This is done to check for the earliest forms of colorectal cancer.  Routine screening usually begins at age 52.  Direct examination of the colon should be repeated every 5-10 years through 70 years of age. However, you may need to be screened more often if early forms of precancerous polyps or small growths are found.  Skin Cancer  Check your skin from head to toe regularly.  Tell your health care provider about any new moles or changes in moles, especially if there is a change in a mole's shape or color.  Also tell your health care provider if you have a mole that is larger than the size of a pencil eraser.  Always use sunscreen. Apply sunscreen liberally and repeatedly throughout the day.  Protect yourself by wearing  long sleeves, pants, a wide-brimmed hat, and sunglasses whenever you are outside.  Heart disease, diabetes, and high blood pressure  High blood pressure causes heart disease and increases the risk of stroke. High blood pressure is more likely to develop in: ? People who have blood pressure in the high end of the normal range (130-139/85-89 mm Hg). ? People who are overweight or obese. ? People who are African American.  If you are 30-49 years of age, have your blood pressure checked every 3-5 years. If you are 21 years of age or older, have your blood pressure checked every year. You should have your blood pressure measured twice-once when you are at a hospital or clinic, and once when you are not at a hospital or clinic. Record the average of the two measurements. To check your blood pressure when you are not at a hospital or clinic, you can use: ? An automated blood pressure machine at a pharmacy. ? A home blood  pressure monitor.  If you are between 65 years and 83 years old, ask your health care provider if you should take aspirin to prevent strokes.  Have regular diabetes screenings. This involves taking a blood sample to check your fasting blood sugar level. ? If you are at a normal weight and have a low risk for diabetes, have this test once every three years after 70 years of age. ? If you are overweight and have a high risk for diabetes, consider being tested at a younger age or more often. Preventing infection Hepatitis B  If you have a higher risk for hepatitis B, you should be screened for this virus. You are considered at high risk for hepatitis B if: ? You were born in a country where hepatitis B is common. Ask your health care provider which countries are considered high risk. ? Your parents were born in a high-risk country, and you have not been immunized against hepatitis B (hepatitis B vaccine). ? You have HIV or AIDS. ? You use needles to inject street drugs. ? You live with someone who has hepatitis B. ? You have had sex with someone who has hepatitis B. ? You get hemodialysis treatment. ? You take certain medicines for conditions, including cancer, organ transplantation, and autoimmune conditions.  Hepatitis C  Blood testing is recommended for: ? Everyone born from [birth year] through 1963. ? Anyone with known risk factors for hepatitis C.  Sexually transmitted infections (STIs)  You should be screened for sexually transmitted infections (STIs) including gonorrhea and chlamydia if: ? You are sexually active and are younger than 70 years of age. ? You are older than 70 years of age and your health care provider tells you that you are at risk for this type of infection. ? Your sexual activity has changed since you were last screened and you are at an increased risk for chlamydia or gonorrhea. Ask your health care provider if you are at risk.  If you do not have HIV, but are at risk,  it may be recommended that you take a prescription medicine daily to prevent HIV infection. This is called pre-exposure prophylaxis (PrEP). You are considered at risk if: ? You are sexually active and do not regularly use condoms or know the HIV status of your partner(s). ? You take drugs by injection. ? You are sexually active with a partner who has HIV.  Talk with your health care provider about whether you are at high risk of being infected with HIV.  If you choose to begin PrEP, you should first be tested for HIV. You should then be tested every 3 months for as long as you are taking PrEP. Pregnancy  If you are premenopausal and you may become pregnant, ask your health care provider about preconception counseling.  If you may become pregnant, take 400 to 800 micrograms (mcg) of folic acid every day.  If you want to prevent pregnancy, talk to your health care provider about birth control (contraception). Osteoporosis and menopause  Osteoporosis is a disease in which the bones lose minerals and strength with aging. This can result in serious bone fractures. Your risk for osteoporosis can be identified using a bone density scan.  If you are 18 years of age or older, or if you are at risk for osteoporosis and fractures, ask your health care provider if you should be screened.  Ask your health care provider whether you should take a calcium or vitamin D supplement to lower your risk for osteoporosis.  Menopause may have certain physical symptoms and risks.  Hormone replacement therapy may reduce some of these symptoms and risks. Talk to your health care provider about whether hormone replacement therapy is right for you. Follow these instructions at home:  Schedule regular health, dental, and eye exams.  Stay current with your immunizations.  Do not use any tobacco products including cigarettes, chewing tobacco, or electronic cigarettes.  If you are pregnant, do not drink  alcohol.  If you are breastfeeding, limit how much and how often you drink alcohol.  Limit alcohol intake to no more than 1 drink per day for nonpregnant women. One drink equals 12 ounces of beer, 5 ounces of wine, or 1 ounces of hard liquor.  Do not use street drugs.  Do not share needles.  Ask your health care provider for help if you need support or information about quitting drugs.  Tell your health care provider if you often feel depressed.  Tell your health care provider if you have ever been abused or do not feel safe at home. This information is not intended to replace advice given to you by your health care provider. Make sure you discuss any questions you have with your health care provider. Document Released: 10/03/2010 Document Revised: 08/26/2015 Document Reviewed: 12/22/2014 Elsevier Interactive Patient Education  [employer].

## 2017-11-07 NOTE — Assessment & Plan Note (Signed)
Deteriorated.  BP is much higher today but upon arrival she had received a call that her son was possibly having a stroke.  Discussed dietary and lifestyle changes.  Will have her return in short order to recheck BP and start meds prn.  Pt expressed understanding and is in agreement w/ plan.

## 2017-11-07 NOTE — Progress Notes (Signed)
   Subjective:    Patient ID: Carly [name], female    DOB: [birth year], 72 y.o.   MRN: [id number]  HPI CPE- due for mammo, DEXA (ordered).  Due for colonoscopy- pt declines and prefers Cologuard (ordered).  UTD on immunizations.   Review of Systems Patient reports no vision/ hearing changes, adenopathy,fever, weight change,  persistant/recurrent hoarseness , swallowing issues, chest pain, palpitations, edema, persistant/recurrent cough, hemoptysis, dyspnea (rest/exertional/paroxysmal nocturnal), gastrointestinal bleeding (melena, rectal bleeding), abdominal pain, significant heartburn, bowel changes, GU symptoms (dysuria, hematuria, incontinence), Gyn symptoms (abnormal  bleeding, pain),  syncope, focal weakness, memory loss, numbness & tingling, skin/hair/nail changes, abnormal bruising or bleeding, anxiety, or depression.     Objective:   Physical Exam General Appearance:    Alert, cooperative, no distress, appears stated age  Head:    Normocephalic, without obvious abnormality, atraumatic  Eyes:    PERRL, conjunctiva/corneas clear, EOM's intact, fundi    benign, both eyes  Ears:    Normal TM's and external ear canals, both ears  Nose:   Nares normal, septum midline, mucosa normal, no drainage    or sinus tenderness  Throat:   Lips, mucosa, and tongue normal; teeth and gums normal  Neck:   Supple, symmetrical, trachea midline, no adenopathy;    Thyroid: no enlargement/tenderness/nodules  Back:     Symmetric, no curvature, ROM normal, no CVA tenderness  Lungs:     Clear to auscultation bilaterally, respirations unlabored  Chest Wall:    No tenderness or deformity   Heart:    Regular rate and rhythm, S1 and S2 normal, no murmur, rub   or gallop  Breast Exam:    Deferred to mammo  Abdomen:     Soft, non-tender, bowel sounds active all four quadrants,    no masses, no organomegaly  Genitalia:    Deferred  Rectal:    Extremities:   Extremities normal, atraumatic, no cyanosis or edema   Pulses:   2+ and symmetric all extremities  Skin:   Skin color, texture, turgor normal, no rashes or lesions  Lymph nodes:   Cervical, supraclavicular, and axillary nodes normal  Neurologic:   CNII-XII intact, normal strength, sensation and reflexes    throughout          Assessment & Plan:

## 2017-11-07 NOTE — Patient Instructions (Signed)
Follow up in 1 month to recheck BP We'll notify you of your lab results and make any changes if needed Continue to work on healthy diet and regular exercise- you can do it! Limit your salt intake- this will improve blood pressure We'll call you with your mammogram and bone density appt Call with any questions or concerns Happy [place] Birthday!!

## 2017-11-08 ENCOUNTER — Other Ambulatory Visit: Payer: Self-pay | Admitting: Family Medicine

## 2017-11-08 DIAGNOSIS — N6310 Unspecified lump in the right breast, unspecified quadrant: Secondary | ICD-10-CM

## 2017-11-13 ENCOUNTER — Ambulatory Visit
Admission: RE | Admit: 2017-11-13 | Discharge: 2017-11-13 | Disposition: A | Discharge status: Home / Self Care | Payer: Medicare HMO | Source: Ambulatory Visit | Attending: Family Medicine | Admitting: Family Medicine

## 2017-11-13 DIAGNOSIS — N6310 Unspecified lump in the right breast, unspecified quadrant: Secondary | ICD-10-CM

## 2017-11-13 DIAGNOSIS — N6311 Unspecified lump in the right breast, upper outer quadrant: Secondary | ICD-10-CM | POA: Diagnosis not present

## 2017-11-13 DIAGNOSIS — R928 Other abnormal and inconclusive findings on diagnostic imaging of breast: Secondary | ICD-10-CM | POA: Diagnosis not present

## 2017-11-13 DIAGNOSIS — N6313 Unspecified lump in the right breast, lower outer quadrant: Secondary | ICD-10-CM | POA: Diagnosis not present

## 2017-12-05 DIAGNOSIS — H5213 Myopia, bilateral: Secondary | ICD-10-CM | POA: Diagnosis not present

## 2017-12-05 DIAGNOSIS — H353121 Nonexudative age-related macular degeneration, left eye, early dry stage: Secondary | ICD-10-CM | POA: Diagnosis not present

## 2017-12-05 DIAGNOSIS — H25013 Cortical age-related cataract, bilateral: Secondary | ICD-10-CM | POA: Diagnosis not present

## 2017-12-05 DIAGNOSIS — H401131 Primary open-angle glaucoma, bilateral, mild stage: Secondary | ICD-10-CM | POA: Diagnosis not present

## 2017-12-18 NOTE — Progress Notes (Signed)
Carly [name] was seen today in the movement disorders clinic for neurologic consultation at the request of [name], [name], MD.  The consultation is for the evaluation of PD.  Pt is a former pt of [name] and [name].  I reviewed the prior records that were available to me.  The patient began to notice right hand tremor in 2009 with associated right arm stiffness.  She was initially started on selegiline and later pramipexole, which was worked up to 1 mg 3 times a day.  They did try Mirapex ER 3 mg for a short period of time, but her insurance quit paying for that.  Pt states that she loved it when she was on it once per day but it was just too expensive.  She went back to mirapex 1 mg tid.  She has trouble remembering the middle of the day mirapex but it reminds her with tremor and stiffness.   She was on Azilect, but it was also discontinued because of cost and it didn't help.  In July, 2014 carbidopa/levodopa 50/200 was added at night for cramping and RLS.  This helped, but the patient complained of some dizziness so this was decreased to carbidopa/levodopa 25/100 CR at night.  This was discontinued in May, 2015 as the patient's cramping seemed better and RLS was better.  Pt isn't sure that she even took it that long.  Pt is not currently exercising faithfully; in the past she was a marathon runner and was very competitive with sports.  She has never been to the PT program at the neuro[facility] ([place]ives between here and [place]).  06/08/14 update:  Pt has a hx of PD and last visit I increased her mirapex to 1.5 mg tid.  Going up on the dosage did help.   Pt has been under a lot of stress.  Her partner of 15 years found out he has prostate CA and some friends have died.  She has noted that stress increases tremor.  She moved a piece of heavy furniture after we last met and her L sciatica flared and she just got better and then she moved something again and injured her right groin.  This has  prevented exercise.  No lightheadness/near syncope.    09/08/14 update:  Pt returns for follow up.  She is on mirapex 1.5 mg tid.  She is c/o more freezing.  She states that she can get up and turn to the right but when trying to turn to the left, she has start hesitation/freezing.  No falls.  No lightheadness/near syncope.  She is walking and riding her bike some.    11/27/14 update:  Pt is on mirapex 1.5 tid and last visit I started her on carbidopa/levodopa 25/100 tid.  She is no longer having freezing spells and states that she is doing better than she expected.  She exercises some.  She is talking and dreaming more than she was; she is not falling out of bed.  States that her best friends husband just died and she thinks that caused more of the dreams.  03/16/15 update:  The patient is following up today regarding her Parkinson's disease.  She is on pramipexole, 1-1/2 mg 3 times a day in addition to carbidopa/levodopa 25/100, one tablet 3 times per day.  She is exercising and she is switching insurance and that will pay for silver sneakers so she will be able to go back to the gym.  She denies  any falls since our last visit.  No hallucinations.  Rare visual distortions.  No lightheadedness or near syncope.  She remains on oral B12 supplements for a history of B12 deficiency.  06/15/15 update:  The patient is following up today regarding her Parkinson's disease.  She is on pramipexole, 1.5 mg 3 times a day in addition to carbidopa/levodopa 25/100, one tablet 3 times per day.  States that if she is late with her carbidopa/levodopa 25/100, she can hardly move.  No falls since last visit.  No hallucinations.  No lightheadedness or syncope.  Sleeping well.  States that her significant other had prostate CA and then had MI and then had CABG so she has been exercising a little less.  She is riding her bike.  In regards to RBD, she states that she was doing good until this week and she woke up sitting up yelling  and trying to shoot something.  11/16/15 update:  The patient followed up today regarding her Parkinson's disease.  She remains on pramipexole, 1.5 mg 3 times per day and carbidopa/levodopa 25 mg 3 times per day.  States that 2nd dose seems to wear off about an hour before the 3rd dose.  Overall, doing and feeling well.  "I'm feeling good."  She saw Dr. Birdie Riddle in May, and I reviewed those records.  The patient denies any falls since last visit.  She continues to exercise.  She is riding her stationary bike.  She denies any falls since last visit.  No lightheadedness.  No syncope.  No hallucinations.  04/18/16 update:  Patient follows up today.  She remains on pramipexole 1.76m 3 times per day.  She is also on carbidopa/levodopa 25/100, one tablet 3 times per day. Notes that when medication wears off she gets very tired.  Usually isn't much before next dose.  Over the holiday, she dosed her medication later so that she could have a late dinner party.  Noted more "swaying"/dyskinesia if gets anxious or nervous.   Pt denies falls.  Pt denies lightheadedness, near syncope.  No hallucinations.  Mood has been good.  07/19/16 update:  Patient seen today in follow-up.  She remains on pramipexole, 1.5 mg 3 times per day and carbidopa/levodopa 25/100, one tablet 3 times per day.  We started amantadine last visit, 100 mg 3 times per day due to dyskinesia.  The patient states that she took it for 2 days and it made her lightheaded.  She isn't sure if it helped the dyskinesia.  She read the things I gave her on DBS and talked to others who had it.    10/18/16 update:  Patient seen today in follow-up.  She is on pramipexole, 1.5 mg 3 times per day.  She denies compulsive behaviors.  She denies hallucinations.  She denies sleep attacks.  She is also on carbidopa/levodopa 25/100, one tablet 3 times per day.  She was given gocovri last visit but she never started that because it was very expensive and her insurance wouldn't pay  any of it and it was $500.   She is off of her B12 pills too.  She is walking for exercise  02/20/17 update: Patient seen today in follow-up. The records that were made available to me were reviewed.   Patient is on pramipexole, 1.5 mg 3 times per day.  She has had no sleep attacks or compulsive behaviors.  She remains also on carbidopa/levodopa 25/100, 1 tablet 3 times per day.  Pt denies falls.  Pt denies lightheadedness, near syncope.  No hallucinations.  Mood has been good.  She had knee surgery with Dr. Noemi Chapel about 8 weeks ago on the L.  She did recently have a ruptured bakers cyst on the L.  She is having some trouble walking because of it.  She is doing some exercises given to her, but has not been to physical therapy.  04/10/17 update: Patient seen today in follow-up for Parkinson's disease.  She is seen earlier than expected.  She reports that she is likely going to have knee replacement surgery.  She was referred by Dr. Noemi Chapel to another surgeon in his group.  She is having constant pain in the knee.  She is having start hesitation in the AM.   She is still on carbidopa/levodopa 25/100, 1 tablet 3 times per day.  She is also on pramipexole 1.5 mg 3 times per day.  She has had no compulsive behaviors.  She has had more dyskinesia because of anxiety.  She has had no falls, but walking has been difficult because of the knee.  She denies lightheadedness or near syncope.  No hallucinations.  Mood has been fair but she is so worried/anxious about this knee surgery.  She is not sleeping at night but that is primarily a pain issue.  08/08/17 update: Patient is seen today in follow-up for Parkinson's disease.  She is on pramipexole, 1.5 mg 3 times per day.  She is on carbidopa/levodopa 25/100, 1 tablet 3 times per day.  We did start mirtazapine since our last visit and she states that she only took it a few times prn sleep.  She is off of it now.  records have been reviewed since our last visit.  She had total  knee replacement on May 21, 2017 of the left knee.  She feels much better in terms of pain.  She is pain free.  She has some paresthesias around the left knee.  She is in PT now.  She thinks that "it messed up my parkinsons."  She is a little more off balance.  Her large dog hit her from behind outside and she fell on the ground.  She didn't get hurt and her neighbor was there immediately to help.  She is noting more dyskinesia.  12/20/17 update: Patient is seen today in follow-up for Parkinson's disease.  The patient is on pramipexole, 1.5 mg 3 times per day.  She has had no compulsive behaviors.  No sleep attacks.  Memory has been good.  She is on carbidopa/levodopa 25/100, 1 tablet 3 times per day.  Records are reviewed since last visit.  She had her yearly physical exam on August 7.  She states that she thinks that her knee surgery in Feb made her PD worse.  Describes intermittent start hesitation.  No falls.   She is getting ready to have cataract surgery on the right.  PREVIOUS MEDICATIONS: Sinemet CR, Mirapex and azilect and selegeline; amantadine (lightheaded)  ALLERGIES:   Allergies  Allergen Reactions  . Codeine Nausea Only and Other (See Comments)    hallucinations    CURRENT MEDICATIONS:  Outpatient Encounter Medications as of 12/20/2017  Medication Sig  . carbidopa-levodopa (SINEMET IR) 25-100 MG tablet TAKE ONE TABLET THREE TIMES A DAY  . latanoprost (XALATAN) 0.005 % ophthalmic solution Place 1 drop into both eyes at bedtime.   . pramipexole (MIRAPEX) 1 MG tablet TAKE 1 AND 1/2 TABLET BY MOUTH THREE TIMES A DAY  . [DISCONTINUED] Amantadine HCl  100 MG tablet Take 1 tablet (100 mg total) by mouth 2 (two) times daily. (Patient not taking: Reported on 11/07/2017)   No facility-administered encounter medications on file as of 12/20/2017.     PAST MEDICAL HISTORY:   Past Medical History:  Diagnosis Date  . Anemia   . Anxiety   . Arthritis   . DVT of lower extremity, bilateral  ([place])    years ago   . Hypertension   . Parkinson's disease ([place])   . Vitamin D deficiency     PAST SURGICAL HISTORY:   Past Surgical History:  Procedure Laterality Date  . BREAST BIOPSY    . DILATION AND CURETTAGE OF UTERUS    . KNEE SURGERY Left 01/2017  . REFRACTIVE SURGERY Bilateral   . TOTAL KNEE ARTHROPLASTY Left 05/21/2017   Procedure: TOTAL KNEE ARTHROPLASTY;  Surgeon: Frederik Pear, MD;  Location: [facility];  Service: Orthopedics;  Laterality: Left;    SOCIAL HISTORY:   Social History   Socioeconomic History  . Marital status: Married    Spouse name: Not on file  . Number of children: 1  . Years of education: Not on file  . Highest education level: Not on file  Occupational History  . Occupation: unempolyed   Social Needs  . Financial resource strain: Not on file  . Food insecurity:    Worry: Not on file    Inability: Not on file  . Transportation needs:    Medical: Not on file    Non-medical: Not on file  Tobacco Use  . Smoking status: Never Smoker  . Smokeless tobacco: Never Used  Substance and Sexual Activity  . Alcohol use: No  . Drug use: No  . Sexual activity: Not on file  Lifestyle  . Physical activity:    Days per week: Not on file    Minutes per session: Not on file  . Stress: Not on file  Relationships  . Social connections:    Talks on phone: Not on file    Gets together: Not on file    Attends religious service: Not on file    Active member of club or organization: Not on file    Attends meetings of clubs or organizations: Not on file    Relationship status: Not on file  . Intimate partner violence:    Fear of current or ex partner: Not on file    Emotionally abused: Not on file    Physically abused: Not on file    Forced sexual activity: Not on file  Other Topics Concern  . Not on file  Social History Narrative   Patient lives at home with partner [name].    Patient has one adult child.    Patient has 14 years of education.     Patient does not work.   Caffeine consumption is 1 cup daily     FAMILY HISTORY:   Family Status  Relation Name Status  . Father  Deceased at age 45       heart attack, smoker  . Mother  Deceased at age 78       heart   . Brother  Deceased       brain tumor  . Brother  Deceased       bladder cancer, smoker, ETOH  . Sister Hoyle Sauer Alive       stroke  . Sister Hayden Pedro       healthy  . Son  [employer]  arthritis     ROS:  Review of Systems  Constitutional: Negative.   HENT: Negative.   Eyes: Negative.   Respiratory: Negative.   Cardiovascular: Negative.   Gastrointestinal: Negative.   Genitourinary: Negative.   Skin: Negative.     PHYSICAL EXAMINATION:    VITALS:   Vitals:   12/20/17 1033  BP: 124/76  Pulse: 60  SpO2: 93%  Weight: 176 lb (79.8 kg)  Height: '5\' 5"'$  (1.651 m)   Wt Readings from Last 3 Encounters:  12/20/17 176 lb (79.8 kg)  11/07/17 176 lb (79.8 kg)  11/07/17 176 lb (79.8 kg)    GEN:  The patient appears stated age and is in NAD. HEENT:  Normocephalic, atraumatic.  The mucous membranes are moist. The superficial temporal arteries are without ropiness or tenderness. CV:  RRR Lungs:  CTAB Neck/HEME:  There are no carotid bruits bilaterally.  Neurological examination:  Orientation: The patient is alert and oriented x3. Cranial nerves: There is good facial symmetry. The speech is fluent and clear. Soft palate rises symmetrically and there is no tongue deviation. Hearing is intact to conversational tone. Sensation: Sensation is intact to light touch throughout Motor: Strength is 5/5 in the bilateral upper and lower extremities.   Shoulder shrug is equal and symmetric.  There is no pronator drift.  Movement examination: Tone: There is normal tone in the RUE.  There is normal tone in the LUE.  There is normal tone in the RLE.  There is normal tone in the LLE.  Abnormal movements: There is no tremor.  There is mild LLE dyskinesia Coordination:   There is decremation, with any form of RAMS, including alternating supination and pronation of the forearm, hand opening and closing, finger taps, heel taps and toe taps on the left.  She does have decremation with hand opening and closing and toe taps on the right.   Gait and Station: The patient has no difficulty arising out of a deep-seated chair without the use of the hands. The patient has just mild start hesitation.  She walks well in the hallway.    Labs  Lab Results  Component Value Date   VITAMINB12 327 11/07/2017   Lab Results  Component Value Date   TSH 1.15 11/07/2017     Chemistry      Component Value Date/Time   NA 139 11/07/2017 1037   K 4.2 11/07/2017 1037   CL 103 11/07/2017 1037   CO2 29 11/07/2017 1037   BUN 15 11/07/2017 1037   CREATININE 1.06 11/07/2017 1037      Component Value Date/Time   CALCIUM 9.6 11/07/2017 1037   ALKPHOS 87 11/07/2017 1037   AST 16 11/07/2017 1037   ALT 4 11/07/2017 1037   BILITOT 1.1 11/07/2017 1037        ASSESSMENT/PLAN:  1.  Idiopathic Parkinson's disease, diagnosed in 2009.  She is experiencing motor fluctuations including dyskinesia and wearing off  -She will remain on Mirapex, 1.5 mg 3 times per day.  No compulsive behaviors.    -increase carbidopa/levodopa 25/100, 1 tablet at 7am/11am/3pm/7pm   -failed amantadine for dyskinesia due to lightheadedness   -talked about waiting after first getting up and reminding self to take big step to help start hesitation 2.  REM behavior disorder.  -Overall mild.  Just consists of occasional yelling out at night.  She used to be on clonazepam but is no longer.  We'll just keep an eye on this.  Doesn't want to add medication  3.  History of B12 deficiency and peripheral neuropathy, per Dr. love/[facility] records.  -Did not see significant evidence of peripheral neuropathy on examination today, but this certainly could contribute to balance issues.  She really has no significant balance  problems yet, however. On oral b12 4.  Follow up is anticipated in the next 4-5 months, sooner should new neurologic issues arise.

## 2017-12-19 DIAGNOSIS — H401134 Primary open-angle glaucoma, bilateral, indeterminate stage: Secondary | ICD-10-CM | POA: Diagnosis not present

## 2017-12-19 DIAGNOSIS — H353132 Nonexudative age-related macular degeneration, bilateral, intermediate dry stage: Secondary | ICD-10-CM | POA: Diagnosis not present

## 2017-12-19 DIAGNOSIS — H353111 Nonexudative age-related macular degeneration, right eye, early dry stage: Secondary | ICD-10-CM | POA: Diagnosis not present

## 2017-12-19 DIAGNOSIS — H04123 Dry eye syndrome of bilateral lacrimal glands: Secondary | ICD-10-CM | POA: Diagnosis not present

## 2017-12-19 DIAGNOSIS — H2513 Age-related nuclear cataract, bilateral: Secondary | ICD-10-CM | POA: Diagnosis not present

## 2017-12-19 DIAGNOSIS — H25013 Cortical age-related cataract, bilateral: Secondary | ICD-10-CM | POA: Diagnosis not present

## 2017-12-20 ENCOUNTER — Ambulatory Visit: Payer: Medicare HMO | Admitting: Neurology

## 2017-12-20 ENCOUNTER — Encounter: Payer: Self-pay | Admitting: Neurology

## 2017-12-20 VITALS — BP 124/76 | HR 60 | Ht 65.0 in | Wt 176.0 lb

## 2017-12-20 DIAGNOSIS — G20 Parkinson's disease: Secondary | ICD-10-CM | POA: Diagnosis not present

## 2017-12-20 DIAGNOSIS — G249 Dystonia, unspecified: Secondary | ICD-10-CM

## 2017-12-20 NOTE — Patient Instructions (Addendum)
increase carbidopa/levodopa 25/100, 1 tablet at 7am/11am/3pm/7pm.  Call me when this runs out.  Continue pramipexole, 1.0 mg, 1.5 tablets three times per day

## 2018-01-08 ENCOUNTER — Telehealth: Payer: Self-pay | Admitting: General Practice

## 2018-01-08 NOTE — Telephone Encounter (Signed)
Called pt and left a detailed message to advise that we received fax from [employer] advising that the pt has not completed her Cologuard.   Pt was asked to please complete this test and mail back to company.   Ok for [facility] to Discuss results / PCP recommendations / Schedule patient.

## 2018-01-16 ENCOUNTER — Other Ambulatory Visit: Payer: Self-pay | Admitting: Neurology

## 2018-01-16 MED ORDER — CARBIDOPA-LEVODOPA 25-100 MG PO TABS: 1.0000 | ORAL_TABLET | Freq: Four times a day (QID) | ORAL | 1 refills | Status: DC

## 2018-01-21 DIAGNOSIS — H25013 Cortical age-related cataract, bilateral: Secondary | ICD-10-CM | POA: Diagnosis not present

## 2018-01-21 DIAGNOSIS — H2513 Age-related nuclear cataract, bilateral: Secondary | ICD-10-CM | POA: Diagnosis not present

## 2018-01-21 DIAGNOSIS — H401134 Primary open-angle glaucoma, bilateral, indeterminate stage: Secondary | ICD-10-CM | POA: Diagnosis not present

## 2018-01-23 ENCOUNTER — Telehealth: Payer: Self-pay | Admitting: Neurology

## 2018-01-23 NOTE — Telephone Encounter (Signed)
If the patient cannot get through her cataract surgery because she is anxious then the surgeon should be able to prescribe something for her anxiety.

## 2018-01-23 NOTE — Telephone Encounter (Signed)
Patient called and left a vm that she was experiencing anxiety attacks. Please call her back at [phone]. Thanks!

## 2018-01-23 NOTE — Telephone Encounter (Signed)
Patient was supposed to have cataract surgery but got so anxious that she could not go through with it.  Her eye doctor instructed her to call us to get something for anxiety.  I see that she was on Klonopin in the past.  Please advise.

## 2018-01-24 NOTE — Telephone Encounter (Signed)
Please call.

## 2018-01-24 NOTE — Telephone Encounter (Signed)
Spoke with patient and made her aware.   She also states she has been having more trouble with walking. She is only taking Carbidopa Levodopa TID with an extra PRN. She is taking at 7:30/11:30/7:30. She is taking an extra between 11:30-7:30 sometimes. I advised per Dr. Don Perking last office note she was to also be taking one consistently at 3:30 pm. She will add this consistently and let me know how she does.

## 2018-01-30 ENCOUNTER — Other Ambulatory Visit: Payer: Self-pay

## 2018-01-30 ENCOUNTER — Ambulatory Visit (INDEPENDENT_AMBULATORY_CARE_PROVIDER_SITE_OTHER): Payer: Medicare HMO | Admitting: Family Medicine

## 2018-01-30 ENCOUNTER — Encounter: Payer: Self-pay | Admitting: Family Medicine

## 2018-01-30 VITALS — BP 122/80 | HR 60 | Temp 98.1°F | Resp 16 | Ht 65.0 in | Wt 177.0 lb

## 2018-01-30 DIAGNOSIS — R69 Illness, unspecified: Secondary | ICD-10-CM | POA: Diagnosis not present

## 2018-01-30 DIAGNOSIS — F411 Generalized anxiety disorder: Secondary | ICD-10-CM

## 2018-01-30 MED ORDER — ALPRAZOLAM 0.5 MG PO TABS: 0.5000 mg | ORAL_TABLET | Freq: Two times a day (BID) | ORAL | 1 refills | Status: DC | PRN

## 2018-01-30 MED ORDER — SERTRALINE HCL 25 MG PO TABS: 25.0000 mg | ORAL_TABLET | Freq: Every day | ORAL | 3 refills | Status: DC

## 2018-01-30 NOTE — Progress Notes (Signed)
   Subjective:    Patient ID: Carly [name], female    DOB: 1945/05/02, 72 y.o.   MRN: [id number]  HPI Anxiety- pt has upcoming cataract surgery.  The doctor postponed this twice bc he felt pt was 'too anxious'.  Pt has hx of lasix surgery.  Pt feels that one of the reasons she's so anxious is 'bc he keeps changing it'.  Friend feels she needs an 'every day pill'.   Review of Systems For ROS see HPI     Objective:   Physical Exam  Constitutional: She is oriented to person, place, and time. She appears well-developed and well-nourished. No distress.  HENT:  Head: Normocephalic and atraumatic.  Neurological: She is alert and oriented to person, place, and time.  Uncontrolled Parkinson's movements  Psychiatric: Her behavior is normal. Thought content normal.  anxious  Vitals reviewed.         Assessment & Plan:

## 2018-01-30 NOTE — Assessment & Plan Note (Signed)
Deteriorated.  Pt was previously on Celexa but this was years ago.  We have revisited the idea of medication multiple times over the year but pt always felt she had things under control.  Recently her eye doctor and her friend feel that her anxiety is worsening.  Will start low dose SSRI and add low dose Alprazolam to use as needed.

## 2018-01-30 NOTE — Patient Instructions (Signed)
Follow up in 1 month to recheck anxiety START the Sertraline once daily USE the Alprazolam (1/2 tab or whole tab) as needed for those panicked moments Call with any questions or concerns Hang in there!  Everything you're feeling is NORMAL!

## 2018-02-22 ENCOUNTER — Other Ambulatory Visit: Payer: Self-pay | Admitting: Neurology

## 2018-03-06 ENCOUNTER — Ambulatory Visit: Payer: Medicare HMO | Admitting: Family Medicine

## 2018-04-11 DIAGNOSIS — H2513 Age-related nuclear cataract, bilateral: Secondary | ICD-10-CM | POA: Diagnosis not present

## 2018-04-11 DIAGNOSIS — H401134 Primary open-angle glaucoma, bilateral, indeterminate stage: Secondary | ICD-10-CM | POA: Diagnosis not present

## 2018-04-11 DIAGNOSIS — H353122 Nonexudative age-related macular degeneration, left eye, intermediate dry stage: Secondary | ICD-10-CM | POA: Diagnosis not present

## 2018-04-11 DIAGNOSIS — H353111 Nonexudative age-related macular degeneration, right eye, early dry stage: Secondary | ICD-10-CM | POA: Diagnosis not present

## 2018-04-11 DIAGNOSIS — H25013 Cortical age-related cataract, bilateral: Secondary | ICD-10-CM | POA: Diagnosis not present

## 2018-05-21 DIAGNOSIS — H4010X4 Unspecified open-angle glaucoma, indeterminate stage: Secondary | ICD-10-CM | POA: Diagnosis not present

## 2018-05-21 DIAGNOSIS — H25011 Cortical age-related cataract, right eye: Secondary | ICD-10-CM | POA: Diagnosis not present

## 2018-05-21 DIAGNOSIS — Z79899 Other long term (current) drug therapy: Secondary | ICD-10-CM | POA: Diagnosis not present

## 2018-05-21 DIAGNOSIS — H2511 Age-related nuclear cataract, right eye: Secondary | ICD-10-CM | POA: Diagnosis not present

## 2018-05-21 DIAGNOSIS — H353121 Nonexudative age-related macular degeneration, left eye, early dry stage: Secondary | ICD-10-CM | POA: Diagnosis not present

## 2018-05-21 DIAGNOSIS — Z885 Allergy status to narcotic agent status: Secondary | ICD-10-CM | POA: Diagnosis not present

## 2018-05-21 DIAGNOSIS — H25013 Cortical age-related cataract, bilateral: Secondary | ICD-10-CM | POA: Diagnosis not present

## 2018-05-21 DIAGNOSIS — H401131 Primary open-angle glaucoma, bilateral, mild stage: Secondary | ICD-10-CM | POA: Diagnosis not present

## 2018-05-21 DIAGNOSIS — G20 Parkinson's disease: Secondary | ICD-10-CM | POA: Diagnosis not present

## 2018-05-21 DIAGNOSIS — Z96652 Presence of left artificial knee joint: Secondary | ICD-10-CM | POA: Diagnosis not present

## 2018-05-21 DIAGNOSIS — I10 Essential (primary) hypertension: Secondary | ICD-10-CM | POA: Diagnosis not present

## 2018-05-21 DIAGNOSIS — H401114 Primary open-angle glaucoma, right eye, indeterminate stage: Secondary | ICD-10-CM | POA: Diagnosis not present

## 2018-05-21 HISTORY — PX: EYE SURGERY: SHX253

## 2018-05-23 NOTE — Progress Notes (Signed)
Carly [name] was seen today in the movement disorders clinic for neurologic consultation at the request of [name], [name], MD.  The consultation is for the evaluation of PD.  Pt is a former pt of [name] and [name].  I reviewed the prior records that were available to me.  The patient began to notice right hand tremor in 2009 with associated right arm stiffness.  She was initially started on selegiline and later pramipexole, which was worked up to 1 mg 3 times a day.  They did try Mirapex ER 3 mg for a short period of time, but her insurance quit paying for that.  Pt states that she loved it when she was on it once per day but it was just too expensive.  She went back to mirapex 1 mg tid.  She has trouble remembering the middle of the day mirapex but it reminds her with tremor and stiffness.   She was on Azilect, but it was also discontinued because of cost and it didn't help.  In July, 2014 carbidopa/levodopa 50/200 was added at night for cramping and RLS.  This helped, but the patient complained of some dizziness so this was decreased to carbidopa/levodopa 25/100 CR at night.  This was discontinued in May, 2015 as the patient's cramping seemed better and RLS was better.  Pt isn't sure that she even took it that long.  Pt is not currently exercising faithfully; in the past she was a marathon runner and was very competitive with sports.  She has never been to the PT program at the [facility] (lives between here and [place]).  06/08/14 update:  Pt has a hx of PD and last visit I increased her mirapex to 1.5 mg tid.  Going up on the dosage did help.   Pt has been under a lot of stress.  Her partner of 15 years found out he has prostate CA and some friends have died.  She has noted that stress increases tremor.  She moved a piece of heavy furniture after we last met and her L sciatica flared and she just got better and then she moved something again and injured her right groin.  This has  prevented exercise.  No lightheadness/near syncope.    09/08/14 update:  Pt returns for follow up.  She is on mirapex 1.5 mg tid.  She is c/o more freezing.  She states that she can get up and turn to the right but when trying to turn to the left, she has start hesitation/freezing.  No falls.  No lightheadness/near syncope.  She is walking and riding her bike some.    11/27/14 update:  Pt is on mirapex 1.5 tid and last visit I started her on carbidopa/levodopa 25/100 tid.  She is no longer having freezing spells and states that she is doing better than she expected.  She exercises some.  She is talking and dreaming more than she was; she is not falling out of bed.  States that her best friends husband just died and she thinks that caused more of the dreams.  03/16/15 update:  The patient is following up today regarding her Parkinson's disease.  She is on pramipexole, 1-1/2 mg 3 times a day in addition to carbidopa/levodopa 25/100, one tablet 3 times per day.  She is exercising and she is switching insurance and that will pay for silver sneakers so she will be able to go back to the gym.  She denies  any falls since our last visit.  No hallucinations.  Rare visual distortions.  No lightheadedness or near syncope.  She remains on oral B12 supplements for a history of B12 deficiency.  06/15/15 update:  The patient is following up today regarding her Parkinson's disease.  She is on pramipexole, 1.5 mg 3 times a day in addition to carbidopa/levodopa 25/100, one tablet 3 times per day.  States that if she is late with her carbidopa/levodopa 25/100, she can hardly move.  No falls since last visit.  No hallucinations.  No lightheadedness or syncope.  Sleeping well.  States that her significant other had prostate CA and then had MI and then had CABG so she has been exercising a little less.  She is riding her bike.  In regards to RBD, she states that she was doing good until this week and she woke up sitting up yelling  and trying to shoot something.  11/16/15 update:  The patient followed up today regarding her Parkinson's disease.  She remains on pramipexole, 1.5 mg 3 times per day and carbidopa/levodopa 25 mg 3 times per day.  States that 2nd dose seems to wear off about an hour before the 3rd dose.  Overall, doing and feeling well.  "I'm feeling good."  She saw Dr. Birdie Riddle in May, and I reviewed those records.  The patient denies any falls since last visit.  She continues to exercise.  She is riding her stationary bike.  She denies any falls since last visit.  No lightheadedness.  No syncope.  No hallucinations.  04/18/16 update:  Patient follows up today.  She remains on pramipexole 1.76m 3 times per day.  She is also on carbidopa/levodopa 25/100, one tablet 3 times per day. Notes that when medication wears off she gets very tired.  Usually isn't much before next dose.  Over the holiday, she dosed her medication later so that she could have a late dinner party.  Noted more "swaying"/dyskinesia if gets anxious or nervous.   Pt denies falls.  Pt denies lightheadedness, near syncope.  No hallucinations.  Mood has been good.  07/19/16 update:  Patient seen today in follow-up.  She remains on pramipexole, 1.5 mg 3 times per day and carbidopa/levodopa 25/100, one tablet 3 times per day.  We started amantadine last visit, 100 mg 3 times per day due to dyskinesia.  The patient states that she took it for 2 days and it made her lightheaded.  She isn't sure if it helped the dyskinesia.  She read the things I gave her on DBS and talked to others who had it.    10/18/16 update:  Patient seen today in follow-up.  She is on pramipexole, 1.5 mg 3 times per day.  She denies compulsive behaviors.  She denies hallucinations.  She denies sleep attacks.  She is also on carbidopa/levodopa 25/100, one tablet 3 times per day.  She was given gocovri last visit but she never started that because it was very expensive and her insurance wouldn't pay  any of it and it was $500.   She is off of her B12 pills too.  She is walking for exercise  02/20/17 update: Patient seen today in follow-up. The records that were made available to me were reviewed.   Patient is on pramipexole, 1.5 mg 3 times per day.  She has had no sleep attacks or compulsive behaviors.  She remains also on carbidopa/levodopa 25/100, 1 tablet 3 times per day.  Pt denies falls.  Pt denies lightheadedness, near syncope.  No hallucinations.  Mood has been good.  She had knee surgery with Dr. Noemi Chapel about 8 weeks ago on the L.  She did recently have a ruptured bakers cyst on the L.  She is having some trouble walking because of it.  She is doing some exercises given to her, but has not been to physical therapy.  04/10/17 update: Patient seen today in follow-up for Parkinson's disease.  She is seen earlier than expected.  She reports that she is likely going to have knee replacement surgery.  She was referred by Dr. Noemi Chapel to another surgeon in his group.  She is having constant pain in the knee.  She is having start hesitation in the AM.   She is still on carbidopa/levodopa 25/100, 1 tablet 3 times per day.  She is also on pramipexole 1.5 mg 3 times per day.  She has had no compulsive behaviors.  She has had more dyskinesia because of anxiety.  She has had no falls, but walking has been difficult because of the knee.  She denies lightheadedness or near syncope.  No hallucinations.  Mood has been fair but she is so worried/anxious about this knee surgery.  She is not sleeping at night but that is primarily a pain issue.  08/08/17 update: Patient is seen today in follow-up for Parkinson's disease.  She is on pramipexole, 1.5 mg 3 times per day.  She is on carbidopa/levodopa 25/100, 1 tablet 3 times per day.  We did start mirtazapine since our last visit and she states that she only took it a few times prn sleep.  She is off of it now.  records have been reviewed since our last visit.  She had total  knee replacement on May 21, 2017 of the left knee.  She feels much better in terms of pain.  She is pain free.  She has some paresthesias around the left knee.  She is in PT now.  She thinks that "it messed up my parkinsons."  She is a little more off balance.  Her large dog hit her from behind outside and she fell on the ground.  She didn't get hurt and her neighbor was there immediately to help.  She is noting more dyskinesia.  12/20/17 update: Patient is seen today in follow-up for Parkinson's disease.  The patient is on pramipexole, 1.5 mg 3 times per day.  She has had no compulsive behaviors.  No sleep attacks.  Memory has been good.  She is on carbidopa/levodopa 25/100, 1 tablet 3 times per day.  Records are reviewed since last visit.  She had her yearly physical exam on August 7.  She states that she thinks that her knee surgery in Feb made her PD worse.  Describes intermittent start hesitation.  No falls.   She is getting ready to have cataract surgery on the right.  05/24/18 update:  Pt seen in f/u for PD.  She is on pramipexole, 1.5 mg tid and last visit, I increased her carbidopa/levodopa 25/100 to qid dosing.  She reports that "I have been up and down."  Pt denies falls but has broken toes dragging her feet or catching them on doors or tripping over toys on the floor.  Pt denies lightheadedness, near syncope.  No hallucinations.  Mood has been good.  The records that were made available to me were reviewed.  Dr. Birdie Riddle stated the patient on sertraline and prn xanax.  Pt reports she is  not taking the xanax or sertraline.  Had her cataract surgery last week.  States that her eye surgeon was worried not about anxiety (which is what I was told on the phone) but about dyskinesia and he doesn't want to do the other one until dyskinesia under better control.  Worse when worried.  Asks about DBS.    PREVIOUS MEDICATIONS: Sinemet CR, Mirapex and azilect and selegeline; amantadine  (lightheaded)  ALLERGIES:   Allergies  Allergen Reactions  . Codeine Nausea Only and Other (See Comments)    hallucinations    CURRENT MEDICATIONS:  Outpatient Encounter Medications as of 05/24/2018  Medication Sig  . ALPRAZolam (XANAX) 0.5 MG tablet Take 1 tablet (0.5 mg total) by mouth 2 (two) times daily as needed for anxiety.  . carbidopa-levodopa (SINEMET IR) 25-100 MG tablet Take 1 tablet by mouth 4 (four) times daily.  Marland Kitchen latanoprost (XALATAN) 0.005 % ophthalmic solution Place 1 drop into both eyes at bedtime.   . pramipexole (MIRAPEX) 1 MG tablet TAKE 1 AND 1/2 TABLET BY MOUTH THREE TIMES A DAY  . sertraline (ZOLOFT) 25 MG tablet Take 1 tablet (25 mg total) by mouth daily.   No facility-administered encounter medications on file as of 05/24/2018.     PAST MEDICAL HISTORY:   Past Medical History:  Diagnosis Date  . Anemia   . Anxiety   . Arthritis   . DVT of lower extremity, bilateral ([place])    years ago   . Hypertension   . Parkinson's disease ([place])   . Vitamin D deficiency     PAST SURGICAL HISTORY:   Past Surgical History:  Procedure Laterality Date  . BREAST BIOPSY    . DILATION AND CURETTAGE OF UTERUS    . EYE SURGERY Right 05/21/2018  . KNEE SURGERY Left 01/2017  . REFRACTIVE SURGERY Bilateral   . TOTAL KNEE ARTHROPLASTY Left 05/21/2017   Procedure: TOTAL KNEE ARTHROPLASTY;  Surgeon: Frederik Pear, MD;  Location: [facility];  Service: Orthopedics;  Laterality: Left;    SOCIAL HISTORY:   Social History   Socioeconomic History  . Marital status: Married    Spouse name: Not on file  . Number of children: 1  . Years of education: Not on file  . Highest education level: Not on file  Occupational History  . Occupation: unempolyed   Social Needs  . Financial resource strain: Not on file  . Food insecurity:    Worry: Not on file    Inability: Not on file  . Transportation needs:    Medical: Not on file    Non-medical: Not on file  Tobacco Use  . Smoking  status: Never Smoker  . Smokeless tobacco: Never Used  Substance and Sexual Activity  . Alcohol use: No  . Drug use: No  . Sexual activity: Not on file  Lifestyle  . Physical activity:    Days per week: Not on file    Minutes per session: Not on file  . Stress: Not on file  Relationships  . Social connections:    Talks on phone: Not on file    Gets together: Not on file    Attends religious service: Not on file    Active member of club or organization: Not on file    Attends meetings of clubs or organizations: Not on file    Relationship status: Not on file  . Intimate partner violence:    Fear of current or ex partner: Not on file    Emotionally  abused: Not on file    Physically abused: Not on file    Forced sexual activity: Not on file  Other Topics Concern  . Not on file  Social History Narrative   Patient lives at home with partner [name].    Patient has one adult child.    Patient has 14 years of education.    Patient does not work.   Caffeine consumption is 1 cup daily     FAMILY HISTORY:   Family Status  Relation Name Status  . Father  Deceased at age 56       heart attack, smoker  . Mother  Deceased at age 58       heart   . Brother  Deceased       brain tumor  . Brother  Deceased       bladder cancer, smoker, ETOH  . Sister Hoyle Sauer Alive       stroke  . Sister Hayden Pedro       healthy  . Son  Alive       arthritis     ROS:  ROS  PHYSICAL EXAMINATION:    VITALS:   Vitals:   05/24/18 1244  BP: 134/82  Pulse: 68  SpO2: 98%  Weight: 180 lb (81.6 kg)  Height: _0  (1.651 m)   Wt Readings from Last 3 Encounters:  05/24/18 180 lb (81.6 kg)  01/30/18 177 lb (80.3 kg)  12/20/17 176 lb (79.8 kg)    GEN:  The patient appears stated age and is in NAD. HEENT:  Normocephalic, atraumatic.  The mucous membranes are moist. The superficial temporal arteries are without ropiness or tenderness. CV:  RRR Lungs:  CTAB Neck/HEME:  There are no  carotid bruits bilaterally.  Neurological examination:  Orientation: The patient is alert and oriented x3. Cranial nerves: There is good facial symmetry. The speech is fluent and clear. Soft palate rises symmetrically and there is no tongue deviation. Hearing is intact to conversational tone. Sensation: Sensation is intact to light touch throughout Motor: Strength is 5/5 in the bilateral upper and lower extremities.   Shoulder shrug is equal and symmetric.  There is no pronator drift.  Movement examination: Tone: There is normal tone in the RUE.  There is normal tone in the LUE.  There is normal tone in the RLE.  There is normal tone in the LLE.  Abnormal movements: There is no tremor.  There is mod dyskinesia, L more than R Coordination:  There is decremation, with any form of RAMS, including alternating supination and pronation of the forearm, hand opening and closing, finger taps, heel taps and toe taps on the left.  She does have decremation with hand opening and closing and toe taps on the right.   Gait and Station: The patient has no difficulty arising out of a deep-seated chair without the use of the hands. The patient has just mild start hesitation.  She walks well in the hallway.    Labs  Lab Results  Component Value Date   VITAMINB12 327 11/07/2017   Lab Results  Component Value Date   TSH 1.15 11/07/2017     Chemistry      Component Value Date/Time   NA 139 11/07/2017 1037   K 4.2 11/07/2017 1037   CL 103 11/07/2017 1037   CO2 29 11/07/2017 1037   BUN 15 11/07/2017 1037   CREATININE 1.06 11/07/2017 1037      Component Value Date/Time  CALCIUM 9.6 11/07/2017 1037   ALKPHOS 87 11/07/2017 1037   AST 16 11/07/2017 1037   ALT 4 11/07/2017 1037   BILITOT 1.1 11/07/2017 1037        ASSESSMENT/PLAN:  1.  Idiopathic Parkinson's disease, diagnosed in 2009.  She is experiencing motor fluctuations including dyskinesia and wearing off  -She will remain on Mirapex,  1.5 mg 3 times per day.  No compulsive behaviors.    -continue carbidopa/levodopa 25/100 qid  -failed IR amantadine for dyskinesia due to lightheadedness.   Cataract surgeon doesn't want to do other eye until dyskinesia under better control.  Will try osmolex ER, 129 mg x 2 weeks, then 193 mg thereafter (can stop at 129 mg if doing well)  -Asks me about DBS therapy.  I talked to the patient about the logistics associated with DBS therapy.  I talked to the patient about risks/benefits/side effects of DBS therapy.  We talked about risks which included but were not limited to infection, paralysis, intraoperative seizure, death, stroke, bleeding around the electrode.   I talked to patient about fiducial placement 1 week prior to DBS therapy.  I talked to the patient about what to expect in the operating room, including the fact that this is an awake surgery.  We talked about battery placement as well as which is done under general anesthesia, generally approximately one week following the initial surgery.  We also talked about the fact that the patient will need to be off of medications for surgery.  The patient and family were given the opportunity to ask questions, which they did, and I answered them to the best of my ability today.  She asks for patient literature and was given that today.   2.  REM behavior disorder.  -Overall mild.  Just consists of occasional yelling out at night.  She used to be on clonazepam but is no longer.  We'll just keep an eye on this.  Doesn't want to add medication 3.  History of B12 deficiency and peripheral neuropathy, per Dr. love/[facility] records.  -Did not see significant evidence of peripheral neuropathy on examination today, but this certainly could contribute to balance issues.  She really has no significant balance problems yet, however. On oral b12 4.  Follow up is anticipated in the next few months, sooner should new neurologic issues arise.  Much greater than 50% of  this visit was spent in counseling and coordinating care.  Total face to face time:  30 min

## 2018-05-24 ENCOUNTER — Encounter: Payer: Self-pay | Admitting: Neurology

## 2018-05-24 ENCOUNTER — Ambulatory Visit: Payer: Medicare HMO | Admitting: Neurology

## 2018-05-24 VITALS — BP 134/82 | HR 68 | Ht 65.0 in | Wt 180.0 lb

## 2018-05-24 DIAGNOSIS — G20 Parkinson's disease: Secondary | ICD-10-CM

## 2018-05-24 DIAGNOSIS — G249 Dystonia, unspecified: Secondary | ICD-10-CM

## 2018-05-24 NOTE — Patient Instructions (Signed)
1. Start Osmolex ER - 129 mg daily for 2 weeks. If your dyskinesia is controlled on this dose, stay on this dose. If it is not and you are not experiencing symptoms of lightheadedness you can increase dose to 193 mg - one tablet daily. Please call and let us know what dose works for you so we can send a prescription.

## 2018-06-11 DIAGNOSIS — M25562 Pain in left knee: Secondary | ICD-10-CM | POA: Diagnosis not present

## 2018-06-21 ENCOUNTER — Telehealth: Payer: Self-pay | Admitting: Neurology

## 2018-06-21 NOTE — Telephone Encounter (Signed)
Was reviewing our wait list and see patient now on there.  Can you call and find out if having an issue?  We just started osmolex for dyskinesia.

## 2018-06-21 NOTE — Telephone Encounter (Signed)
Left message on machine for patient to call back.

## 2018-06-24 ENCOUNTER — Other Ambulatory Visit: Payer: Self-pay | Admitting: Neurology

## 2018-06-24 NOTE — Telephone Encounter (Signed)
Spoke with patient.  She states she had asked to be on wait list as she started having more issues with her legs. She states she feels the urge that she has to move them and they hurt. Sometimes she has a burning sensation. These things come and go.   Patient didn't ever start Osmolex. She states she just never did (didn't really give me a good reason.)  She can do an e-visit if necessary. Spoke with patient and verified that they do have a video compatible device. Best number to reach them is [phone]. Email: [email or web address]  Dr. Arbutus Leas - please advise.

## 2018-06-24 NOTE — Telephone Encounter (Signed)
Put her on evisit schedule in the next week or two please

## 2018-06-26 NOTE — Progress Notes (Signed)
Virtual Visit via Video Note The purpose of this virtual visit is to provide medical care while limiting exposure to the novel coronavirus.    Consent was obtained for video visit:  Yes.   Answered questions that patient had about telehealth interaction:  Yes.   I discussed the limitations, risks, security and privacy concerns of performing an evaluation and management service by telemedicine. I also discussed with the patient that there may be a patient responsible charge related to this service. The patient expressed understanding and agreed to proceed.  Pt location: Home Physician Location: office Name of referring provider:  Sheliah Hatch, MD I connected with Carly [name] at patients initiation/request on 07/01/2018 at  9:00 AM EDT by video enabled telemedicine application and verified that I am speaking with the correct person using two identifiers. Pt MRN:  [id number] Pt DOB:  03/03/1946  People present: pt/son (Carly [name]); friend Carly [name]) and they both supplement the history.   History of Present Illness:  Patient has a history of Parkinson's disease.  Patient is on pramipexole 1.5 mg 3 times per day and carbidopa/levodopa 25/100, 1 tablet 4 times per day.  Last visit, she was complaining about dyskinesia, so much so that her cataract surgeon refused to do her other eye until the dyskinesia was under better control.  I gave her samples of Osmolex last visit at the end of February.  She called me on March 20 to state that she was having more issues with her legs and feels the urge that she has to move.  When I asked her about how she did with the Osmolex, she stated that she never tried it.  When asked today about the, there does not seem to be a great reason on why she did not start it.  She references her cataract surgery, but the other side still has not been completed.  She complains about cramping of the toes at night.  It is quite painful.  She complains of a burning of  her legs below the knee.  She states that it feels like her blood is pooling in the legs.  Her son states that it really is affecting her sleep and she has had significant difficulty with this because of the symptoms.    Observations/Objective:   Vitals:   07/01/18 0927  BP: 103/62  Pulse: (!) 58    GEN:  The patient appears stated age and is in NAD.  Neurological examination:  Orientation: The patient is alert and oriented x3. Cranial nerves: There is good facial symmetry. There is mild facial hypomimia.  The speech is fluent and clear. Soft palate rises symmetrically and there is no tongue deviation. Hearing is intact to conversational tone. Motor: Strength is at least antigravity x 4.   Shoulder shrug is equal and symmetric.  There is no pronator drift.  Movement examination: Tone: unable Abnormal movements: There is dyskinesia noted, in the left hemibody more than the right. Coordination:  There is  decremation with RAM's, with any form of RAMS, including alternating supination and pronation of the forearm, hand opening and closing, finger taps, heel taps and toe taps on the left, but it is actually quite mild Gait and Station: The patient was standing at her counter throughout the visit.  She walked well throughout her home.      Assessment and Plan:   1.  Idiopathic Parkinson's disease, diagnosed in 2009.  She is experiencing motor fluctuations including dyskinesia and wearing off             -  She will remain on Mirapex, 1.5 mg 3 times per day.  No compulsive behaviors.               -continue carbidopa/levodopa 25/100 qid (told to take it at 7 AM/11 AM/3 PM/7 PM).  -She will add carbidopa/levodopa 50/200 at bedtime for cramping of the feet and legs at night.  I am not sure if this is going to help the burning paresthesias, but I do not want to add too many medicines at once.  -Last visit she was given Osmolex ER for the dyskinesia, because her cataract surgeon did not want to do  the other eye until dyskinesia was under better control.  I am not sure why she did not try this, but she did not.  I told her to start the bedtime levodopa first and try that for 2 weeks and as long as she was doing well, then she could trial the osmolal X.  She was supposed to do Osmolex ER, 129 mg for 2 weeks and then 193 mg thereafter if she needed the higher dose. 2.  REM behavior disorder.             -Overall mild.  Just consists of occasional yelling out at night.  She used to be on clonazepam but is no longer.  We'll just keep an eye on this.  Doesn't want to add medication 3.  History of B12 deficiency and peripheral neuropathy, per Dr. love/[facility] records.             -On oral B12.  Prior examinations have not revealed evidence of peripheral neuropathy, although she complains about some burning paresthesias today. 4.  She has an appointment in July.  She will keep that appointment, and will call if she needs me before then.    Follow Up Instructions:      -I discussed the assessment and treatment plan with the patient. The patient was provided an opportunity to ask questions and all were answered. The patient agreed with the plan and demonstrated an understanding of the instructions.   The patient was advised to call back or seek an in-person evaluation if the symptoms worsen or if the condition fails to improve as anticipated.    Total Time spent in visit with the patient was:  25 min, of which more than 50% of the time was spent in counseling and/or coordinating care on safety.   Pt understands and agrees with the plan of care outlined.     Kerin Salen, DO

## 2018-06-28 ENCOUNTER — Telehealth: Payer: Self-pay | Admitting: Neurology

## 2018-06-28 NOTE — Telephone Encounter (Signed)
Verified medication for e-visit on Monday. Patient does have access to a scale and blood pressure machine. She will have readings available for visit.   Dr. Arbutus Leas Lorain Childes.

## 2018-07-01 ENCOUNTER — Telehealth (INDEPENDENT_AMBULATORY_CARE_PROVIDER_SITE_OTHER): Payer: Medicare HMO | Admitting: Neurology

## 2018-07-01 ENCOUNTER — Other Ambulatory Visit: Payer: Self-pay

## 2018-07-01 DIAGNOSIS — E538 Deficiency of other specified B group vitamins: Secondary | ICD-10-CM

## 2018-07-01 DIAGNOSIS — G20 Parkinson's disease: Secondary | ICD-10-CM

## 2018-07-01 DIAGNOSIS — G249 Dystonia, unspecified: Secondary | ICD-10-CM | POA: Diagnosis not present

## 2018-07-01 DIAGNOSIS — G4752 REM sleep behavior disorder: Secondary | ICD-10-CM

## 2018-07-01 MED ORDER — CARBIDOPA-LEVODOPA ER 50-200 MG PO TBCR: 1.0000 | EXTENDED_RELEASE_TABLET | Freq: Every day | ORAL | 1 refills | Status: DC

## 2018-07-01 NOTE — Patient Instructions (Signed)
1.   add carbidopa/levodopa 50/200 at bedtime for cramping of the feet and legs at night. 2.  IF doing okay with the above in 2 weeks, then you can try the osmolex ER samples I gave you last time for the wiggly movements (dyskinesia).  You will take Osmolex ER, 129 mg for 2 weeks and then 193 mg thereafter only if you need the higher dose (if the 129 mg didn't control the movements) 3.  remain on pramipexole1.5 mg 3 times per day.  4.  continue carbidopa/levodopa 25/100 four times per day at 7 AM/11 AM/3 PM/7 PM

## 2018-07-09 ENCOUNTER — Encounter

## 2018-07-09 ENCOUNTER — Ambulatory Visit: Payer: Medicare HMO | Admitting: Neurology

## 2018-07-15 ENCOUNTER — Encounter: Payer: Self-pay | Admitting: *Deleted

## 2018-07-30 ENCOUNTER — Encounter: Payer: Self-pay | Admitting: Family Medicine

## 2018-07-30 ENCOUNTER — Ambulatory Visit (INDEPENDENT_AMBULATORY_CARE_PROVIDER_SITE_OTHER): Payer: Medicare HMO | Admitting: Family Medicine

## 2018-07-30 ENCOUNTER — Other Ambulatory Visit: Payer: Self-pay

## 2018-07-30 ENCOUNTER — Encounter: Payer: Self-pay | Admitting: *Deleted

## 2018-07-30 VITALS — BP 125/61 | HR 62 | Ht 65.0 in | Wt 178.0 lb

## 2018-07-30 DIAGNOSIS — M25562 Pain in left knee: Secondary | ICD-10-CM | POA: Diagnosis not present

## 2018-07-30 MED ORDER — MELOXICAM 15 MG PO TABS: 15.0000 mg | ORAL_TABLET | Freq: Every day | ORAL | 0 refills | Status: DC

## 2018-07-30 MED ORDER — TRAMADOL HCL 50 MG PO TABS: 50.0000 mg | ORAL_TABLET | Freq: Three times a day (TID) | ORAL | 0 refills | Status: AC | PRN

## 2018-07-30 NOTE — Progress Notes (Signed)
I have discussed the procedure for the virtual visit with the patient who has given consent to proceed with assessment and treatment.   Jessica L Brodmerkel, CMA     

## 2018-07-30 NOTE — Progress Notes (Signed)
Virtual Visit via Video   I connected with patient on 07/30/18 at 10:20 AM EDT by a video enabled telemedicine application and verified that I am speaking with the correct person using two identifiers.  Location patient: Home Location provider: [facility], Office Persons participating in the virtual visit: Patient, Provider, CMA (Jess B)  I discussed the limitations of evaluation and management by telemedicine and the availability of in person appointments. The patient expressed understanding and agreed to proceed.  Subjective:   HPI:   Knee pain- 'it's the knee that I had replaced' (L knee).  sxs started 'really bad 3 days ago'.  No falls or injury.  Pain is behind the knee 'where it bends' and in front (patellar tendon).  Pt has not been able to do her regular walking.  Minimal swelling.  No redness of warmth.  Pt has known Baker's cyst.  No relief w/ tylenol, took 2 advil w/ some relief.  Pt had left over pain meds from surgery that helped.  Pt reports pain was severe upon waking but 'now it's not as bad'.  Pt has been icing w/ some relief.  Has not called Ortho.  ROS:   See pertinent positives and negatives per HPI.  Patient Active Problem List   Diagnosis Date Noted  . Vitamin D deficiency 11/07/2017  . Primary osteoarthritis of left knee 05/21/2017  . Degenerative arthritis of left knee 05/18/2017  . Anterior cervical lymphadenopathy 05/14/2015  . Osteopenia 02/15/2015  . Physical exam 02/09/2014  . B12 deficiency 01/01/2014  . Bronchitis 04/12/2011  . ACHILLES TENDINITIS 01/31/2010  . PARKINSON'S DISEASE 07/08/2008  . TREMOR, RIGHT HAND 04/07/2008  . Anxiety state 01/31/2007  . HTN (hypertension) 01/31/2007    Social History   Tobacco Use  . Smoking status: Never Smoker  . Smokeless tobacco: Never Used  Substance Use Topics  . Alcohol use: No    Current Outpatient Medications:  .  ALPRAZolam (XANAX) 0.5 MG tablet, Take 1 tablet (0.5 mg total) by  mouth 2 (two) times daily as needed for anxiety., Disp: 30 tablet, Rfl: 1 .  carbidopa-levodopa (SINEMET CR) 50-200 MG tablet, Take 1 tablet by mouth at bedtime., Disp: 90 tablet, Rfl: 1 .  carbidopa-levodopa (SINEMET IR) 25-100 MG tablet, TAKE 1 TABLET BY MOUTH 4 TIMES DAILY, Disp: 360 tablet, Rfl: 1 .  latanoprost (XALATAN) 0.005 % ophthalmic solution, Place 1 drop into both eyes at bedtime. , Disp: , Rfl:  .  pramipexole (MIRAPEX) 1 MG tablet, TAKE 1 AND 1/2 TABLET BY MOUTH THREE TIMES A DAY, Disp: 135 tablet, Rfl: 5  Allergies  Allergen Reactions  . Codeine Nausea Only and Other (See Comments)    hallucinations    Objective:   BP 125/61   Pulse 62   Ht 5\' 5"  (1.651 m)   Wt 178 lb (80.7 kg)   BMI 29.62 kg/m   AAOx3, NAD NCAT, EOMI No obvious CN deficits Coloring WNL Unable to exam knee as pt's camera was in a fixed position Pt is able to speak clearly, coherently without shortness of breath or increased work of breathing.  Thought process is linear.  Mood is appropriate.   Assessment and Plan:   L knee pain- acute x3 days.  Pt has had knee replacement and has known Baker's cyst in the area.  She feels the area behind L knee 'is different' than on R.  Discussed possibility of inflamed Baker's cyst and need for scheduled NSAIDs and pain meds prn.  If no improvement, will need to call Ortho.  Pt expressed understanding and is in agreement w/ plan.   Carly RhymesKatherine Aryiana Klinkner, MD 07/30/2018

## 2018-08-21 ENCOUNTER — Telehealth: Payer: Self-pay

## 2018-08-21 NOTE — Telephone Encounter (Signed)
Called patient to follow up on Osmolex  We have enrollment/ Rx form to be fax back for patient.  Was waiting to see how patient was doing on samples.  Pt has just started taken medication on Monday she has 2 weeks left.  Informed patient to call office back to see how she is doing with medication prior to sending in forms.  Pt aware and understands will call back in 2 weeks when complete

## 2018-09-03 DIAGNOSIS — H353121 Nonexudative age-related macular degeneration, left eye, early dry stage: Secondary | ICD-10-CM | POA: Diagnosis not present

## 2018-09-03 DIAGNOSIS — H401131 Primary open-angle glaucoma, bilateral, mild stage: Secondary | ICD-10-CM | POA: Diagnosis not present

## 2018-09-03 DIAGNOSIS — H5213 Myopia, bilateral: Secondary | ICD-10-CM | POA: Diagnosis not present

## 2018-09-03 DIAGNOSIS — H25012 Cortical age-related cataract, left eye: Secondary | ICD-10-CM | POA: Diagnosis not present

## 2018-09-20 ENCOUNTER — Other Ambulatory Visit: Payer: Self-pay | Admitting: Neurology

## 2018-09-20 NOTE — Telephone Encounter (Signed)
Requested Prescriptions   Pending Prescriptions Disp Refills  . pramipexole (MIRAPEX) 1 MG tablet [Pharmacy Med Name: PRAMIPEXOLE 1 MG TABLET] 135 tablet 4    Sig: TAKE 1 AND 1/2 TABLET BY MOUTH THREE TIMES A DAY   Rx last filled: 02/22/18 #135 5 refills  Pt last seen:07/01/18  Follow up appt scheduled:10/22/18

## 2018-09-26 DIAGNOSIS — M25562 Pain in left knee: Secondary | ICD-10-CM | POA: Diagnosis not present

## 2018-09-27 DIAGNOSIS — Z96652 Presence of left artificial knee joint: Secondary | ICD-10-CM | POA: Diagnosis not present

## 2018-09-27 DIAGNOSIS — M25562 Pain in left knee: Secondary | ICD-10-CM | POA: Diagnosis not present

## 2018-09-30 ENCOUNTER — Other Ambulatory Visit (HOSPITAL_COMMUNITY): Payer: Self-pay | Admitting: Orthopedic Surgery

## 2018-09-30 ENCOUNTER — Other Ambulatory Visit: Payer: Self-pay | Admitting: Orthopedic Surgery

## 2018-09-30 DIAGNOSIS — M25562 Pain in left knee: Secondary | ICD-10-CM

## 2018-10-09 DIAGNOSIS — H401131 Primary open-angle glaucoma, bilateral, mild stage: Secondary | ICD-10-CM | POA: Diagnosis not present

## 2018-10-18 NOTE — Progress Notes (Signed)
Carly [name] was seen today in follow up for Parkinsons disease.  Pt is currently on carbidopa/levodopa 25/100, 1 tablet 4 times per day.  We added carbidopa/levodopa 50/200 at bedtime last visit for cramping of the feet and legs at night.  She reports that "I take it sporadically."  States that she often goes to bed really early, like 5:30-6pm.  States that the cramping has been "really bad" but admits that the medication works for the cramping.    She is still on pramipexole 1.5 mg 3 times per day.  She has not had any compulsive behaviors or sleep attacks.  Last visit, I told her to start the [facility] ER for dyskinesia.  However, we called her in May to see how she was doing on that (2 months after I saw her) and she reported that she had just started it.  She states today that she never took it.  She brings in the sample bottle today.  pt denies falls.  Pt denies lightheadedness, near syncope.  No hallucinations.  Mood has been good.  Yelling some at night but now using bed rail   PREVIOUS MEDICATIONS:  Sinemet CR, pramipexole, Azilect, selegiline, amantadine (lightheadedness); osmolex er (samples given but never took it)  ALLERGIES:   Allergies  Allergen Reactions  . Codeine Nausea Only and Other (See Comments)    hallucinations    CURRENT MEDICATIONS:  Outpatient Encounter Medications as of 10/22/2018  Medication Sig  . ALPRAZolam (XANAX) 0.5 MG tablet Take 1 tablet (0.5 mg total) by mouth 2 (two) times daily as needed for anxiety.  . carbidopa-levodopa (SINEMET CR) 50-200 MG tablet Take 1 tablet by mouth at bedtime.  . carbidopa-levodopa (SINEMET IR) 25-100 MG tablet TAKE 1 TABLET BY MOUTH 4 TIMES DAILY  . latanoprost (XALATAN) 0.005 % ophthalmic solution Place 1 drop into both eyes at bedtime.   . meloxicam (MOBIC) 15 MG tablet Take 1 tablet (15 mg total) by mouth daily.  . pramipexole (MIRAPEX) 1 MG tablet TAKE 1 AND 1/2 TABLET BY MOUTH THREE TIMES A DAY   No  facility-administered encounter medications on file as of 10/22/2018.     PAST MEDICAL HISTORY:   Past Medical History:  Diagnosis Date  . Anemia   . Anxiety   . Arthritis   . DVT of lower extremity, bilateral ([place])    years ago   . Hypertension   . Parkinson's disease ([place])   . Vitamin D deficiency     PAST SURGICAL HISTORY:   Past Surgical History:  Procedure Laterality Date  . BREAST BIOPSY    . DILATION AND CURETTAGE OF UTERUS    . EYE SURGERY Right 05/21/2018  . KNEE SURGERY Left 01/2017  . REFRACTIVE SURGERY Bilateral   . TOTAL KNEE ARTHROPLASTY Left 05/21/2017   Procedure: TOTAL KNEE ARTHROPLASTY;  Surgeon: Frederik Pear, MD;  Location: [facility];  Service: Orthopedics;  Laterality: Left;    SOCIAL HISTORY:   Social History   Socioeconomic History  . Marital status: Married    Spouse name: Not on file  . Number of children: 1  . Years of education: Not on file  . Highest education level: Not on file  Occupational History  . Occupation: unempolyed   Social Needs  . Financial resource strain: Not on file  . Food insecurity    Worry: Not on file    Inability: Not on file  . Transportation needs    Medical: Not on file  Non-medical: Not on file  Tobacco Use  . Smoking status: Never Smoker  . Smokeless tobacco: Never Used  Substance and Sexual Activity  . Alcohol use: No  . Drug use: No  . Sexual activity: Not on file  Lifestyle  . Physical activity    Days per week: Not on file    Minutes per session: Not on file  . Stress: Not on file  Relationships  . Social Herbalist on phone: Not on file    Gets together: Not on file    Attends religious service: Not on file    Active member of club or organization: Not on file    Attends meetings of clubs or organizations: Not on file    Relationship status: Not on file  . Intimate partner violence    Fear of current or ex partner: Not on file    Emotionally abused: Not on file    Physically abused:  Not on file    Forced sexual activity: Not on file  Other Topics Concern  . Not on file  Social History Narrative   Patient lives at home with partner Carly [name].    Patient has one adult child.    Patient has 14 years of education.    Patient does not work.   Caffeine consumption is 1 cup daily     FAMILY HISTORY:   Family Status  Relation Name Status  . Father  Deceased at age 61       heart attack, smoker  . Mother  Deceased at age 22       heart   . Brother  Deceased       brain tumor  . Brother  Deceased       bladder cancer, smoker, ETOH  . Sister Hoyle Sauer Alive       stroke  . Sister Hayden Pedro       healthy  . Son  Alive       arthritis     ROS:  Review of Systems  Constitutional: Negative.   HENT: Negative.   Eyes: Negative.   Respiratory: Negative.   Cardiovascular: Negative.   Gastrointestinal: Negative.   Genitourinary: Negative.   Musculoskeletal: Negative.   Skin: Negative.     PHYSICAL EXAMINATION:    VITALS:   Vitals:   10/22/18 1304  BP: (!) 164/73  Pulse: 61  Temp: 97.8 F (36.6 C)  SpO2: 97%  Weight: 178 lb 3.2 oz (80.8 kg)  Height: '5\' 4"'$  (1.626 m)    GEN:  The patient appears stated age and is in NAD. HEENT:  Normocephalic, atraumatic.  The mucous membranes are moist. The superficial temporal arteries are without ropiness or tenderness. CV:  RRR Lungs:  CTAB Neck/HEME:  There are no carotid bruits bilaterally.  Neurological examination:  Orientation: The patient is alert and oriented x3. Cranial nerves: There is good facial symmetry no significant with facial hypomimia. The speech is fluent and clear. Soft palate rises symmetrically and there is no tongue deviation. Hearing is intact to conversational tone. Sensation: Sensation is intact to light touch throughout Motor: Strength is at least antigravity x4.  Movement examination: Tone: There is normal tone in the UE/LE Abnormal movements: no tremor.  Mild to mod axial  dyskinesia and LLE. Coordination:  There is no decremation with RAM's, with any form of RAMS, including alternating supination and pronation of the forearm, hand opening and closing, finger taps, heel taps and toe taps. Gait  and Station: The patients gait is antalgic.  She drags the R leg somewhat with ambulation.   ASSESSMENT/PLAN:  1.  Parkinson's disease, diagnosed 2009  -Continue carbidopa/levodopa 25/100, 1 tablet 4 times per day  -encouraged her to take carbidopa/levodopa 50/200 at bedtime.  Whenever she stops it, she gets terrible cramping but she hasn't been very compliant with taking the medication.  -Continue pramipexole 1.5 mg 3 times per day.  -I gave her the osmolex ER for the dyskinesia as the cataract surgeon would not do the other eye without control of it but she never took it and never had the other eye (left eye) done.  She doesn't want to have the other eye done right now, and does not find dyskinesia bothersome, so I told her to just hold on the medication for right now.  -discussed RSB online and encouraged her to attend.    -met my PD social worker today  2.  B12 deficiency  -On oral supplementation but admits is inconsistent with this.  Encouraged her to take this regularly.  3.  REM behavior disorder  -Not on any medications.  Overall, symptoms are mild.  Discussed bedroom safety.  Now using bed rail  Cc:  Midge Minium, MD

## 2018-10-22 ENCOUNTER — Ambulatory Visit: Payer: Medicare HMO | Admitting: Neurology

## 2018-10-22 ENCOUNTER — Other Ambulatory Visit: Payer: Self-pay

## 2018-10-22 ENCOUNTER — Encounter: Payer: Self-pay | Admitting: Neurology

## 2018-10-22 VITALS — BP 164/73 | HR 61 | Temp 97.8°F | Ht 64.0 in | Wt 178.2 lb

## 2018-10-22 DIAGNOSIS — G20 Parkinson's disease: Secondary | ICD-10-CM

## 2018-10-22 DIAGNOSIS — G4752 REM sleep behavior disorder: Secondary | ICD-10-CM

## 2018-10-22 DIAGNOSIS — E538 Deficiency of other specified B group vitamins: Secondary | ICD-10-CM

## 2018-10-22 DIAGNOSIS — G249 Dystonia, unspecified: Secondary | ICD-10-CM | POA: Diagnosis not present

## 2018-10-22 NOTE — Patient Instructions (Signed)
Take your carbidopa/levodopa 50/200 at bedtime to help the cramping at night.  You can hold off on taking the osmolex for now.  The physicians and staff at [facility] are committed to providing excellent care. You may receive a survey requesting feedback about your experience at our office. We strive to receive "very good" responses to the survey questions. If you feel that your experience would prevent you from giving the office a "very good " response, please contact our office to try to remedy the situation. We may be reached at [phone]. Thank you for taking the time out of your busy day to complete the survey.

## 2018-11-22 ENCOUNTER — Other Ambulatory Visit: Payer: Self-pay

## 2018-11-22 MED ORDER — PRAMIPEXOLE DIHYDROCHLORIDE 1 MG PO TABS: ORAL_TABLET | ORAL | 1 refills | Status: DC

## 2018-11-22 NOTE — Telephone Encounter (Signed)
Pharmacy requesting 90 day supply  Requested Prescriptions   Pending Prescriptions Disp Refills  . pramipexole (MIRAPEX) 1 MG tablet 380 tablet 1    Sig: Take  1.5 tablets by mouth three times a day   Sent  rx last filled: 09/20/18 3135 4 refills Pt last seen: 10/2118

## 2018-11-29 DIAGNOSIS — H353121 Nonexudative age-related macular degeneration, left eye, early dry stage: Secondary | ICD-10-CM | POA: Diagnosis not present

## 2018-11-29 DIAGNOSIS — H25012 Cortical age-related cataract, left eye: Secondary | ICD-10-CM | POA: Diagnosis not present

## 2018-11-29 DIAGNOSIS — H401134 Primary open-angle glaucoma, bilateral, indeterminate stage: Secondary | ICD-10-CM | POA: Diagnosis not present

## 2018-12-06 DIAGNOSIS — H35363 Drusen (degenerative) of macula, bilateral: Secondary | ICD-10-CM | POA: Diagnosis not present

## 2018-12-06 DIAGNOSIS — H43813 Vitreous degeneration, bilateral: Secondary | ICD-10-CM | POA: Diagnosis not present

## 2018-12-06 DIAGNOSIS — H35373 Puckering of macula, bilateral: Secondary | ICD-10-CM | POA: Diagnosis not present

## 2018-12-06 DIAGNOSIS — Q141 Congenital malformation of retina: Secondary | ICD-10-CM | POA: Diagnosis not present

## 2018-12-25 ENCOUNTER — Telehealth: Payer: Self-pay | Admitting: *Deleted

## 2018-12-25 NOTE — Telephone Encounter (Signed)
LM for patient to call to schedule AWV

## 2019-02-07 DIAGNOSIS — H401134 Primary open-angle glaucoma, bilateral, indeterminate stage: Secondary | ICD-10-CM | POA: Diagnosis not present

## 2019-02-07 DIAGNOSIS — H353121 Nonexudative age-related macular degeneration, left eye, early dry stage: Secondary | ICD-10-CM | POA: Diagnosis not present

## 2019-02-07 DIAGNOSIS — H0015 Chalazion left lower eyelid: Secondary | ICD-10-CM | POA: Diagnosis not present

## 2019-03-05 DIAGNOSIS — H0015 Chalazion left lower eyelid: Secondary | ICD-10-CM | POA: Diagnosis not present

## 2019-03-05 DIAGNOSIS — H401134 Primary open-angle glaucoma, bilateral, indeterminate stage: Secondary | ICD-10-CM | POA: Diagnosis not present

## 2019-03-05 DIAGNOSIS — H353121 Nonexudative age-related macular degeneration, left eye, early dry stage: Secondary | ICD-10-CM | POA: Diagnosis not present

## 2019-03-05 DIAGNOSIS — H25012 Cortical age-related cataract, left eye: Secondary | ICD-10-CM | POA: Diagnosis not present

## 2019-03-10 ENCOUNTER — Encounter: Payer: Self-pay | Admitting: Neurology

## 2019-03-24 NOTE — Progress Notes (Signed)
Virtual Visit via Video Note The purpose of this virtual visit is to provide medical care while limiting exposure to the novel coronavirus.    Consent was obtained for video visit:  Yes.   Answered questions that patient had about telehealth interaction:  Yes.   I discussed the limitations, risks, security and privacy concerns of performing an evaluation and management service by telemedicine. I also discussed with the patient that there may be a patient responsible charge related to this service. The patient expressed understanding and agreed to proceed.  Pt location: Home Physician Location: office Name of referring provider:  Midge Minium, MD I connected with Marni Griffon at patients initiation/request on 03/25/2019 at  3:30 PM EST by video enabled telemedicine application and verified that I am speaking with the correct person using two identifiers. Pt MRN:  [id number] Pt DOB:  02/12/1946 Video Participants:  Marni Griffon;  Theone Stanley (pts friend)   History of Present Illness:  Patient seen today in follow-up for Parkinson's disease.  Pt denies falls (her friend does state that she had one coming down the stairs and she fell - didn't get hurt) but states that she has had near falls and had a lot of trouble walking in general.  Pt denies lightheadedness, near syncope.  No hallucinations.  Mood has been good.  In regards to RBD, she states that she has been doing well.  No episodes of falling out of the bed.  She denies compulsive behaviors or sleep attacks.  She is having some dyskinesia.  Having leg cramping at night.  Friend mentions at the patient has been having intermittent left arm and leg numbness.  It seems to come and go for the last several months.  The cannot state if it is the entire arm and entire leg and if those occur at the same time, or if the numbness in the left arm is separate from the numbness in the left leg.  The patient does state that it sometimes is  associated with a painful cramping.  No lateralizing weakness, but her walking has definitely been off.  No speech change.  Current movement disorder medications Carbidopa/levodopa 25/100, 1 tablet 4 times per day (8am/11:30/3:30/bed Carbidopa/levodopa 50/200 at bedtime (pt has accidentally dropped this one) Pramipexole 1 mg, 1.5 tablets 3 times daily.   Current Outpatient Medications on File Prior to Visit  Medication Sig Dispense Refill  . ALPRAZolam (XANAX) 0.5 MG tablet Take 1 tablet (0.5 mg total) by mouth 2 (two) times daily as needed for anxiety. 30 tablet 1  . carbidopa-levodopa (SINEMET IR) 25-100 MG tablet TAKE 1 TABLET BY MOUTH 4 TIMES DAILY 360 tablet 1  . latanoprost (XALATAN) 0.005 % ophthalmic solution Place 1 drop into both eyes at bedtime.     . meloxicam (MOBIC) 15 MG tablet Take 1 tablet (15 mg total) by mouth daily. 30 tablet 0   No current facility-administered medications on file prior to visit.     Observations/Objective:   There were no vitals filed for this visit. GEN:  The patient appears stated age and is in NAD.  Neurological examination:  Orientation: The patient is alert and oriented x3.  Friend supplies the more detailed aspects of the history. Cranial nerves: There is good facial symmetry. There is mild facial hypomimia.  The speech is fluent and clear. Soft palate rises symmetrically and there is no tongue deviation. Hearing is intact to conversational tone. Motor: Strength is at least antigravity x 4.  Shoulder shrug is equal and symmetric.  There is no pronator drift.  Movement examination: Tone: unable Abnormal movements: There is moderate dyskinesia Coordination:  There is mild decremation with RAM's, with hand opening and closing and finger taps. Gait and Station: The patient ambulates well in her home today (she states that this is a good time, but later on may not be).    Assessment and Plan:   1.  Parkinson's disease, diagnosed  2009  -Continue carbidopa/levodopa 25/100, 1 tablet 4 times per day.  Change the timing of her medication to 1 tablet at 6 AM/10 AM/2 PM/6 PM (patient is up very early and goes to bed by about 8 PM).  -Restart carbidopa/levodopa 50/200 at bedtime.  Whenever this is stopped, she gets terrible cramping in the legs, which has happened again.  I sent a new prescription.  -Continue pramipexole 1.5 mg 3 times per day.  May need to look at this medication in the future.  I think maybe we are having some memory change, and that this medication can contribute.  -She has having moderate dyskinesia.  She has been given medication for that in the past, but opted not to take it (has Osmolex samples from me).  She does state that she may have changed her mind, but I am restarting and changing other things and do not want to do that right now.  -[facility] her [facility] due to falls and trouble walking. 2.  B12 deficiency  -On oral supplementation 3.  REM behavior disorder  -Not on any meds.  Diet and safety discussed.  Symptoms are overall mild. 4.  Left arm/leg paresthesias  -Has been going on for several months.  We will do an MRI of the brain to make sure we are not missing anything, especially since walking has deteriorated (although medications have not been taken quite right either).  Follow Up Instructions:  I am going to see the patient back in about 4 weeks since she is not doing well.  -I discussed the assessment and treatment plan with the patient. The patient was provided an opportunity to ask questions and all were answered. The patient agreed with the plan and demonstrated an understanding of the instructions.   The patient was advised to call back or seek an in-person evaluation if the symptoms worsen or if the condition fails to improve as anticipated.    Total Time spent in visit with the patient was:  28 min, of which more than 50% of the time was spent in  counseling .   Pt understands and agrees with the plan of care outlined.     Kerin Salen, DO

## 2019-03-25 ENCOUNTER — Other Ambulatory Visit: Payer: Self-pay

## 2019-03-25 ENCOUNTER — Telehealth (INDEPENDENT_AMBULATORY_CARE_PROVIDER_SITE_OTHER): Payer: Medicare HMO | Admitting: Neurology

## 2019-03-25 ENCOUNTER — Encounter: Payer: Self-pay | Admitting: Neurology

## 2019-03-25 DIAGNOSIS — G20 Parkinson's disease: Secondary | ICD-10-CM

## 2019-03-25 DIAGNOSIS — R202 Paresthesia of skin: Secondary | ICD-10-CM

## 2019-03-25 MED ORDER — PRAMIPEXOLE DIHYDROCHLORIDE 1 MG PO TABS: ORAL_TABLET | ORAL | 1 refills | Status: DC

## 2019-03-25 MED ORDER — CARBIDOPA-LEVODOPA ER 50-200 MG PO TBCR: 1.0000 | EXTENDED_RELEASE_TABLET | Freq: Every day | ORAL | 1 refills | Status: DC

## 2019-04-07 DIAGNOSIS — G20 Parkinson's disease: Secondary | ICD-10-CM | POA: Diagnosis not present

## 2019-04-10 ENCOUNTER — Encounter: Payer: Self-pay | Admitting: Neurology

## 2019-04-10 DIAGNOSIS — G20 Parkinson's disease: Secondary | ICD-10-CM | POA: Diagnosis not present

## 2019-04-14 DIAGNOSIS — G20 Parkinson's disease: Secondary | ICD-10-CM | POA: Diagnosis not present

## 2019-04-16 DIAGNOSIS — G20 Parkinson's disease: Secondary | ICD-10-CM | POA: Diagnosis not present

## 2019-04-22 DIAGNOSIS — G20 Parkinson's disease: Secondary | ICD-10-CM | POA: Diagnosis not present

## 2019-04-24 DIAGNOSIS — G20 Parkinson's disease: Secondary | ICD-10-CM | POA: Diagnosis not present

## 2019-04-24 NOTE — Progress Notes (Signed)
Carly [name] was seen today in follow up for Parkinsons disease.  My previous records were reviewed prior to todays visit.  I told her to move her medications closer together last visit.  I also restarted her bedtime levodopa to help the cramping in the feet and legs at night.  She reports that she is still cramping some but she thinks that the medication may make her sleepy/groggy.  I ordered physical therapy after our last visit [facility] and she reports that she is doing well.  pt had a fall.  States that it was prior to starting PT.  The dog tripped her and hurt her leg.  The PT has helped the pain in her leg.  She had a near fall this morning when the dog nearly tripped her.  Pt denies lightheadedness, near syncope.  No hallucinations.  Mood has been good.  Last visit, she was describing episodes of intermittent left arm and leg numbness.  I ordered an MRI of the brain.  It still has not been done.  Unfortunately, I was unaware of that.  She is still having those episodes.  Each episode may last less than a min.    Current prescribed movement disorder medications: Carbidopa/levodopa 25/100, 1 tablet at 6am/10am/2pm/6pm Carbidopa/levodopa 50/200 at bedtime  Pramipexole 1 mg, 1.5 tablets 3 times daily.  Pt drives but rarely.  Usually her husband drives and friend or grandchild    ALLERGIES:   Allergies  Allergen Reactions  . Codeine Nausea Only and Other (See Comments)    hallucinations    CURRENT MEDICATIONS:  Outpatient Encounter Medications as of 04/25/2019  Medication Sig  . ALPRAZolam (XANAX) 0.5 MG tablet Take 1 tablet (0.5 mg total) by mouth 2 (two) times daily as needed for anxiety.  . carbidopa-levodopa (SINEMET CR) 50-200 MG tablet Take 1 tablet by mouth at bedtime.  . carbidopa-levodopa (SINEMET IR) 25-100 MG tablet TAKE 1 TABLET BY MOUTH 4 TIMES DAILY  . latanoprost (XALATAN) 0.005 % ophthalmic solution Place 1 drop into both eyes at bedtime.   .  meloxicam (MOBIC) 15 MG tablet Take 1 tablet (15 mg total) by mouth daily.  . pramipexole (MIRAPEX) 1 MG tablet Take  1.5 tablets by mouth three times a day   No facility-administered encounter medications on file as of 04/25/2019.    PHYSICAL EXAMINATION:    VITALS:   Vitals:   04/25/19 0853  BP: 130/70  Pulse: 78  Resp: 16  SpO2: 98%  Weight: 181 lb (82.1 kg)  Height: 5\' 5"  (1.651 m)    GEN:  The patient appears stated age and is in NAD. HEENT:  Normocephalic, atraumatic.  The mucous membranes are moist. The superficial temporal arteries are without ropiness or tenderness. CV:  RRR Lungs:  CTAB Neck/HEME:  There are no carotid bruits bilaterally.  Neurological examination:  Orientation:  [employer] Mental Exam 04/25/2019  Weekday Correct 1  Current year 1  What state are we in? 1  Amount spent 1  Amount left 0  # of Animals 2  5 objects recall 2  Number series 1  Hour markers 2  Time correct 0  Placed X in triangle correctly 1  Largest Figure 1  Name of female 2  Date back to work 2  Type of work 0  State she lived in 2  Total score 19    Cranial nerves: There is good facial symmetry with minimal facial hypomimia. The speech is fluent  and clear. Soft palate rises symmetrically and there is no tongue deviation. Hearing is intact to conversational tone. Sensation: Sensation is intact to light touch throughout Motor: Strength is at least antigravity x4.  Movement examination: Tone: There is normal tone in the UE/LE Abnormal movements: there is mild LLE dyskinesia Coordination:  There is minimal decremation with RAM's, with hand opening and closing on the L.  Most RAMs are good Gait and Station: The patient has no difficulty arising out of a deep-seated chair without the use of the hands. The patient's stride length is good but just slightly off balance upon first arising.      ASSESSMENT/PLAN: 1.  Parkinson's disease  -Continue carbidopa/levodopa  25/100, 1 tablet at 6 AM/10 AM/2 PM/6 PM  -Take carbidopa/levodopa 50/200 at bedtime.  Reminded her again that when she stops this, each time she has gotten terrible cramping in the legs.  -Discussed the pramipexole.  I think that we may need to decrease this.  I think we are having some memory change.  I am going to go ahead and drop that from 1.5 mg 3 times per day to 1 mg 3 times per day.  -Patient does have Parkinson's dyskinesia.  In the past, she really has not wanted anything for it.  I do not want to add anything for it right now because that could increase confusion.  -hold driving  -continue PT  -suspect may be developing PDD.  Discussed all meds be monitored.  Discussed may do neurocog testing in the future.  Want to change meds first, including pramipexole 2.  B12 deficiency  -On oral supplementation 3.  RBD  -Mild and not bothersome.  Does not want any medications 4.  Left arm/leg paresthesias  -Intermittent for months.  MRI is pending.  I am not sure why this is not been done.  States that authorization is pending.  I will try to have my office investigate that.  -do EEG 5.  F/u 2-3 months  Total time spent on today's visit was greater than 45 minutes, including both face-to-face time and nonface-to-face time.  Time included that spent on review of records (prior notes available to me/labs/imaging if pertinent), discussing treatment and goals, answering patient's questions and coordinating care.  Cc:  Sheliah Hatch, MD

## 2019-04-25 ENCOUNTER — Other Ambulatory Visit: Payer: Self-pay

## 2019-04-25 ENCOUNTER — Ambulatory Visit (INDEPENDENT_AMBULATORY_CARE_PROVIDER_SITE_OTHER): Payer: Medicare HMO | Admitting: Neurology

## 2019-04-25 ENCOUNTER — Encounter: Payer: Self-pay | Admitting: Neurology

## 2019-04-25 VITALS — BP 130/70 | HR 78 | Resp 16 | Ht 65.0 in | Wt 181.0 lb

## 2019-04-25 DIAGNOSIS — R209 Unspecified disturbances of skin sensation: Secondary | ICD-10-CM

## 2019-04-25 DIAGNOSIS — R202 Paresthesia of skin: Secondary | ICD-10-CM | POA: Diagnosis not present

## 2019-04-25 DIAGNOSIS — G9340 Encephalopathy, unspecified: Secondary | ICD-10-CM

## 2019-04-25 DIAGNOSIS — R413 Other amnesia: Secondary | ICD-10-CM | POA: Diagnosis not present

## 2019-04-25 DIAGNOSIS — G20 Parkinson's disease: Secondary | ICD-10-CM | POA: Diagnosis not present

## 2019-04-25 NOTE — Patient Instructions (Addendum)
1.  Continue carbidopa/levodopa 25/100, 1 tablet at 6 AM/10 AM/2 PM/6  2.  Continue carbidopa/levodopa 50/200 at bedtime 3.  DECREASE pramipexole to 1.0 mg three times per day (currently you are taking 1.5 tablets three times per day so you will no longer cut this in half and you will take a full tablet three times per day) 4.  I will find out what happened with your MRI.  Call me if you don't hear from them 5.  I will get your EEG scheduled at my office 6.  Hold on driving for now 7.  Keep doing PT 8.  We may consider neurocognitive (memory) testing in the future but I want to wean/change meds first.   9.  meds should be monitored/administered with family

## 2019-04-28 ENCOUNTER — Telehealth: Payer: Self-pay

## 2019-04-28 NOTE — Telephone Encounter (Signed)
Pt was given the number to [facility] to call and get MRI scheduled

## 2019-04-30 ENCOUNTER — Ambulatory Visit (INDEPENDENT_AMBULATORY_CARE_PROVIDER_SITE_OTHER): Payer: Medicare HMO | Admitting: Neurology

## 2019-04-30 ENCOUNTER — Other Ambulatory Visit: Payer: Self-pay

## 2019-04-30 DIAGNOSIS — R9401 Abnormal electroencephalogram [EEG]: Secondary | ICD-10-CM

## 2019-04-30 DIAGNOSIS — R209 Unspecified disturbances of skin sensation: Secondary | ICD-10-CM | POA: Diagnosis not present

## 2019-04-30 DIAGNOSIS — G9340 Encephalopathy, unspecified: Secondary | ICD-10-CM

## 2019-05-01 ENCOUNTER — Ambulatory Visit: Payer: Medicare HMO | Admitting: Neurology

## 2019-05-02 ENCOUNTER — Ambulatory Visit
Admission: RE | Admit: 2019-05-02 | Discharge: 2019-05-02 | Disposition: A | Discharge status: Home / Self Care | Payer: Medicare HMO | Source: Ambulatory Visit | Attending: Neurology | Admitting: Neurology

## 2019-05-02 ENCOUNTER — Other Ambulatory Visit: Payer: Self-pay

## 2019-05-02 DIAGNOSIS — R202 Paresthesia of skin: Secondary | ICD-10-CM | POA: Diagnosis not present

## 2019-05-02 DIAGNOSIS — R410 Disorientation, unspecified: Secondary | ICD-10-CM | POA: Diagnosis not present

## 2019-05-02 NOTE — Procedures (Signed)
TECHNICAL SUMMARY:  A multichannel referential and bipolar montage EEG using the standard international 10-20 system was performed on the patient described as awake and asleep.  The dominant background activity consists of 7 hertz activity seen most prominantly over the posterior head region.  5-6 hertz activity can be seen intermixed.  Low voltage fast (beta) activity is distributed symmetrically and maximally over the anterior head regions.  ACTIVATION:  Stepwise photic stimulation at 4-20 flashes per second was performed and did not elicit any abnormal waveforms.  Hyperventilation was not performed.  EPILEPTIFORM ACTIVITY:  There were no spikes, sharp waves or paroxysmal activity.  SLEEP: Stage I and stage II sleep are noted.   IMPRESSION:  This is an abnormal EEG demonstrating a mild diffuse slowing of electrocerebral activity.  This can be seen in a wide variety of encephalopathic state including those of a toxic, metabolic, or degenerative nature.  There were no focal, hemispheric, or lateralizing features.  No epileptiform activity was recorded.

## 2019-05-05 DIAGNOSIS — G20 Parkinson's disease: Secondary | ICD-10-CM | POA: Diagnosis not present

## 2019-05-07 DIAGNOSIS — G20 Parkinson's disease: Secondary | ICD-10-CM | POA: Diagnosis not present

## 2019-05-12 DIAGNOSIS — G20 Parkinson's disease: Secondary | ICD-10-CM | POA: Diagnosis not present

## 2019-05-19 DIAGNOSIS — G20 Parkinson's disease: Secondary | ICD-10-CM | POA: Diagnosis not present

## 2019-05-21 DIAGNOSIS — H401134 Primary open-angle glaucoma, bilateral, indeterminate stage: Secondary | ICD-10-CM | POA: Diagnosis not present

## 2019-05-21 DIAGNOSIS — H547 Unspecified visual loss: Secondary | ICD-10-CM

## 2019-05-21 DIAGNOSIS — H5350 Unspecified color vision deficiencies: Secondary | ICD-10-CM

## 2019-05-21 DIAGNOSIS — H353132 Nonexudative age-related macular degeneration, bilateral, intermediate dry stage: Secondary | ICD-10-CM | POA: Diagnosis not present

## 2019-05-21 HISTORY — DX: Unspecified visual loss: H54.7

## 2019-05-21 HISTORY — DX: Unspecified color vision deficiencies: H53.50

## 2019-05-26 DIAGNOSIS — G20 Parkinson's disease: Secondary | ICD-10-CM | POA: Diagnosis not present

## 2019-05-29 DIAGNOSIS — G20 Parkinson's disease: Secondary | ICD-10-CM | POA: Diagnosis not present

## 2019-06-03 DIAGNOSIS — G20 Parkinson's disease: Secondary | ICD-10-CM | POA: Diagnosis not present

## 2019-06-03 DIAGNOSIS — H547 Unspecified visual loss: Secondary | ICD-10-CM | POA: Diagnosis not present

## 2019-06-04 DIAGNOSIS — H353121 Nonexudative age-related macular degeneration, left eye, early dry stage: Secondary | ICD-10-CM | POA: Diagnosis not present

## 2019-06-04 DIAGNOSIS — H35373 Puckering of macula, bilateral: Secondary | ICD-10-CM | POA: Diagnosis not present

## 2019-06-04 DIAGNOSIS — Q141 Congenital malformation of retina: Secondary | ICD-10-CM | POA: Diagnosis not present

## 2019-06-04 DIAGNOSIS — H43813 Vitreous degeneration, bilateral: Secondary | ICD-10-CM | POA: Diagnosis not present

## 2019-06-13 DIAGNOSIS — R930 Abnormal findings on diagnostic imaging of skull and head, not elsewhere classified: Secondary | ICD-10-CM | POA: Diagnosis not present

## 2019-06-13 DIAGNOSIS — G319 Degenerative disease of nervous system, unspecified: Secondary | ICD-10-CM | POA: Diagnosis not present

## 2019-06-13 DIAGNOSIS — I6782 Cerebral ischemia: Secondary | ICD-10-CM | POA: Diagnosis not present

## 2019-06-13 DIAGNOSIS — H547 Unspecified visual loss: Secondary | ICD-10-CM | POA: Diagnosis not present

## 2019-06-24 DIAGNOSIS — G20 Parkinson's disease: Secondary | ICD-10-CM | POA: Diagnosis not present

## 2019-06-26 DIAGNOSIS — G20 Parkinson's disease: Secondary | ICD-10-CM | POA: Diagnosis not present

## 2019-07-02 DIAGNOSIS — G20 Parkinson's disease: Secondary | ICD-10-CM | POA: Diagnosis not present

## 2019-07-03 NOTE — Progress Notes (Signed)
Assessment/Plan:   1.  Parkinsons Disease  -Continue carbidopa/levodopa 25/100, 1 tablet 4 times per day  -Continue carbidopa/levodopa 50/200 at bedtime for the cramping.  -decrease Pramipexole to 0.5 mg, 2 in the AM, 1 in the afternoon, 1 in the evening (titration schedule given for how to do that)  2.  Parkinson's dyskinesia  -Not that bothersome to the patient.  Have decided not to start anything for it now, primarily because of increasing confusion.  3.  Memory change  -Hold driving.  -We are decreasing pramipexole.  -we will send for neurocognitive testing.    4.  B12 deficiency  -On oral supplementation.  5.  RBD  -Not on any medications currently.  Bedroom safety discussed.  6.  Follow up is anticipated in the next 4-6 months, sooner should new neurologic issues arise.   Subjective:   Carly [name] was seen today in follow up for Parkinsons disease.  My previous records were reviewed prior to todays visit as well as outside records available to me.  I decreased her pramipexole somewhat last visit, primarily because of some cognitive change.  .we did do an EEG because of sensory change that she has been describing on the left side of her body.  This was abnormal, but just demonstrated a mild diffuse slowing of electrocerebral activity, and did not explain the changes that she was having.  She also had an MRI of the brain that I personally reviewed.  There was mild white matter disease.  Otherwise was unremarkable and nonacute.  In regards to the left arm and leg paresthesias, she reports that this has resolved.  She states that she does not have neck pain.  Pt denies falls.  [facility].  Pt denies lightheadedness, near syncope.  No hallucinations but she has visual distortions.  Mood has been good.  She has seen neuroophthalmology at [facility] and those records are reviewed.  She had updated MRI's and in the end, it was felt that her VF testing was skewed  by the fact she had Parkinsons Disease.    Current prescribed movement disorder medications: Carbidopa/levodopa 25/100, 1 tablet at 6 AM/10 AM/2 PM/6 PM Carbidopa/levodopa 50/200 at bedtime Pramipexole, 1 mg 3 times per day (decreased from 1.5 mg 3 times daily last visit)   ALLERGIES:   Allergies  Allergen Reactions  . Codeine Nausea Only and Other (See Comments)    hallucinations    CURRENT MEDICATIONS:  Outpatient Encounter Medications as of 07/07/2019  Medication Sig  . carbidopa-levodopa (SINEMET IR) 25-100 MG tablet TAKE 1 TABLET BY MOUTH 4 TIMES DAILY  . dorzolamide-timolol (COSOPT) 22.3-6.8 MG/ML ophthalmic solution Apply to eye.  . latanoprost (XALATAN) 0.005 % ophthalmic solution Place 1 drop into both eyes at bedtime.   . pramipexole (MIRAPEX) 1 MG tablet Take  1.5 tablets by mouth three times a day  . ALPRAZolam (XANAX) 0.5 MG tablet Take 1 tablet (0.5 mg total) by mouth 2 (two) times daily as needed for anxiety. (Patient not taking: Reported on 07/07/2019)  . meloxicam (MOBIC) 15 MG tablet Take 1 tablet (15 mg total) by mouth daily. (Patient not taking: Reported on 07/07/2019)  . [DISCONTINUED] carbidopa-levodopa (SINEMET CR) 50-200 MG tablet Take 1 tablet by mouth at bedtime.   No facility-administered encounter medications on file as of 07/07/2019.    Objective:   PHYSICAL EXAMINATION:    VITALS:   Vitals:   07/07/19 1459  BP: (!) 155/70  Pulse: Marland Kitchen)  54  SpO2: 96%  Weight: 182 lb 3.2 oz (82.6 kg)  Height: 5\' 5"  (1.651 m)    GEN:  The patient appears stated age and is in NAD. HEENT:  Normocephalic, atraumatic.  The mucous membranes are moist. The superficial temporal arteries are without ropiness or tenderness. CV:  RRR Lungs:  CTAB Neck/HEME:  There are no carotid bruits bilaterally.  Neurological examination:  Orientation: The patient is alert and oriented x3. Cranial nerves: There is good facial symmetry with facial hypomimia. The speech is fluent and clear.  Soft palate rises symmetrically and there is no tongue deviation. Hearing is intact to conversational tone. Sensation: Sensation is intact to light touch throughout Motor: Strength is at least antigravity x4.  Movement examination: Tone: There is normal tone in the UE/LE Abnormal movements: there is dyskinesia in the R leg Coordination:  There is no decremation with RAM's, Gait and Station: The patient has no difficulty arising out of a deep-seated chair without the use of the hands. The patient's stride length is antalgic.  On the way out, she trips over the carpet/door jam due to dragging the leg but she catches herself easily.     Total time spent on today's visit was 30 minutes, including both face-to-face time and nonface-to-face time.  Time included that spent on review of records (prior notes available to me/labs/imaging if pertinent), discussing treatment and goals, answering patient's questions and coordinating care.  Cc:  Midge Minium, MD

## 2019-07-07 ENCOUNTER — Encounter: Payer: Self-pay | Admitting: Neurology

## 2019-07-07 ENCOUNTER — Ambulatory Visit: Payer: Medicare HMO | Admitting: Neurology

## 2019-07-07 ENCOUNTER — Other Ambulatory Visit: Payer: Self-pay

## 2019-07-07 VITALS — BP 155/70 | HR 54 | Ht 65.0 in | Wt 182.2 lb

## 2019-07-07 DIAGNOSIS — G20 Parkinson's disease: Secondary | ICD-10-CM | POA: Diagnosis not present

## 2019-07-07 DIAGNOSIS — R413 Other amnesia: Secondary | ICD-10-CM | POA: Diagnosis not present

## 2019-07-07 MED ORDER — PRAMIPEXOLE DIHYDROCHLORIDE 0.5 MG PO TABS: ORAL_TABLET | ORAL | 1 refills | Status: DC

## 2019-07-07 NOTE — Patient Instructions (Addendum)
Stop pramipexole 1mg  dosage that you have at home  Week 1: Start pramipexole 0.5 mg, 2 in the AM, 2 in the afternoon and 1 in the evening  Week 2 and thereafter: Pramipexole 0.5 mg, 2 in the AM, 1 in the afternoon and 1 in the evening  You have been referred for a neurocognitive evaluation in our office.   The evaluation has two parts.   . The first part of the evaluation is a clinical interview with the neuropsychologist (Dr. or Dr. Milbert Coulter). Please bring someone with you to this appointment if possible, as it is helpful for the doctor to hear from both you and another adult who knows you well.   . The second part of the evaluation is testing with the doctor's technician Roseanne Reno or Annabelle Harman). The testing includes a variety of tasks- mostly question-and-answer, some paper-and-pencil. There is nothing you need to do to prepare for this appointment, but having a good night's sleep prior to the testing, taking medications as you normally would, and bringing eyeglasses and hearing aids (if you wear them), is advised. Please make sure that you wear a mask to the appointment.  Please note: We have to reserve several hours of the neuropsychologist's time and the psychometrician's time for your evaluation appointment. As such, please note that there is a No-Show fee of $100. If you are unable to attend any of your appointments, please contact our office as soon as possible to reschedule.

## 2019-07-10 DIAGNOSIS — G20 Parkinson's disease: Secondary | ICD-10-CM | POA: Diagnosis not present

## 2019-07-11 ENCOUNTER — Other Ambulatory Visit: Payer: Self-pay | Admitting: Family Medicine

## 2019-07-11 DIAGNOSIS — Z1231 Encounter for screening mammogram for malignant neoplasm of breast: Secondary | ICD-10-CM

## 2019-07-18 DIAGNOSIS — H401134 Primary open-angle glaucoma, bilateral, indeterminate stage: Secondary | ICD-10-CM | POA: Diagnosis not present

## 2019-07-18 DIAGNOSIS — H353122 Nonexudative age-related macular degeneration, left eye, intermediate dry stage: Secondary | ICD-10-CM | POA: Diagnosis not present

## 2019-07-18 DIAGNOSIS — H52222 Regular astigmatism, left eye: Secondary | ICD-10-CM | POA: Diagnosis not present

## 2019-07-18 DIAGNOSIS — H25012 Cortical age-related cataract, left eye: Secondary | ICD-10-CM | POA: Diagnosis not present

## 2019-07-21 ENCOUNTER — Ambulatory Visit: Payer: Medicare HMO

## 2019-07-25 DIAGNOSIS — H401134 Primary open-angle glaucoma, bilateral, indeterminate stage: Secondary | ICD-10-CM | POA: Diagnosis not present

## 2019-08-19 ENCOUNTER — Telehealth: Payer: Self-pay | Admitting: Neurology

## 2019-08-19 NOTE — Telephone Encounter (Signed)
She will need to call her PCP about that.  That is out of my realm

## 2019-08-19 NOTE — Telephone Encounter (Signed)
Patient called in and asked if Dr. Arbutus Leas would be able to prescribe her anything for anxiety. She stated she felt like she was going to "jump out of her skin".

## 2019-08-20 ENCOUNTER — Telehealth: Payer: Self-pay | Admitting: General Practice

## 2019-08-20 NOTE — Telephone Encounter (Signed)
Patient notified of Dr Tat's recommendations and voiced understanding.  °

## 2019-08-20 NOTE — Telephone Encounter (Signed)
Agree w/ advice given 

## 2019-08-20 NOTE — Telephone Encounter (Signed)
Called and spoke with pt. She advised that she feels like she is having an anxiety attack. She is alternating hot and cold flashes and feels like she has a smothered sensation. She denies any SOB, Chest pain, HA, or dizziness. She thinks that it has been caused from her Parkinson's Flaring. She has an appt with her Neurology doctor coming up.   Pt was advised that if symptoms change or worsen that she should seek evaluation at the ER. Pt was made an appt for tomorrow at 10am.

## 2019-08-21 ENCOUNTER — Encounter: Payer: Self-pay | Admitting: Family Medicine

## 2019-08-21 ENCOUNTER — Other Ambulatory Visit: Payer: Self-pay

## 2019-08-21 ENCOUNTER — Telehealth (INDEPENDENT_AMBULATORY_CARE_PROVIDER_SITE_OTHER): Payer: Medicare HMO | Admitting: Family Medicine

## 2019-08-21 DIAGNOSIS — F411 Generalized anxiety disorder: Secondary | ICD-10-CM | POA: Diagnosis not present

## 2019-08-21 DIAGNOSIS — R69 Illness, unspecified: Secondary | ICD-10-CM | POA: Diagnosis not present

## 2019-08-21 MED ORDER — ALPRAZOLAM 0.5 MG PO TABS: 0.5000 mg | ORAL_TABLET | Freq: Two times a day (BID) | ORAL | 1 refills | Status: DC | PRN

## 2019-08-21 MED ORDER — CITALOPRAM HYDROBROMIDE 10 MG PO TABS
10.0000 mg | ORAL_TABLET | Freq: Every day | ORAL | 3 refills | Status: DC
Start: 2019-08-21 — End: 2020-03-19

## 2019-08-21 NOTE — Progress Notes (Signed)
I have discussed the procedure for the virtual visit with the patient who has given consent to proceed with assessment and treatment.   Pt unable to obtain vitals.   Scored 4 on depression  Geannie Risen, CMA

## 2019-08-21 NOTE — Progress Notes (Signed)
   Virtual Visit via Video   I connected with patient on 08/21/19 at 10:00 AM EDT by a video enabled telemedicine application and verified that I am speaking with the correct person using two identifiers.  Location patient: Home Location provider: [facility], Office Persons participating in the virtual visit: Patient, Provider, CMA (Jess B)  I discussed the limitations of evaluation and management by telemedicine and the availability of in person appointments. The patient expressed understanding and agreed to proceed.  Subjective:   HPI:   Anxiety- pt has hx of this but reports it has worsened recently.  Had a panic attack yesterday- cold sweats and felt 'overwhelmed'.  Has been unable to focus, 'I can't get anything done'.  Sister reports 'it doesn't take much to bring this on'.  Pt reports she is sleeping well but will wake at 3-4am and thoughts start racing.  Was previously on Citalopram and Sertraline.  Has Alprazolam on her med list but has not been taking.  ROS:   See pertinent positives and negatives per HPI.  Patient Active Problem List   Diagnosis Date Noted  . Color vision defect 05/21/2019  . Decreased visual acuity 05/21/2019  . Vitamin D deficiency 11/07/2017  . Primary osteoarthritis of left knee 05/21/2017  . Degenerative arthritis of left knee 05/18/2017  . Anterior cervical lymphadenopathy 05/14/2015  . Osteopenia 02/15/2015  . Physical exam 02/09/2014  . B12 deficiency 01/01/2014  . Bronchitis 04/12/2011  . ACHILLES TENDINITIS 01/31/2010  . PARKINSON'S DISEASE 07/08/2008  . TREMOR, RIGHT HAND 04/07/2008  . Anxiety state 01/31/2007  . HTN (hypertension) 01/31/2007    Social History   Tobacco Use  . Smoking status: Never Smoker  . Smokeless tobacco: Never Used  Substance Use Topics  . Alcohol use: No    Current Outpatient Medications:  .  carbidopa-levodopa (SINEMET IR) 25-100 MG tablet, TAKE 1 TABLET BY MOUTH 4 TIMES DAILY, Disp: 360 tablet,  Rfl: 1 .  dorzolamide-timolol (COSOPT) 22.3-6.8 MG/ML ophthalmic solution, Apply to eye., Disp: , Rfl:  .  latanoprost (XALATAN) 0.005 % ophthalmic solution, Place 1 drop into both eyes at bedtime. , Disp: , Rfl:  .  pramipexole (MIRAPEX) 0.5 MG tablet, 2 in the AM, 1 in the afternoon and 1 in the evening, Disp: 360 tablet, Rfl: 1 .  ALPRAZolam (XANAX) 0.5 MG tablet, Take 1 tablet (0.5 mg total) by mouth 2 (two) times daily as needed for anxiety. (Patient not taking: Reported on 07/07/2019), Disp: 30 tablet, Rfl: 1  Allergies  Allergen Reactions  . Codeine Nausea Only and Other (See Comments)    hallucinations    Objective:   There were no vitals taken for this visit. AAOx3, NAD NCAT, EOMI No obvious CN deficits Coloring WNL Pt is able to speak clearly, coherently without shortness of breath or increased work of breathing.  Thought process is linear.  Mood is appropriate.   Assessment and Plan:   Anxiety- deteriorated.  Restart Citalopram at low dose and use Alprazolam as needed for high anxiety moments.  Encouraged her to talk about her feelings with those around her and if that is not helping to consider counseling.  Will follow closely and adjust meds prn.  Pt expressed understanding and is in agreement w/ plan.   [name], MD 08/21/2019

## 2019-08-22 ENCOUNTER — Encounter: Payer: Medicare HMO | Admitting: Psychology

## 2019-08-26 ENCOUNTER — Telehealth: Payer: Self-pay | Admitting: Family Medicine

## 2019-08-26 NOTE — Telephone Encounter (Signed)
Left voice message for patient to call us back to set up a 1 month follow up visit for anxiety - this visit can be a virtual visit

## 2019-09-03 ENCOUNTER — Encounter: Payer: Medicare HMO | Admitting: Psychology

## 2019-09-29 ENCOUNTER — Ambulatory Visit: Payer: Medicare HMO | Admitting: Psychology

## 2019-09-29 ENCOUNTER — Other Ambulatory Visit: Payer: Self-pay

## 2019-09-29 ENCOUNTER — Encounter: Payer: Self-pay | Admitting: Psychology

## 2019-09-29 ENCOUNTER — Ambulatory Visit (INDEPENDENT_AMBULATORY_CARE_PROVIDER_SITE_OTHER): Payer: Medicare HMO | Admitting: Psychology

## 2019-09-29 DIAGNOSIS — G3184 Mild cognitive impairment, so stated: Secondary | ICD-10-CM | POA: Diagnosis not present

## 2019-09-29 DIAGNOSIS — G20 Parkinson's disease: Secondary | ICD-10-CM | POA: Diagnosis not present

## 2019-09-29 DIAGNOSIS — R4189 Other symptoms and signs involving cognitive functions and awareness: Secondary | ICD-10-CM

## 2019-09-29 NOTE — Progress Notes (Addendum)
NEUROPSYCHOLOGICAL EVALUATION [place]. [place] Department of Neurology  Date of Evaluation: September 29, 2019  Reason for Referral:   MILTON STREICHER is a 74 y.o. right-handed Caucasian female referred by Alonza Bogus, D.O., to characterize her current cognitive functioning and assist with diagnostic clarity and treatment planning in the context of Parkinson's disease and subjective cognitive decline.  Assessment and Plan:   Clinical Impression(s): Overall, Ms. Harbach's pattern of performance is suggestive of moderate frontal subcortical dysfunction with a prominent weakness across executive functioning. Additional weaknesses were exhibited across processing speed, working memory, phonemic fluency, receptive language, and both encoding (i.e., learning) and retrieval aspects of verbal and visual memory. Outside of her drawing of a clock, visuospatial tasks were within expectation. Performance was also intact across semantic fluency, confrontation naming, and recognition/consolidation aspects of memory. Ms. Wilcher largely denied difficulties completing instrumental activities of daily living (ADLs) independently. However, she did acknowledge decreased confidence surrounding financial management and some instances where she may forget to take medication dosages if busy or distracted. As such, given evidence for cognitive dysfunction described above, she meets criteria for a Mild Neurocognitive Disorder (formerly "mild cognitive impairment").  Regarding etiology, patterns suggesting frontal subcortical dysfunction is common in individuals with Parkinson's disease and this likely represents the primary cause for cognitive deficits. Given the extent of some deficits and the concern that they may be beginning to impact ADLs, I do have concerns surrounding the progression of these deficits resulting in a Parkinson's disease dementia presentation. While I cannot rule out a Lewy body  dementia presentation, visuospatial functioning was largely appropriate and she denied common behavioral characteristics of this condition, including fully formed visual hallucinations and fluctuations in alertness. She did report concerns surrounding a REM sleep disorder; however, this can also be seen in Parkinson's disease. Motor dysfunction was said to precede cognitive dysfunction by several years (she was diagnosed with Parkinson's disease in 2009), which is also inconsistent with the traditional Lewy body dementia timeline. However, while less likely, this should remain on her differential. There could also be a vascular contribution given neuroimaging suggesting mild to moderate small vessel ischemia. Despite weaknesses in learning and retrieval aspects of memory, Ms. Torti did not demonstrate an information storage deficit. This, coupled with appropriate semantic fluency and confrontation naming scores, is not suggestive of Alzheimer's disease.   Across mood questionnaires, she reported mild levels of anxiety and depression. She also scored in the "highly likely to have ADHD" and "likely to have ADHD" ranges across inattention and hyperactivity/impulsivity scales on an childhood ADHD questionnaire respectively. While this questionnaire is not diagnostic of ADHD, it suggests the presence of traits of this condition at the very least. This would be consistent with her report of longstanding attentional dysregulation. Continued medical monitoring will be important moving forward.   Recommendations: A repeat neuropsychological evaluation in 12-18 months (or sooner if functional decline is noted) is recommended to assess the trajectory of future cognitive decline should it occur. This will also aid in future efforts towards improved diagnostic clarity.  Should there be a progression of her current deficits over time, Ms. Qin is unlikely to regain any independent living skills lost. Therefore, it is  recommended that she remain as involved as possible in all aspects of household chores, finances, and medication management, with supervision to ensure adequate performance. She will likely benefit from the establishment and maintenance of a routine in order to maximize her functional abilities over time.  It will be  important for Ms. Riling to have another person with her when in situations where she may need to process information, weigh the pros and cons of different options, and make decisions, in order to ensure that she fully understands and recalls all information to be considered.  If not already done, Ms. Battiste and her family may want to discuss her wishes regarding durable power of attorney and medical decision making, so that she can have input into these choices. Additionally, they may wish to discuss future plans for caretaking and seek out community options for in home/residential care should they become necessary.  Ms. Butner is encouraged to attend to lifestyle factors for brain health (e.g., regular physical exercise, good nutrition habits, regular participation in cognitively-stimulating activities, and general stress management techniques), which are likely to have benefits for both emotional adjustment and cognition. In fact, in addition to promoting good general health, regular exercise incorporating aerobic activities (e.g., brisk walking, jogging, cycling, etc.) has been demonstrated to be a very effective treatment for depression and stress, with similar efficacy rates to both antidepressant medication and psychotherapy. Optimal control of vascular risk factors (including safe cardiovascular exercise and adherence to dietary recommendations) is encouraged.  When learning new information, she would benefit from information being broken up into small, manageable pieces. She may also find it helpful to articulate the material in her own words and in a context to promote encoding at  the onset of a new task. This material may need to be repeated multiple times to promote encoding. Important information should also be provided in written format to promote comprehension and later recall.  Memory can be improved using internal strategies such as rehearsal, repetition, chunking, mnemonics, association, and imagery. External strategies such as written notes in a consistently used memory journal, visual and nonverbal auditory cues such as a calendar on the refrigerator or appointments with alarm, such as on a cell phone, can also help maximize recall.    To address problems with processing speed, she may wish to consider:   -Scheduling more difficult activities for a time of day when she is usually most alert   -Ensuring that she is alerted when essential material or instructions are being presented   -Adjusting the speed at which new information is presented   -Allowing for more time in comprehending, processing, and responding in conversation  To address problems with executive dysfunction, she may wish to consider:   -Avoiding external distractions when needing to concentrate   -Limiting exposure to fast paced environments with multiple sensory demands   -Writing down complicated information and using checklists   -Attempting and completing one task at a time (i.e., no multi-tasking)   -Verbalizing aloud each step of a task to maintain focus   -Taking frequent breaks during the completion of steps/tasks to avoid fatigue   -Reducing the amount of information considered at one time  Review of Records:   Ms. Jacques was seen for an eye exam on 05/21/2019 Arville Go, M.D.). Diagnoses included an age-related nuclear cataract of the left eye, cortical age-related cataract of the left eye, and primary open angle glaucoma of both eyes, indeterminate stage. Her IOP was described as good; however, Dr. Loraine Grip reported rapidly and significantly worsening VF's despite. She was seen by [facility] Leonie Green, M.D.) on 06/03/2019. Her exam was said to show the following: VA 20/25 OU, full color plates, full CVF despite bilateral superior field defects noted on recent HVF, normal pupillary reactions, and full  EOM. Dilated exam revealed no pallor or edema and C/D ratio was slightly enlarged (OD>OS). Dr. Shellia Carwin noted that if the field defects noted on the recent HVF were real, she would have expected to reproduce them to some degree during confrontational testing. Given that patients with Parkinsonism have bradykinesia, she expressed concerned that results may represent artifact and not true defect. Review of the outside HVF performed in the summer 2020 revealed similar defects. In the setting of an otherwise normal exam, well-controlled IOP's, and a reassuring appearance of the optic nerves on bot dilated exam and OCT, she did not feel that these deficits could be produced by her glaucoma.   Ms. Foulk was seen by [facility] Lurena Joiner Tat, D.O.) on 07/07/2019 for follow-up of Parkinson's disease. Pramipexole was decreased primarily because of some reported cognitive changes/concerns. An EEG was performed due to her reporting a sensory change on the left side of her body. Results were abnormal; however, it demonstrated mild diffuse slowing of electrocerebral activity and did not explain changes she was having.  She reported resolving left arm and leg paresthesias. She denied ongoing neck pain, recent falls, lightheadedness, near syncope, or hallucinations. However, she did describe some visual distortions. Her mood was described as good.  Brain MRI on 05/02/2019 revealed mild to moderate chronic small vessel ischemia and age-related generalized atrophy.   Past Medical History:  Diagnosis Date  . Anemia   . Anterior cervical lymphadenopathy 05/14/2015  . B12 deficiency 01/01/2014  . Bronchitis 04/12/2011  . Color vision defect 05/21/2019  . Decreased visual acuity  05/21/2019  . Degenerative arthritis of left knee 05/18/2017  . DVT of lower extremity, bilateral    years ago   . Generalized anxiety disorder with panic attacks 01/31/2007  . HTN (hypertension) 01/31/2007  . Hypertension   . Osteopenia 02/15/2015  . Parkinson's disease   . Primary osteoarthritis of left knee 05/21/2017  . Vitamin D deficiency     Past Surgical History:  Procedure Laterality Date  . BREAST BIOPSY    . DILATION AND CURETTAGE OF UTERUS    . EYE SURGERY Right 05/21/2018  . KNEE SURGERY Left 01/2017  . REFRACTIVE SURGERY Bilateral   . TOTAL KNEE ARTHROPLASTY Left 05/21/2017   Procedure: TOTAL KNEE ARTHROPLASTY;  Surgeon: Gean Birchwood, MD;  Location: [facility] OR;  Service: Orthopedics;  Laterality: Left;    Current Outpatient Medications:  .  ALPRAZolam (XANAX) 0.5 MG tablet, Take 1 tablet (0.5 mg total) by mouth 2 (two) times daily as needed for anxiety., Disp: 30 tablet, Rfl: 1 .  carbidopa-levodopa (SINEMET IR) 25-100 MG tablet, TAKE 1 TABLET BY MOUTH 4 TIMES DAILY, Disp: 360 tablet, Rfl: 1 .  citalopram (CELEXA) 10 MG tablet, Take 1 tablet (10 mg total) by mouth daily., Disp: 30 tablet, Rfl: 3 .  dorzolamide-timolol (COSOPT) 22.3-6.8 MG/ML ophthalmic solution, Apply to eye., Disp: , Rfl:  .  latanoprost (XALATAN) 0.005 % ophthalmic solution, Place 1 drop into both eyes at bedtime. , Disp: , Rfl:  .  pramipexole (MIRAPEX) 0.5 MG tablet, 2 in the AM, 1 in the afternoon and 1 in the evening, Disp: 360 tablet, Rfl: 1  Clinical Interview:   Cognitive Symptoms: Decreased short-term memory: Endorsed. She reported difficulties remembering the names of familiar individuals (including family members) and misplacing objects around her home; the latter was described as longstanding in nature. She also reported one recent instance where she forgot to turn off the stove after removing food. Deficits were said to be  more noticeable over the past few months and seemed to have worsened over  that time.  Decreased long-term memory: Denied. Decreased attention/concentration: Endorsed. She described longstanding deficits with sustained attention, distractibility, and impulsivity, noting that they likely dated back many years. She noted that these deficits seemed to have worsened lately.  Reduced processing speed: Endorsed. Difficulties with executive functions: Endorsed. She reported longstanding difficulties with organization. She also endorsed poor decision making but was unable to recall any examples. Overt personality changes were denied.  Difficulties with emotion regulation: Denied. Difficulties with receptive language: Denied. Difficulties with word finding: Endorsed. Decreased visuoperceptual ability: Denied.  Trajectory of deficits: As stated above, a majority of deficits were said to be longstanding in nature, with the potential for decline occurring over the past few months. She reported exhibiting symptoms of Parkinson's disease for many years prior, suggesting that motor/movement symptoms long preceded cognitive dysfunction.    Difficulties completing ADLs: Largely denied. However, she did acknowledge instances where she will become busy and forget to take her medications in the morning. She also alluded to feeling less confident in her recall of if she previously paid a bill. She is not driving at the moment. This was said to be due to Dr. Arbutus Leas starting her on a new medication and her being advised to not drive in case of side effects. She was driving without issue prior to medication adjustments.   Additional Medical History: History of traumatic brain injury/concussion: Unclear. She reported being previously told that she was dropped on her head during early childhood. However, she has no recollection of this and noted that her mother did not either.  History of stroke: Denied. History of seizure activity: Denied. History of known exposure to toxins: Denied. Symptoms of  chronic pain: Endorsed. She described chronic left knee discomfort. However, she noted that she is currently not in a large amount of pain and that it does not seem limiting or debilitating.  Experience of frequent headaches/migraines: Denied. While she acknowledged a remote history of migraine headaches, no symptoms were said to be present for many years.  Frequent instances of dizziness/vertigo: Denied.  Sensory changes: Ongoing visual field/acuity deficits were described above. She reported the presence of "jittery" sensations in her eyes which can impact her ability to see (e.g., cannot read adequately). She alluded to vision difficulties being present for upwards of 18 years and at one point linked these symptoms to when she first observed symptoms of Parkinson's disease (records suggest she was diagnosed with Parkinson's disease in 2009). Gaze palsy was not observed. She reported the potential for mild hearing loss but has not been formally evaluated. She also reported the potential for a diminished sense of smell.  Balance/coordination difficulties: Endorsed. She described her balance as "kinda iffy," noting that her left side feels weaker and less stable (likely due to her history of osteoarthritis in her knee). She denied a history of recent falls and ambulates with a cane as a precautionary measure.  Other motor difficulties: Endorsed. She reported significant symptoms of dyskinesia which largely affects the upper body. Symptoms were said to worsen with increased stress/anxiety. She largely denied the presence of tremulous behaviors in her extremities.   Sleep History: Estimated hours obtained each night: 8-9 hours. Difficulties falling asleep: Denied. Difficulties staying asleep: Largely denied. She reported occasionally needing to use the restroom or waking up for unknown reasons; however, she largely denied difficulties falling back asleep.  Feels rested and refreshed upon awakening:  Endorsed.  History  of snoring: Endorsed. History of waking up gasping for air: Denied. Witnessed breath cessation while asleep: Denied.  History of vivid dreaming: Denied. Excessive movement while asleep: Endorsed. Instances of acting out her dreams: Endorsed. She reported infrequent instances where she will act out her dreams. She described a potential link between watching something violent on television immediately before bed and the presence of these actions.   Psychiatric/Behavioral Health History: Depression: Endorsed. While she acknowledged a history of depression, this largely centered around divorce proceedings many years prior. She described her current mood as "pretty good." She denied previously working with an Building services engineer. She likewise denied current or remote suicidal ideation, intent, or plan.  Anxiety: Endorsed. Around the time of her divorce, she reported significant anxiety symptoms along with the presence of panic attacks. She noted that these symptoms have diminished since that time and that panic attacks "aren't really a problem anymore." She acknowledged taking anti-anxiety medication with positive benefit.  Mania: Denied. Trauma History: Denied. Visual/auditory hallucinations: Denied. However, she described the presence of black spots in her visual field. Fully-formed hallucinations were denied.  Delusional thoughts: Denied.  Tobacco: Denied. Alcohol: She denied current alcohol consumption as well as a history of problematic alcohol abuse or dependence.  Recreational drugs: Denied. Caffeine: Denied.  Family History: Problem Relation Age of Onset  . Heart attack Father   . Heart failure Mother   . Cancer Brother        brain  . Cancer Brother        brain  . Healthy Sister   . Stroke Sister   . Arthritis Son    This information was confirmed by Ms. Rondon.  Academic/Vocational History: Highest level of educational attainment: 12 years. She  graduated from high school and described herself as an average (C) student in academic settings. 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  History of developmental delay: Denied. History of grade repetition: Denied. History of class failures: Denied. Enrollment in special education courses: Denied. History of LD/ADHD: She denied ever being formally diagnosed with ADHD. However, she did describe longstanding symptoms of inattention, distractibility, disorganization, and impulsivity, said to date back many years.   Employment: Retired. She previously worked in Sport and exercise psychologist.  Evaluation Results:   Behavioral Observations: Ms. Ra was unaccompanied, arrived to her appointment on time, and was appropriately dressed and groomed. She appeared alert and oriented. Her gait was somewhat unsteady and she favored her left knee while walking. She walked with a cane but did not use it for external stabilization, commenting that she carried one with her as a precaution. Tremulous behaviors were not observed. However, she did exhibit notable dyskinesia affecting her upper body throughout the interview and testing procedures. Her affect was generally relaxed and positive, but did range appropriately given the subject being discussed during the clinical interview or the task at hand during testing procedures. She also acknowledged being nervous about the current evaluation. Spontaneous speech was fluent and word finding difficulties were not observed during the clinical interview or testing procedures. Thought processes were coherent, organized, and normal in content. Insight into her cognitive difficulties appeared adequate; however, they likely underestimate the length of ongoing cognitive decline. During testing, sustained attention was appropriate. Task engagement was adequate and she persisted when challenged. She had some trouble comprehending more complex instructions (TMT B,  D-KEFS). One task (WCST) was not attempted due to increasing fatigue towards the end of the evaluation. Gaze palsy was not observed. Overall, Ms.  Decuir was cooperative with the clinical interview and subsequent testing procedures.   Adequacy of Effort: The validity of neuropsychological testing is limited by the extent to which the individual being tested may be assumed to have exerted adequate effort during testing. Ms. Gronewold expressed her intention to perform to the best of her abilities and exhibited adequate task engagement and persistence. Scores across stand-alone and embedded performance validity measures were within expectation. As such, the results of the current evaluation are believed to be a valid representation of Ms. Lesh's current cognitive functioning.  Test Results: Ms. Oo was largely oriented at the time of the current evaluation. Points were lost for her stating her incorrect age ("74").  Intellectual abilities based upon educational and vocational attainment were estimated to be in the average range. Premorbid abilities were estimated to be within the below average range based upon a single-word reading test.   Processing speed was exceptionally low to below average. Basic attention was average. More complex attention (e.g., working memory) was well below average. Executive functioning was exceptionally low, representing a primary weakness across the current evaluation.  Assessed receptive language abilities were well below average and she seemed to have increased difficulties when tasks required increased verbal working memory demands, as well as comprehension of more complex sentence structure. Assessed expressive language (e.g., verbal fluency and confrontation naming) was variable. Phonemic fluency was exceptionally low, semantic fluency was below average, and confrontation naming was above average.     Assessed visuospatial/visuoconstructional abilities were  variable, ranging from the impaired to average normative ranges. Points were lost on her drawing of a clock due to her only including the numbers 1-6 and 10-12 (as well as the number 11 twice) as well as being unable to draw the clock hands. Points were lost on her copy of a complex figure due to mildly distorted spatial properties of internal aspects.    Learning (i.e., encoding) of novel verbal and visual information was variable, ranging from the exceptionally low to average normative ranges. Spontaneous delayed recall (i.e., retrieval) of previously learned information was well below average to average. Retention rates were 100% across a story learning task, 50% across a list learning task, and 80% across a shape learning task. Performance across recognition tasks was average, suggesting evidence for information consolidation.   Results of emotional screening instruments suggested that recent symptoms of generalized anxiety were in the mild range, while symptoms of depression were also within the mild range. A screening instrument assessing recent sleep quality suggested the presence of minimal sleep dysfunction. Responses across a childhood ADHD questionnaire scored in the "highly likely to have ADHD" range for inattention and in the "likely to have ADHD" range for hyperactivity/impulsivity. Responses across a Parkinson's disease symptom scale suggested primary areas of difficulty surrounding cognitive dysfunction and bodily discomfort.   Tables of Scores:   Note: This summary of test scores accompanies the interpretive report and should not be considered in isolation without reference to the appropriate sections in the text. Descriptors are based on appropriate normative data and may be adjusted based on clinical judgment. The terms "impaired" and "within normal limits (WNL)" are used when a more specific level of functioning cannot be determined.       Effort Testing:   DESCRIPTOR       Dot  Counting Test: --- --- Within Expectation  NAB EVI: --- --- Within Expectation       Orientation:      Raw Score Percentile   NAB  Orientation, Form 1 28/29 --- ---       Intellectual Functioning:           Standard Score Percentile   Test of Premorbid Functioning: 81 10 Below Average       Memory:          NAB Memory Module, Form 1: Standard Score/ T Score Percentile   Total Memory Index 74 4 Well Below Average  List Learning       Total Trials 1-3 11/36 (30) 2 Well Below Average    List B 1/12 (31) 3 Well Below Average    Short Delay Free Recall 4/12 (38) 12 Below Average    Long Delay Free Recall 2/12 (33) 5 Well Below Average    Retention Percentage 50 (37) 9 Below Average    Recognition Discriminability 5 (45) 31 Average  Shape Learning       Total Trials 1-3 12/27 (44) 27 Average    Delayed Recall 4/9 (45) 31 Average    Retention Percentage 80 (44) 27 Average    Recognition Discriminability 6 (47) 38 Average  Story Learning       Immediate Recall 23/80 (23) <1 Exceptionally Low    Delayed Recall 15/40 (32) 4 Well Below Average    Retention Percentage 100 (53) 62 Average  Daily Living Memory       Immediate Recall 33/51 (39) 14 Below Average    Delayed Recall 13/17 (48) 42 Average    Retention Percentage 100 (60) 84 Above Average    Recognition Hits 8/10 (44) 27 Average       Attention/Executive Function:          Trail Making Test (TMT): Raw Score (T Score) Percentile     Part A 49 secs.,  1 error (41) 18 Below Average    Part B 271 secs.,  3 errors (27) 1 Exceptionally Low        Symbol Digit Modalities Test (SDMT): Raw Score (Z-Score) Percentile     Oral 27 (-1.33) 9 Below Average       NAB Attention Module, Form 1: T Score Percentile     Digits Forward 55 69 Average    Digits Backwards 33 5 Well Below Average       D-KEFS Color-Word Interference Test: Raw Score (Scaled Score) Percentile     Color Naming 50 secs. (3) 1 Exceptionally Low    Word Reading  51 secs. (1) <1 Exceptionally Low    Inhibition Discontinued --- Impaired    Inhibition/Switching Discontinued --- Impaired       D-KEFS 20 Questions Test: Scaled Score Percentile     Total Weighted Achievement Score Discontinued --- Impaired    Initial Abstraction Score --- --- ---       Language:          Verbal Fluency Test: Raw Score (T Score) Percentile     Phonemic Fluency (FAS) 16 (29) 2 Exceptionally Low    Animal Fluency 14 (41) 18 Below Average        NAB Language Module, Form 1: T Score Percentile     Auditory Comprehension 36 8 Well Below Average    Naming 30/31 (58) 79 Above Average       Visuospatial/Visuoconstruction:      Raw Score Percentile   Clock Drawing: 3/10 --- Impaired       NAB Spatial Module, Form 1: T Score Percentile     Visual Discrimination 39 14 Below Average  Chief Executive Officer 42 21 Below Average    Figure Drawing Copy 48 42 Average        Scaled Score Percentile   WAIS-IV Matrix Reasoning: 7 16 Below Average       Mood and Personality:      Raw Score Percentile   Geriatric Depression Scale: 15 --- Mild  Geriatric Anxiety Scale: 18 --- Mild    Somatic 7 --- Mild    Cognitive 6 --- Mild    Affective 5 --- Mild       Additional Questionnaires:          Adult Self-Report Scale (Childhood): Raw Score Percentile     Inattention 29 --- Highly likely to have ADHD    Hyperactive/Impulsive 23 --- Likely to have ADHD       PROMIS Sleep Disturbance Questionnaire: 18 --- None to Slight       Parkinson's Disease Questionnaire-39: Raw Score Percentile     Mobility 12 30 Mildly Affected    Activities of Daily Living 4 17 Mildly Affected    Emotional Well-Being 6 25 Mildly Affected    Stigma 3 19 Mildly Affected    Social Support 1 8 Minimally Affected    Cognitions 8 50 Moderately Affected    Communication 3 25 Mildly Affected    Bodily Discomfort 4 33 Moderately Affected   Informed Consent and Coding/Compliance:   Ms. Latka was  provided with a verbal description of the nature and purpose of the present neuropsychological evaluation. Also reviewed were the foreseeable risks and/or discomforts and benefits of the procedure, limits of confidentiality, and mandatory reporting requirements of this provider. The patient was given the opportunity to ask questions and receive answers about the evaluation. Oral consent to participate was provided by the patient.   This evaluation was conducted by Newman Nickels, Ph.D., licensed clinical neuropsychologist. Ms. Norment completed a comprehensive clinical interview with Dr. Milbert Coulter, billed as one unit [phone], and 165 minutes of cognitive testing and scoring, billed as one unit [phone] and five additional units [id number]. Psychometrist Wallace Keller, B.S., assisted Dr. Milbert Coulter with test administration and scoring procedures. As a separate and discrete service, Dr. Milbert Coulter spent a total of 180 minutes in interpretation and report writing billed as one unit [phone] and two units 96133.

## 2019-09-29 NOTE — Progress Notes (Signed)
   Psychometrician Note   Cognitive testing was administered to [name] by Wallace Keller, B.S. (psychometrist) under the supervision of Dr. Newman Nickels, Ph.D., licensed psychologist on 09/29/19. Ms. Gugel did not appear overtly distressed by the testing session per behavioral observation or responses across self-report questionnaires. Dr. Newman Nickels, Ph.D. checked in with Ms. Loughner as needed to manage any distress related to testing procedures (if applicable). Rest breaks were offered.    The battery of tests administered was selected by Dr. Newman Nickels, Ph.D. with consideration to Ms. Waddle's current level of functioning, the nature of her symptoms, emotional and behavioral responses during interview, level of literacy, observed level of motivation/effort, and the nature of the referral question. This battery was communicated to the psychometrist. Communication between Dr. Newman Nickels, Ph.D. and the psychometrist was ongoing throughout the evaluation and Dr. Newman Nickels, Ph.D. was immediately accessible at all times. Dr. Newman Nickels, Ph.D. provided supervision to the psychometrist on the date of this service to the extent necessary to assure the quality of all services provided.    [name] will return within approximately 1-2 weeks for an interactive feedback session with Dr. Milbert Coulter at which time her test performances, clinical impressions, and treatment recommendations will be reviewed in detail. Ms. Langworthy understands she can contact our office should she require our assistance before this time.  A total of 165 minutes of billable time were spent face-to-face with Ms. Mondor by the psychometrist. This includes both test administration and scoring time. Billing for these services is reflected in the clinical report generated by Dr. Newman Nickels, Ph.D..  This note reflects time spent with the psychometrician and does not include test scores or any  clinical interpretations made by Dr. Milbert Coulter. The full report will follow in a separate note.

## 2019-09-30 ENCOUNTER — Encounter: Payer: Self-pay | Admitting: Psychology

## 2019-09-30 DIAGNOSIS — G20 Parkinson's disease: Secondary | ICD-10-CM

## 2019-09-30 DIAGNOSIS — F0670 Mild neurocognitive disorder due to known physiological condition without behavioral disturbance: Secondary | ICD-10-CM

## 2019-09-30 DIAGNOSIS — G3184 Mild cognitive impairment, so stated: Secondary | ICD-10-CM | POA: Insufficient documentation

## 2019-09-30 HISTORY — DX: Mild cognitive impairment of uncertain or unknown etiology: G31.84

## 2019-09-30 HISTORY — DX: Mild neurocognitive disorder due to known physiological condition without behavioral disturbance: F06.70

## 2019-09-30 HISTORY — DX: Parkinson's disease: G20

## 2019-09-30 NOTE — Patient Instructions (Signed)
Clinical Impression(s): Overall, Carly [name]'s pattern of performance is suggestive of moderate frontal subcortical dysfunction with a prominent weakness across executive functioning. Additional weaknesses were exhibited across processing speed, working memory, phonemic fluency, receptive language, and both encoding (i.e., learning) and retrieval aspects of verbal and visual memory. Outside of her drawing of a clock, visuospatial tasks were within expectation. Performance was also intact across semantic fluency, confrontation naming, and recognition/consolidation aspects of memory. Carly [name] largely denied difficulties completing instrumental activities of daily living (ADLs) independently. However, she did acknowledge decreased confidence surrounding financial management and some instances where she may forget to take medication dosages if busy or distracted. As such, given evidence for cognitive dysfunction described above, she meets criteria for a Mild Neurocognitive Disorder (formerly "mild cognitive impairment"). However, she is likely more towards the moderate to severe end of this spectrum currently.  Regarding etiology, patterns suggesting frontal subcortical dysfunction is common in individuals with Parkinson's disease and this likely represents the primary cause for cognitive deficits. Given the extent of some deficits and the concern that they may be beginning to impact ADLs, I do have concerns surrounding the progression of these deficits resulting in a Parkinson's disease dementia presentation. While I cannot rule out a Lewy body dementia presentation, visuospatial functioning was largely appropriate and she denied common behavioral characteristics of this condition, including fully formed visual hallucinations and fluctuations in alertness. She did report concerns surrounding a REM sleep disorder; however, this can also be seen in Parkinson's disease. Motor dysfunction was said to precede  cognitive dysfunction by several years (she was diagnosed with Parkinson's disease in 2009), which is also inconsistent with the traditional Lewy body dementia timeline. However, while less likely, this should remain on her differential. There could also be a vascular contribution given neuroimaging suggesting mild to moderate small vessel ischemia. Despite weaknesses in learning and retrieval aspects of memory, [name] did not demonstrate an information storage deficit. This, coupled with appropriate semantic fluency and confrontation naming scores, is not suggestive of Alzheimer's disease.   Across mood questionnaires, she reported mild levels of anxiety and depression. She also scored in the "highly likely to have ADHD" and "likely to have ADHD" ranges across inattention and hyperactivity/impulsivity scales on an childhood ADHD questionnaire respectively. While this questionnaire is not diagnostic of ADHD, it suggests the presence of traits of this condition at the very least. This would be consistent with her report of longstanding attentional dysregulation. However, current cognitive deficits are believed to be above and beyond what would be expected from these conditions alone. Continued medical monitoring will be important moving forward.

## 2019-10-09 ENCOUNTER — Other Ambulatory Visit: Payer: Self-pay

## 2019-10-09 ENCOUNTER — Ambulatory Visit (INDEPENDENT_AMBULATORY_CARE_PROVIDER_SITE_OTHER): Payer: Medicare HMO | Admitting: Psychology

## 2019-10-09 DIAGNOSIS — F0670 Mild neurocognitive disorder due to known physiological condition without behavioral disturbance: Secondary | ICD-10-CM

## 2019-10-09 DIAGNOSIS — G3184 Mild cognitive impairment, so stated: Secondary | ICD-10-CM | POA: Diagnosis not present

## 2019-10-09 DIAGNOSIS — G20 Parkinson's disease: Secondary | ICD-10-CM | POA: Diagnosis not present

## 2019-10-09 NOTE — Progress Notes (Signed)
   Neuropsychology Feedback Session Carly [name]. [facility] Department of Neurology  Reason for Referral:   Carly [name] a 74 y.o. right-handed Caucasian female referred by Kerin Salen, D.O.,to characterize hercurrent cognitive functioning and assist with diagnostic clarity and treatment planning in the context of Parkinson's disease and subjective cognitive decline.  Feedback:   Carly [name] completed a comprehensive neuropsychological evaluation on 09/29/2019. Please refer to that encounter for the full report and recommendations. Briefly, results suggested moderate frontal subcortical dysfunction with a prominent weakness across executive functioning. Additional weaknesses were exhibited across processing speed, working memory, phonemic fluency, receptive language, and both encoding (i.e., learning) and retrieval aspects of verbal and visual memory. Regarding etiology, patterns suggesting frontal subcortical dysfunction is common in individuals with Parkinson's disease and this likely represents the primary cause for cognitive deficits. Given the extent of some deficits and the concern that they may be beginning to impact ADLs, I do have concerns surrounding the progression of these deficits resulting in a Parkinson's disease dementia presentation. While I cannot rule out a Lewy body dementia presentation, visuospatial functioning was largely appropriate and she denied common behavioral characteristics of this condition, including fully formed visual hallucinations and fluctuations in alertness.  Carly [name] was accompanied by a close personal friend of her during the current telephone call. They were within her residence while I was within my office. Content of the current session focused on the results of her neuropsychological evaluation. Carly [name] was given the opportunity to ask questions and her questions were answered. She was encouraged to reach out should  additional questions arise. A copy of her report was mailed at the conclusion of the visit.      20 minutes were spent conducting the current feedback session with Carly [name], billed as one unit [phone].

## 2019-10-09 NOTE — Patient Instructions (Signed)
Recommendations: A repeat neuropsychological evaluation in 12-18 months (or sooner if functional decline is noted) is recommended to assess the trajectory of future cognitive decline should it occur. This will also aid in future efforts towards improved diagnostic clarity.  Should there be a progression of her current deficits over time, Carly [name] is unlikely to regain any independent living skills lost. Therefore, it is recommended that she remain as involved as possible in all aspects of household chores, finances, and medication management, with supervision to ensure adequate performance. She will likely benefit from the establishment and maintenance of a routine in order to maximize her functional abilities over time.  It will be important for Carly [name] to have another person with her when in situations where she may need to process information, weigh the pros and cons of different options, and make decisions, in order to ensure that she fully understands and recalls all information to be considered.  If not already done, Carly [name] and her family may want to discuss her wishes regarding durable power of attorney and medical decision making, so that she can have input into these choices. Additionally, they may wish to discuss future plans for caretaking and seek out community options for in home/residential care should they become necessary.  Carly [name] is encouraged to attend to lifestyle factors for brain health (e.g., regular physical exercise, good nutrition habits, regular participation in cognitively-stimulating activities, and general stress management techniques), which are likely to have benefits for both emotional adjustment and cognition. In fact, in addition to promoting good general health, regular exercise incorporating aerobic activities (e.g., brisk walking, jogging, cycling, etc.) has been demonstrated to be a very effective treatment for depression and stress, with similar  efficacy rates to both antidepressant medication and psychotherapy. Optimal control of vascular risk factors (including safe cardiovascular exercise and adherence to dietary recommendations) is encouraged.  When learning new information, she would benefit from information being broken up into small, manageable pieces. She may also find it helpful to articulate the material in her own words and in a context to promote encoding at the onset of a new task. This material may need to be repeated multiple times to promote encoding. Important information should also be provided in written format to promote comprehension and later recall.  Memory can be improved using internal strategies such as rehearsal, repetition, chunking, mnemonics, association, and imagery. External strategies such as written notes in a consistently used memory journal, visual and nonverbal auditory cues such as a calendar on the refrigerator or appointments with alarm, such as on a cell phone, can also help maximize recall.    To address problems with processing speed, she may wish to consider:   -Scheduling more difficult activities for a time of day when she is usually most alert   -Ensuring that she is alerted when essential material or instructions are being presented   -Adjusting the speed at which new information is presented   -Allowing for more time in comprehending, processing, and responding in conversation  To address problems with executive dysfunction, she may wish to consider:   -Avoiding external distractions when needing to concentrate   -Limiting exposure to fast paced environments with multiple sensory demands   -Writing down complicated information and using checklists   -Attempting and completing one task at a time (i.e., no multi-tasking)   -Verbalizing aloud each step of a task to maintain focus   -Taking frequent breaks during the completion of steps/tasks to avoid fatigue   -  Reducing the amount of  information considered at one time

## 2019-10-29 ENCOUNTER — Other Ambulatory Visit: Payer: Self-pay | Admitting: Neurology

## 2019-10-29 ENCOUNTER — Other Ambulatory Visit: Payer: Self-pay

## 2019-10-29 MED ORDER — CARBIDOPA-LEVODOPA 25-100 MG PO TABS: ORAL_TABLET | ORAL | 0 refills | Status: DC

## 2019-10-29 NOTE — Telephone Encounter (Signed)
Patient needs a refill of Sinemet IR sent to [facility] on [address] and [address] in [place], [place]. Her normal pharmacy is out.

## 2019-10-29 NOTE — Telephone Encounter (Signed)
Patient's sister [name] called in again about the patient's Sinemet IR. The patient is going out of town today and needs her medication sent to [facility] on [address] in [place]

## 2019-10-29 NOTE — Telephone Encounter (Signed)
Telephone call to pt, Script sent to new pharmacy per pt. Advised to keep her appt 11/2019 with Dr.Tat

## 2019-11-17 DIAGNOSIS — H25012 Cortical age-related cataract, left eye: Secondary | ICD-10-CM | POA: Diagnosis not present

## 2019-11-17 DIAGNOSIS — H353122 Nonexudative age-related macular degeneration, left eye, intermediate dry stage: Secondary | ICD-10-CM | POA: Diagnosis not present

## 2019-11-17 DIAGNOSIS — H401122 Primary open-angle glaucoma, left eye, moderate stage: Secondary | ICD-10-CM | POA: Diagnosis not present

## 2019-11-17 DIAGNOSIS — H401112 Primary open-angle glaucoma, right eye, moderate stage: Secondary | ICD-10-CM | POA: Diagnosis not present

## 2019-11-25 ENCOUNTER — Other Ambulatory Visit: Payer: Self-pay | Admitting: Neurology

## 2019-11-25 NOTE — Telephone Encounter (Signed)
Rx(s) sent to pharmacy electronically.  

## 2019-11-25 NOTE — Progress Notes (Signed)
Assessment/Plan:   1.  Parkinsons Disease  -Continue carbidopa/levodopa 25/100, 1 tablet 4 times per day  -Continue carbidopa/levodopa 50/200 at bedtime  -Decrease pramipexole to 0.5 mg, 1 tablet 3 times daily  -information given to patient for RWW exercise programs  -will send referral to [facility] for exercise  -discussed using walker instead of cane.  She has fallen because of start hesitation in the past and nearly did in my office today.    2.  Parkinson's dyskinesia  -Not that bothersome to patient.  3.  B12 deficiency  -On oral supplementation. 4.  MCI  -Neurocognitive testing in June, 2021 with evidence of MCI.  She did not meet criteria for dementia at that time.  Subjective:   Carly [name] was seen today in follow up for Parkinsons disease.  My previous records were reviewed prior to todays visit as well as outside records available to me. Pt had 2 falls last week - states that she ran into a coffee table with one of the falls.  With the other, she just fell after a start hesitation.  She has done PT in the past at [facility] and wants to go again.   Pt denies lightheadedness, near syncope.  Has visual distortions and occasionally may think that she sees a bug.  No longer sees people.  Mood has been good.  Patient had neurocognitive testing with Dr. Milbert Coulter in June.  Those records are reviewed.  This demonstrated MCI.  Current prescribed movement disorder medications: Carbidopa/levodopa 25/100, 1 tablet at 6 AM/10 AM/2 PM/6 PM Carbidopa/levodopa 50/200 at bedtime Pramipexole 0.5 mg, 2 in the morning, 1 in the afternoon, 1 in the evening (decreased last visit)   ALLERGIES:   Allergies  Allergen Reactions  . Codeine Nausea Only and Other (See Comments)    hallucinations    CURRENT MEDICATIONS:  Outpatient Encounter Medications as of 11/27/2019  Medication Sig  . ALPRAZolam (XANAX) 0.5 MG tablet Take 1 tablet (0.5 mg total) by mouth 2 (two) times daily as needed for anxiety.    . carbidopa-levodopa (SINEMET IR) 25-100 MG tablet TAKE 1 TABLET BY MOUTH FOUR TIMES DAILY  . citalopram (CELEXA) 10 MG tablet Take 1 tablet (10 mg total) by mouth daily.  . dorzolamide-timolol (COSOPT) 22.3-6.8 MG/ML ophthalmic solution Apply to eye.  . latanoprost (XALATAN) 0.005 % ophthalmic solution Place 1 drop into both eyes at bedtime.   . pramipexole (MIRAPEX) 0.5 MG tablet 2 in the AM, 1 in the afternoon and 1 in the evening   No facility-administered encounter medications on file as of 11/27/2019.    Objective:   PHYSICAL EXAMINATION:    VITALS:  There were no vitals filed for this visit.  GEN:  The patient appears stated age and is in NAD. HEENT:  Normocephalic, atraumatic.  The mucous membranes are moist. The superficial temporal arteries are without ropiness or tenderness. CV:  RRR Lungs:  CTAB Neck/HEME:  There are no carotid bruits bilaterally.  Neurological examination:  Orientation: The patient is alert and oriented x3. Cranial nerves: There is good facial symmetry with mild facial hypomimia. The speech is fluent and clear. Soft palate rises symmetrically and there is no tongue deviation. Hearing is intact to conversational tone. Sensation: Sensation is intact to light touch throughout Motor: Strength is at least antigravity x4.  Movement examination: Tone: There is mild increased tone in the LUE Abnormal movements: there is R leg dyskinesia, mild Coordination:  There is mild decremation with RAM's, Gait and  Station: The patient pushes off to arise.  She has start hesitation and nearly falls.  Once she gets in the open, she walks well with the cane.   I have reviewed and interpreted the following labs independently    Chemistry      Component Value Date/Time   NA 139 11/07/2017 1037   K 4.2 11/07/2017 1037   CL 103 11/07/2017 1037   CO2 29 11/07/2017 1037   BUN 15 11/07/2017 1037   CREATININE 1.06 11/07/2017 1037      Component Value Date/Time    CALCIUM 9.6 11/07/2017 1037   ALKPHOS 87 11/07/2017 1037   AST 16 11/07/2017 1037   ALT 4 11/07/2017 1037   BILITOT 1.1 11/07/2017 1037       Lab Results  Component Value Date   WBC 6.4 11/07/2017   HGB 13.1 11/07/2017   HCT 38.9 11/07/2017   MCV 88.1 11/07/2017   PLT 152.0 11/07/2017    Lab Results  Component Value Date   TSH 1.15 11/07/2017     Total time spent on today's visit was 30 minutes, including both face-to-face time and nonface-to-face time.  Time included that spent on review of records (prior notes available to me/labs/imaging if pertinent), discussing treatment and goals, answering patient's questions and coordinating care.  Cc:  Sheliah Hatch, MD

## 2019-11-27 ENCOUNTER — Telehealth: Payer: Self-pay | Admitting: Neurology

## 2019-11-27 ENCOUNTER — Other Ambulatory Visit: Payer: Self-pay

## 2019-11-27 ENCOUNTER — Encounter: Payer: Self-pay | Admitting: Neurology

## 2019-11-27 ENCOUNTER — Ambulatory Visit: Payer: Medicare HMO | Admitting: Neurology

## 2019-11-27 VITALS — BP 152/73 | HR 62 | Ht 65.0 in | Wt 181.0 lb

## 2019-11-27 DIAGNOSIS — G20 Parkinson's disease: Secondary | ICD-10-CM

## 2019-11-27 MED ORDER — PRAMIPEXOLE DIHYDROCHLORIDE 0.5 MG PO TABS
0.5000 mg | ORAL_TABLET | Freq: Three times a day (TID) | ORAL | 1 refills | Status: DC
Start: 2019-11-27 — End: 2020-02-04

## 2019-11-27 NOTE — Patient Instructions (Addendum)
1.  Decrease pramipexole to 1 tablet three times per day 2  We will send a referral to [facility]  The physicians and staff at [facility] are committed to providing excellent care. You may receive a survey requesting feedback about your experience at our office. We strive to receive "very good" responses to the survey questions. If you feel that your experience would prevent you from giving the office a "very good " response, please contact our office to try to remedy the situation. We may be reached at [phone]. Thank you for taking the time out of your busy day to complete the survey.

## 2019-11-27 NOTE — Telephone Encounter (Signed)
As patient was leaving she requested for Dr Tat to fill out a handicap placard form for her. Please call patient when ready.

## 2019-11-27 NOTE — Telephone Encounter (Signed)
yes

## 2019-11-28 NOTE — Telephone Encounter (Signed)
TRIED TO CONTACT PATIENT, NO ANSWER AND UNABLE TO LEAVE A VOICEMAIL, MAILBOX NOT SET UP.

## 2019-12-01 DIAGNOSIS — M25532 Pain in left wrist: Secondary | ICD-10-CM | POA: Diagnosis not present

## 2019-12-01 DIAGNOSIS — M25522 Pain in left elbow: Secondary | ICD-10-CM | POA: Diagnosis not present

## 2019-12-01 DIAGNOSIS — S52562A Barton's fracture of left radius, initial encounter for closed fracture: Secondary | ICD-10-CM | POA: Diagnosis not present

## 2019-12-01 NOTE — Telephone Encounter (Signed)
Attempted to reach patient on home phone no answer and unable to leave a message.   Left message on cell phone informing patient that placard form has been placed upfront for pickup.

## 2019-12-02 DIAGNOSIS — S5292XA Unspecified fracture of left forearm, initial encounter for closed fracture: Secondary | ICD-10-CM | POA: Diagnosis not present

## 2019-12-02 DIAGNOSIS — W19XXXA Unspecified fall, initial encounter: Secondary | ICD-10-CM | POA: Diagnosis not present

## 2019-12-02 DIAGNOSIS — Z79899 Other long term (current) drug therapy: Secondary | ICD-10-CM | POA: Diagnosis not present

## 2019-12-02 DIAGNOSIS — S52502A Unspecified fracture of the lower end of left radius, initial encounter for closed fracture: Secondary | ICD-10-CM | POA: Diagnosis not present

## 2019-12-02 DIAGNOSIS — S52542A Smith's fracture of left radius, initial encounter for closed fracture: Secondary | ICD-10-CM | POA: Diagnosis not present

## 2019-12-02 DIAGNOSIS — G20 Parkinson's disease: Secondary | ICD-10-CM | POA: Diagnosis not present

## 2019-12-09 DIAGNOSIS — Z4889 Encounter for other specified surgical aftercare: Secondary | ICD-10-CM | POA: Diagnosis not present

## 2019-12-11 ENCOUNTER — Telehealth: Payer: Self-pay

## 2019-12-11 DIAGNOSIS — G20 Parkinson's disease: Secondary | ICD-10-CM

## 2019-12-11 NOTE — Telephone Encounter (Signed)
I spoke with Toniann Fail and they are not. I will place referral for [facility].

## 2019-12-11 NOTE — Telephone Encounter (Signed)
Are they not in network because when I google it they are within 12 miles of her home.  Find out that, if not, refer to services at [facility]

## 2019-12-11 NOTE — Telephone Encounter (Signed)
Received a call from [place] at [employer]. She stated that the patient will not be able to receive services with her company. She states she reached out to the patients inurance and was informed that the patient must only go to therapy locations that are in network and within fifteen mile radius of her home. She states the insurance has updated the rules and wanted to make Dr Tat aware. She states if the patient still wanted to go there for services she would have to pay fifty percent of the cost upfront and reach her maximum benefits before her insurance will kick in.

## 2019-12-16 ENCOUNTER — Other Ambulatory Visit: Payer: Self-pay | Admitting: Neurology

## 2019-12-17 ENCOUNTER — Telehealth: Payer: Self-pay | Admitting: Neurology

## 2019-12-17 NOTE — Telephone Encounter (Signed)
Spoke with pharmacist who stated the patient picked up her RX yesterday with a good Rx card instead of using her insurance.   Tried to contact patient, no answer and mailbox full-unable to leave a message.

## 2019-12-17 NOTE — Telephone Encounter (Signed)
[facility] 12/16/19 @ 5:15pm:  "Caller states her sister needs to have her prescription refilled for her Carbidopa/levodopa 100mg .  Nurse assessment: Caller states her sister needs to have her prescription refilled for her Carbidopa/lecodopa 100 mg. Takes TID. Pharmacist tells caller she should have 10 left. Patient advised by Neurologic Deficit Guideline for numbness/tingling. Nurse paged and reached on-call provider (Tat). Nurse comments: - has had 1 dose today - pt is having leg cramps - legs feel "heavy" - spoke with pharmacist [facility] regarding refill. He will refill the prescription, states it is written for QID. Caller states she is taking TID. Did not request any refills, will recommend the caller be seen by PCP before next refill. - caller is allergic to codeine - recommended to caller (and sister) that she get in to see her PCP within the next few weeks to discuss med and make sure the dose she is currently taking is effective. Sister to help with med administration at home. Caller voiced understanding.  On Call provider: As per on-call provider, this nurse may call pharmacy for refill but provider states they may charge her out-of-pocket since there should still be 10 pills left. Provider also states the caller should be encouraged to call before office closes since she took her last pill this morning."

## 2019-12-17 NOTE — Telephone Encounter (Signed)
Rx(s) sent to pharmacy electronically.  

## 2019-12-17 NOTE — Telephone Encounter (Signed)
Received call via answering service last night that pt didn't have enough levodopa (she knew yesterday AM) and pharmacy wouldn't fill because too early.  I told RN that okay with me to fill but it sounded like she was running out too early?  Also told them to make sure she tried to call for RF during business hours.  Rhae Hammock, will you call pharmacy and find out what issue is?  Looks like you did refill this AM.  thanks

## 2019-12-18 NOTE — Telephone Encounter (Signed)
[facility] 12/18/19 @ 12:00pm:  "Caller wants to know if the medication (carbidopa) can be placed on auto refill also she is needing a current listing of her medications and the frequency she is needing to take them."

## 2019-12-18 NOTE — Telephone Encounter (Signed)
Phone straight going to voicemail and the box is full can not leave a message-home number.  Reached patient on her cell phone. Spoke with patient and she states she is taking her Carbidopa levodopa 4 times a day.  Patient states she is going to try to take the medication four times a day. She states she got her medication with the goodrx coupon because it was cheaper.

## 2020-01-01 ENCOUNTER — Telehealth: Payer: Self-pay | Admitting: Family Medicine

## 2020-01-01 NOTE — Progress Notes (Signed)
  Chronic Care Management   Note  01/01/2020 Name: MILADY FLEENER MRN: [id number] DOB: 1945/05/15  [name] is a 74 y.o. year old female who is a primary care patient of Beverely Low, Helane Rima, MD. I reached out to [name] by phone today in response to a referral sent by Ms. Adora Fridge Scriven's PCP, Sheliah Hatch, MD.   Ms. Dobbin was given information about Chronic Care Management services today including:  1. CCM service includes personalized support from designated clinical staff supervised by her physician, including individualized plan of care and coordination with other care providers 2. 24/7 contact phone numbers for assistance for urgent and routine care needs. 3. Service will only be billed when office clinical staff spend 20 minutes or more in a month to coordinate care. 4. Only one practitioner may furnish and bill the service in a calendar month. 5. The patient may stop CCM services at any time (effective at the end of the month) by phone call to the office staff.   Patient agreed to services and verbal consent obtained.   Follow up plan:   Carley Perdue [employer] Scheduler

## 2020-01-08 ENCOUNTER — Other Ambulatory Visit: Payer: Self-pay

## 2020-01-08 ENCOUNTER — Telehealth: Payer: Self-pay | Admitting: Neurology

## 2020-01-08 MED ORDER — CARBIDOPA-LEVODOPA 25-100 MG PO TABS: 1.0000 | ORAL_TABLET | Freq: Four times a day (QID) | ORAL | 1 refills | Status: DC

## 2020-01-17 ENCOUNTER — Other Ambulatory Visit: Payer: Self-pay | Admitting: Family Medicine

## 2020-01-19 DIAGNOSIS — H401134 Primary open-angle glaucoma, bilateral, indeterminate stage: Secondary | ICD-10-CM | POA: Diagnosis not present

## 2020-02-04 ENCOUNTER — Other Ambulatory Visit: Payer: Self-pay

## 2020-02-04 ENCOUNTER — Telehealth: Payer: Self-pay | Admitting: Neurology

## 2020-02-04 MED ORDER — PRAMIPEXOLE DIHYDROCHLORIDE 0.5 MG PO TABS: 0.5000 mg | ORAL_TABLET | Freq: Three times a day (TID) | ORAL | 1 refills | Status: DC

## 2020-02-04 NOTE — Telephone Encounter (Signed)
Patient needs a refill on her Pramipexole for a 90 day supply to the [facility] on [address] in [place]

## 2020-02-04 NOTE — Telephone Encounter (Signed)
Sent in rx.

## 2020-02-12 ENCOUNTER — Telehealth: Payer: Self-pay

## 2020-02-12 NOTE — Progress Notes (Signed)
Chronic Care Management Pharmacy Assistant   Name: Carly [name]  MRN: [id number] DOB: 1946/02/18  Reason for Encounter: Initial Visit  Patient Questions:  1.  Have you seen any other providers since your last visit? No  2.  Any changes in your medicines or health? Yes, Patient states she broke her arm   Carly [name],  74 y.o. , female presents for their Initial CCM visit with the clinical pharmacist via telephone.  PCP : Sheliah Hatch, MD  Allergies:   Allergies  Allergen Reactions  . Codeine Nausea Only and Other (See Comments)    hallucinations    Medications: Outpatient Encounter Medications as of 02/12/2020  Medication Sig  . ALPRAZolam (XANAX) 0.5 MG tablet Take 1 tablet (0.5 mg total) by mouth 2 (two) times daily as needed for anxiety.  . carbidopa-levodopa (SINEMET IR) 25-100 MG tablet Take 1 tablet by mouth 4 (four) times daily.  . citalopram (CELEXA) 10 MG tablet Take 1 tablet (10 mg total) by mouth daily.  . dorzolamide-timolol (COSOPT) 22.3-6.8 MG/ML ophthalmic solution Apply to eye.  . latanoprost (XALATAN) 0.005 % ophthalmic solution Place 1 drop into both eyes at bedtime.   . meloxicam (MOBIC) 15 MG tablet TAKE ONE TABLET BY MOUTH DAILY  . pramipexole (MIRAPEX) 0.5 MG tablet Take 1 tablet (0.5 mg total) by mouth 3 (three) times daily.   No facility-administered encounter medications on file as of 02/12/2020.    Current Diagnosis: Patient Active Problem List   Diagnosis Date Noted  . 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 Color vision defect 05/21/2019  . Decreased visual acuity 05/21/2019  . Vitamin D deficiency 11/07/2017  . Primary osteoarthritis of left knee 05/21/2017  . Degenerative arthritis of left knee 05/18/2017  . Anterior cervical lymphadenopathy 05/14/2015  . Osteopenia 02/15/2015  . B12 deficiency 01/01/2014  . Achilles tendinitis 01/31/2010  . Parkinson's disease 07/08/2008  . Tremor,  right hand 04/07/2008  . Generalized anxiety disorder with panic attacks 01/31/2007  . HTN (hypertension) 01/31/2007    Have you seen any other providers since your last visit?            Patient states she has not seen any other providers.   Any changes in your medications or health?          Patient states her arm broke. Patient states her dog jumped on her and she fell and broke her arm.  Any side effects from any medications?          Patient states she has no side effects from any medications.  Do you have an symptoms or problems not managed by your medications?         Patient states she has no symptoms or problems not managed by medications at this moment.  Any concerns about your health right now?            Patient states she has no concerns about her health at this time.  Has your provider asked that you check blood pressure, blood sugar, or follow special diet at home?         Patient states she does checks blood pressure and tries to follow a diet at home.  Do you get any type of exercise on a regular basis?        Patient states she use to exercise every day but has not since her arm broke.  Can you think of a goal you would like  to reach for your health?         Patient states she would like it make it too 73 years old.  Do you have any problems getting your medications?        Patient states when she goes out of town , medication is always short. Patient states she is using [employer] and uses [facility].  Is there anything that you would like to discuss during the appointment?                   Patient states she wants to try to figure out best way to take pills.   Informed patient about possibly [facility] and informed patient [facility] will [facility] to her.   Patient would like to discuss to CPP more about using [facility].  Please bring medications and supplements to appointment    Aloha Gell ,[facility] Clinical Pharmacist  Assistant [phone]      Follow-Up:  Pharmacist Review

## 2020-02-13 ENCOUNTER — Ambulatory Visit: Payer: Medicare HMO

## 2020-02-13 DIAGNOSIS — I10 Essential (primary) hypertension: Secondary | ICD-10-CM

## 2020-02-13 DIAGNOSIS — F410 Panic disorder [episodic paroxysmal anxiety] without agoraphobia: Secondary | ICD-10-CM

## 2020-02-13 NOTE — Patient Instructions (Addendum)
Please review care plan below and call me at [phone] with any questions!  Thank you, Bard Herbert., Clinical Pharmacist  Goals Addressed            This Visit's Progress   . PharmD Care Plan       CARE PLAN ENTRY (see longitudinal plan of care for additional care plan information)  Current Barriers:  . Chronic Disease Management support, education, and care coordination needs related to Hypertension and Anxiety   Hypertension BP Readings from Last 3 Encounters:  11/27/19 (!) 152/73  07/07/19 (!) 155/70  04/25/19 130/70   . Pharmacist Clinical Goal(s): o Over the next 180 days, patient will work with PharmD and providers to achieve BP goal <140/90 . Current regimen:  o No medications at this time . Interventions: o Discussed home BP monitoring recommendations . Patient self care activities - Over the next 180 days, patient will: o Check BP at least once every 1-2 weeks, document, and provide at future appointments o Ensure daily salt intake < 2300 mg/day  Anxiety . Pharmacist Clinical Goal(s) o Over the next 180 days, patient will work with PharmD and providers to minimize anxiety symptoms, ensure medication safety and accessibility. . Current regimen:  o Citalopram 10 mg once daily o Alprazolam for as needed use . Interventions: o Reviewed medication for ongoing benefit/side effects . Patient self care activities - Over the next 180 days, patient will: o Continue current management o  Medication management . Pharmacist Clinical Goal(s): o Over the next 180 days, patient will work with PharmD and providers to achieve optimal medication adherence . Current pharmacy: [facility] . Interventions o Comprehensive medication review performed. o Continue current medication management strategy . Patient self care activities - Over the next 180 days, patient will: o Take medications as prescribed o Report any questions or concerns to PharmD and/or provider(s) Initial goal  documentation.      The patient verbalized understanding of instructions provided today and agreed to receive a mailed copy of patient instruction and/or educational materials. Telephone follow up appointment with pharmacy team member scheduled for: See next appointment with "Care Management Staff" under "What's Next" below.   Dahlia Byes, Pharm.D., BCGP Clinical Pharmacist [facility] [phone]  Hypertension, Adult High blood pressure (hypertension) is when the force of blood pumping through the arteries is too strong. The arteries are the blood vessels that carry blood from the heart throughout the body. Hypertension forces the heart to work harder to pump blood and may cause arteries to become narrow or stiff. Untreated or uncontrolled hypertension can cause a heart attack, heart failure, a stroke, kidney disease, and other problems. A blood pressure reading consists of a higher number over a lower number. Ideally, your blood pressure should be below 120/80. The first ("top") number is called the systolic pressure. It is a measure of the pressure in your arteries as your heart beats. The second ("bottom") number is called the diastolic pressure. It is a measure of the pressure in your arteries as the heart relaxes. What are the causes? The exact cause of this condition is not known. There are some conditions that result in or are related to high blood pressure. What increases the risk? Some risk factors for high blood pressure are under your control. The following factors may make you more likely to develop this condition:  Smoking.  Having type 2 diabetes mellitus, high cholesterol, or both.  Not getting enough exercise or physical activity.  Being overweight.  Having too much fat, sugar, calories, or salt (sodium) in your diet.  Drinking too much alcohol. Some risk factors for high blood pressure may be difficult or impossible to change. Some of these factors  include:  Having chronic kidney disease.  Having a family history of high blood pressure.  Age. Risk increases with age.  Race. You may be at higher risk if you are African American.  Gender. Men are at higher risk than women before age 2. After age 68, women are at higher risk than men.  Having obstructive sleep apnea.  Stress. What are the signs or symptoms? High blood pressure may not cause symptoms. Very high blood pressure (hypertensive crisis) may cause:  Headache.  Anxiety.  Shortness of breath.  Nosebleed.  Nausea and vomiting.  Vision changes.  Severe chest pain.  Seizures. How is this diagnosed? This condition is diagnosed by measuring your blood pressure while you are seated, with your arm resting on a flat surface, your legs uncrossed, and your feet flat on the floor. The cuff of the blood pressure monitor will be placed directly against the skin of your upper arm at the level of your heart. It should be measured at least twice using the same arm. Certain conditions can cause a difference in blood pressure between your right and left arms. Certain factors can cause blood pressure readings to be lower or higher than normal for a short period of time:  When your blood pressure is higher when you are in a health care provider's office than when you are at home, this is called white coat hypertension. Most people with this condition do not need medicines.  When your blood pressure is higher at home than when you are in a health care provider's office, this is called masked hypertension. Most people with this condition may need medicines to control blood pressure. If you have a high blood pressure reading during one visit or you have normal blood pressure with other risk factors, you may be asked to:  Return on a different day to have your blood pressure checked again.  Monitor your blood pressure at home for 1 week or longer. If you are diagnosed with  hypertension, you may have other blood or imaging tests to help your health care provider understand your overall risk for other conditions. How is this treated? This condition is treated by making healthy lifestyle changes, such as eating healthy foods, exercising more, and reducing your alcohol intake. Your health care provider may prescribe medicine if lifestyle changes are not enough to get your blood pressure under control, and if:  Your systolic blood pressure is above 130.  Your diastolic blood pressure is above 80. Your personal target blood pressure may vary depending on your medical conditions, your age, and other factors. Follow these instructions at home: Eating and drinking   Eat a diet that is high in fiber and potassium, and low in sodium, added sugar, and fat. An example eating plan is called the DASH (Dietary Approaches to Stop Hypertension) diet. To eat this way: ? Eat plenty of fresh fruits and vegetables. Try to fill one half of your plate at each meal with fruits and vegetables. ? Eat whole grains, such as whole-wheat pasta, brown rice, or whole-grain bread. Fill about one fourth of your plate with whole grains. ? Eat or drink low-fat dairy products, such as skim milk or low-fat yogurt. ? Avoid fatty cuts of meat, processed or cured meats, and poultry with skin.  Fill about one fourth of your plate with lean proteins, such as fish, chicken without skin, beans, eggs, or tofu. ? Avoid pre-made and processed foods. These tend to be higher in sodium, added sugar, and fat.  Reduce your daily sodium intake. Most people with hypertension should eat less than 1,500 mg of sodium a day.  Do not drink alcohol if: ? Your health care provider tells you not to drink. ? You are pregnant, may be pregnant, or are planning to become pregnant.  If you drink alcohol: ? Limit how much you use to:  0-1 drink a day for women.  0-2 drinks a day for men. ? Be aware of how much alcohol is in  your drink. In the [place] one drink equals one 12 oz bottle of beer (355 mL), one 5 oz glass of wine (148 mL), or one 1 oz glass of hard liquor (44 mL). Lifestyle   Work with your health care provider to maintain a healthy body weight or to lose weight. Ask what an ideal weight is for you.  Get at least 30 minutes of exercise most days of the week. Activities may include walking, swimming, or biking.  Include exercise to strengthen your muscles (resistance exercise), such as Pilates or lifting weights, as part of your weekly exercise routine. Try to do these types of exercises for 30 minutes at least 3 days a week.  Do not use any products that contain nicotine or tobacco, such as cigarettes, e-cigarettes, and chewing tobacco. If you need help quitting, ask your health care provider.  Monitor your blood pressure at home as told by your health care provider.  Keep all follow-up visits as told by your health care provider. This is important. Medicines  Take over-the-counter and prescription medicines only as told by your health care provider. Follow directions carefully. Blood pressure medicines must be taken as prescribed.  Do not skip doses of blood pressure medicine. Doing this puts you at risk for problems and can make the medicine less effective.  Ask your health care provider about side effects or reactions to medicines that you should watch for. Contact a health care provider if you:  Think you are having a reaction to a medicine you are taking.  Have headaches that keep coming back (recurring).  Feel dizzy.  Have swelling in your ankles.  Have trouble with your vision. Get help right away if you:  Develop a severe headache or confusion.  Have unusual weakness or numbness.  Feel faint.  Have severe pain in your chest or abdomen.  Vomit repeatedly.  Have trouble breathing. Summary  Hypertension is when the force of blood pumping through your arteries is too strong.  If this condition is not controlled, it may put you at risk for serious complications.  Your personal target blood pressure may vary depending on your medical conditions, your age, and other factors. For most people, a normal blood pressure is less than 120/80.  Hypertension is treated with lifestyle changes, medicines, or a combination of both. Lifestyle changes include losing weight, eating a healthy, low-sodium diet, exercising more, and limiting alcohol. This information is not intended to replace advice given to you by your health care provider. Make sure you discuss any questions you have with your health care provider. Document Revised: 11/28/2017 Document Reviewed: 11/28/2017 Elsevier Patient Education  2020 [employer].

## 2020-02-13 NOTE — Progress Notes (Signed)
Subjective:   Carly [name] is a 74 y.o. female who presents for Medicare Annual (Subsequent) preventive examination.   I connected with Anica today by telephone and verified that I am speaking with the correct person using two identifiers. Location patient: home Location provider: work Persons participating in the virtual visit: patient, Engineer, civil (consulting).    I discussed the limitations, risks, security and privacy concerns of performing an evaluation and management service by telephone and the availability of in person appointments. I also discussed with the patient that there may be a patient responsible charge related to this service. The patient expressed understanding and verbally consented to this telephonic visit.    Interactive audio and video telecommunications were attempted between this provider and patient, however failed, due to patient having technical difficulties OR patient did not have access to video capability.  We continued and completed visit with audio only.  Some vital signs may be absent or patient reported.   Time Spent with patient on telephone encounter: 40 minutes  Review of Systems     Cardiac Risk Factors include: advanced age (>39men, >80 women);hypertension;obesity (BMI >30kg/m2);sedentary lifestyle     Objective:    Today's Vitals   02/16/20 1549  Weight: 181 lb (82.1 kg)  Height: 5\' 5"  (1.651 m)   Body mass index is 30.12 kg/m.  Advanced Directives 02/16/2020 11/27/2019 07/07/2019 04/25/2019 10/22/2018 11/07/2017 05/21/2017  Does Patient Have a Medical Advance Directive? Yes Yes Yes No Yes Yes Yes  Type of 05/23/2017 of [place];Living will Healthcare Power of [place];Living will Living will;Healthcare Power of Attorney - Living will;Healthcare Power of [place] Power of [place];Living will Healthcare Power of [place];Living will  Does patient want to make changes to medical advance directive? - - - - - - No - Patient  declined  Copy of Healthcare Power of Attorney in Chart? No - copy requested - - - - No - copy requested No - copy requested    Current Medications (verified) Outpatient Encounter Medications as of 02/16/2020  Medication Sig  . ALPRAZolam (XANAX) 0.5 MG tablet Take 1 tablet (0.5 mg total) by mouth 2 (two) times daily as needed for anxiety.  . carbidopa-levodopa (SINEMET IR) 25-100 MG tablet Take 1 tablet by mouth 4 (four) times daily. (Patient taking differently: Take 1 tablet by mouth 4 (four) times daily. Breakfast, lunch, dinner, bedtime)  . citalopram (CELEXA) 10 MG tablet Take 1 tablet (10 mg total) by mouth daily. (Patient taking differently: Take 10 mg by mouth in the morning. )  . dorzolamide-timolol (COSOPT) 22.3-6.8 MG/ML ophthalmic solution Place 1 drop into both eyes 2 (two) times daily.   02/18/2020 latanoprost (XALATAN) 0.005 % ophthalmic solution Place 1 drop into both eyes at bedtime.   . meloxicam (MOBIC) 15 MG tablet TAKE ONE TABLET BY MOUTH DAILY  . pramipexole (MIRAPEX) 0.5 MG tablet Take 1 tablet (0.5 mg total) by mouth 3 (three) times daily. (Patient taking differently: Take 0.5 mg by mouth 3 (three) times daily. Breakfast, lunch, bedtime.)   No facility-administered encounter medications on file as of 02/16/2020.    Allergies (verified) Codeine   History: Past Medical History:  Diagnosis Date  . Anemia   . Anterior cervical lymphadenopathy 05/14/2015  . B12 deficiency 01/01/2014  . Bronchitis 04/12/2011  . Color vision defect 05/21/2019  . Decreased visual acuity 05/21/2019  . Degenerative arthritis of left knee 05/18/2017  . DVT of lower extremity, bilateral    years ago   . Generalized  anxiety disorder with panic attacks 01/31/2007  . HTN (hypertension) 01/31/2007  . Hypertension   . 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 Osteopenia 02/15/2015  . Parkinson's disease   . Primary osteoarthritis of left knee 05/21/2017  . Vitamin D deficiency     Past Surgical History:  Procedure Laterality Date  . BREAST BIOPSY    . DILATION AND CURETTAGE OF UTERUS    . EYE SURGERY Right 05/21/2018  . KNEE SURGERY Left 01/2017  . REFRACTIVE SURGERY Bilateral   . TOTAL KNEE ARTHROPLASTY Left 05/21/2017   Procedure: TOTAL KNEE ARTHROPLASTY;  Surgeon: Gean Birchwood, MD;  Location: [facility] OR;  Service: Orthopedics;  Laterality: Left;  . wrist sx     Family History  Problem Relation Age of Onset  . Heart attack Father   . Heart failure Mother   . Cancer Brother        brain  . Cancer Brother        brain  . Healthy Sister   . Stroke Sister   . Arthritis Son    Social History   Socioeconomic History  . Marital status: Significant Other    Spouse name: Not on file  . Number of children: 1  . Years of education: 66  . Highest education level: High school graduate  Occupational History  . Occupation: Retired  Tobacco Use  . Smoking status: Never Smoker  . Smokeless tobacco: Never Used  Vaping Use  . Vaping Use: Never used  Substance and Sexual Activity  . Alcohol use: No  . Drug use: No  . Sexual activity: Not on file  Other Topics Concern  . Not on file  Social History Narrative   Patient lives at home with partner Demetrios Loll.    Patient has one adult child.    Patient has 14 years of education.    Patient does not work.   Caffeine consumption is 1 cup daily    Social Determinants of Health   Financial Resource Strain: Low Risk   . Difficulty of Paying Living Expenses: Not hard at all  Food Insecurity: No Food Insecurity  . Worried About Programme researcher, broadcasting/film/video in the Last Year: Never true  . Ran Out of Food in the Last Year: Never true  Transportation Needs: No Transportation Needs  . Lack of Transportation (Medical): No  . Lack of Transportation (Non-Medical): No  Physical Activity: Inactive  . Days of Exercise per Week: 0 days  . Minutes of Exercise per Session: 0 min  Stress: No Stress Concern Present  . Feeling of  Stress : Only a little  Social Connections: Moderately Isolated  . Frequency of Communication with Friends and Family: More than three times a week  . Frequency of Social Gatherings with Friends and Family: More than three times a week  . Attends Religious Services: Never  . Active Member of Clubs or Organizations: No  . Attends Banker Meetings: Never  . Marital Status: Living with partner    Tobacco Counseling Counseling given: Not Answered   Clinical Intake:  Pre-visit preparation completed: Yes  Pain : No/denies pain     Nutritional Status: BMI > 30  Obese Nutritional Risks: None Diabetes: No  How often do you need to have someone help you when you read instructions, pamphlets, or other written materials from your doctor or pharmacy?: 1 - Never What is the last grade level you completed in school?: 1 yr of college  Diabetic?No  Interpreter Needed?: No  Information entered by :: Thomasenia SalesMartha Shawntia Mangal LPN   Activities of Daily Living In your present state of health, do you have any difficulty performing the following activities: 02/16/2020 08/21/2019  Hearing? N N  Vision? N N  Difficulty concentrating or making decisions? Y N  Comment sometimes -  Walking or climbing stairs? N N  Dressing or bathing? N N  Doing errands, shopping? N N  Preparing Food and eating ? N -  Using the Toilet? N -  In the past six months, have you accidently leaked urine? N -  Do you have problems with loss of bowel control? N -  Managing your Medications? N -  Managing your Finances? N -  Housekeeping or managing your Housekeeping? N -  Some recent data might be hidden    Patient Care Team: Sheliah Hatchabori, Katherine E, MD as PCP - General Tat, Octaviano Battyebecca S, DO as Consulting Physician (Neurology) Salvatore MarvelWainer, Robert, MD as Consulting Physician (Orthopedic Surgery) Gean Birchwoodowan, Frank, MD as Consulting Physician (Orthopedic Surgery) Dahlia ByesPotts, Jacob, [facility] as Pharmacist (Pharmacist)  Indicate any  recent Medical Services you may have received from other than [facility] providers in the past year (date may be approximate).     Assessment:   This is a routine wellness examination for [facility].  Hearing/Vision screen  Hearing Screening   125Hz  250Hz  500Hz  1000Hz  2000Hz  3000Hz  4000Hz  6000Hz  8000Hz   Right ear:           Left ear:           Comments: Mild hearing loss  Vision Screening Comments: Wears reading glasses Last eye exam-01/2020  Dietary issues and exercise activities discussed: Current Exercise Habits: The patient does not participate in regular exercise at present, Exercise limited by: neurologic condition(s)  Goals    . Patient Stated     Increase activity    . PharmD Care Plan     CARE PLAN ENTRY (see longitudinal plan of care for additional care plan information)  Current Barriers:  . Chronic Disease Management support, education, and care coordination needs related to Hypertension and Anxiety   Hypertension BP Readings from Last 3 Encounters:  11/27/19 (!) 152/73  07/07/19 (!) 155/70  04/25/19 130/70   . Pharmacist Clinical Goal(s): o Over the next 180 days, patient will work with PharmD and providers to achieve BP goal <140/90 . Current regimen:  o No medications at this time . Interventions: o Discussed home BP monitoring recommendations . Patient self care activities - Over the next 180 days, patient will: o Check BP at least once every 1-2 weeks, document, and provide at future appointments o Ensure daily salt intake < 2300 mg/day  Anxiety . Pharmacist Clinical Goal(s) o Over the next 180 days, patient will work with PharmD and providers to minimize anxiety symptoms, ensure medication safety and accessibility. . Current regimen:  o Citalopram 10 mg once daily o Alprazolam for as needed use . Interventions: o Reviewed medication for ongoing benefit/side effects . Patient self care activities - Over the next 180 days, patient will: o Continue current  management o  Medication management . Pharmacist Clinical Goal(s): o Over the next 180 days, patient will work with PharmD and providers to achieve optimal medication adherence . Current pharmacy: [facility] . Interventions o Comprehensive medication review performed. o Continue current medication management strategy . Patient self care activities - Over the next 180 days, patient will: o Take medications as prescribed o Report any questions or concerns to PharmD  and/or provider(s) Initial goal documentation.      Depression Screen PHQ 2/9 Scores 02/16/2020 02/13/2020 08/21/2019 07/30/2018 01/30/2018 11/07/2017 11/01/2016  PHQ - 2 Score 0 0 2 0 0 0 0  PHQ- 9 Score - - 4 - 0 - -    Fall Risk Fall Risk  02/16/2020 11/27/2019 08/21/2019 07/07/2019 04/25/2019  Falls in the past year? 1 1 1 1 1   Comment - - - - -  Number falls in past yr: 1 1 0 0 0  Injury with Fall? 1 0 0 0 0  Risk Factor Category  - - - - -  Risk for fall due to : Impaired balance/gait;History of fall(s) - Impaired balance/gait;Impaired mobility No Fall Risks -  Follow up Falls prevention discussed - Falls evaluation completed - -    Any stairs in or around the home? Yes  If so, are there any without handrails? No  Home free of loose throw rugs in walkways, pet beds, electrical cords, etc? Yes  Adequate lighting in your home to reduce risk of falls? Yes   ASSISTIVE DEVICES UTILIZED TO PREVENT FALLS:  Life alert? No  Use of a cane, walker or w/c? Yes  Grab bars in the bathroom? Yes  Shower chair or bench in shower? Yes  Elevated toilet seat or a handicapped toilet? No   TIMED UP AND GO:  Was the test performed? No . Phone visit   Cognitive Function:Patient is currently seeing Dr. for Parkinson's and for memory changes. MMSE - Mini Mental State Exam 11/07/2017  Orientation to time 5  Orientation to Place 5  Registration 3  Attention/ Calculation 3  Recall 3  Language- name 2 objects 2  Language- repeat 1    Language- follow 3 step command 3  Language- read & follow direction 1  Write a sentence 1  Copy design 1  Total score 28        Immunizations Immunization History  Administered Date(s) Administered  . Influenza Whole 01/31/2007, 12/24/2008, 12/30/2009  . Influenza,inj,Quad PF,6+ Mos 02/09/2014, 02/15/2015  . PFIZER SARS-COV-2 Vaccination 04/17/2019, 05/08/2019  . Pneumococcal Conjugate-13 02/09/2014  . Pneumococcal Polysaccharide-23 02/15/2015    TDAP status: Due, Education has been provided regarding the importance of this vaccine. Advised may receive this vaccine at local pharmacy or Health Dept. Aware to provide a copy of the vaccination record if obtained from local pharmacy or Health Dept. Verbalized acceptance and understanding.   Flu vaccine status: Due- Patient plans to get the vaccine soon  Pneumococcal vaccine status: Up to date   Covid-19 vaccine status: Completed vaccines  Qualifies for Shingles Vaccine? Yes   Zostavax completed No   Shingrix Completed?: No.    Education has been provided regarding the importance of this vaccine. Patient has been advised to call insurance company to determine out of pocket expense if they have not yet received this vaccine. Advised may also receive vaccine at local pharmacy or Health Dept. Verbalized acceptance and understanding.  Screening Tests Health Maintenance  Topic Date Due  . MAMMOGRAM  11/14/2018  . INFLUENZA VACCINE  11/02/2019  . TETANUS/TDAP  08/20/2020 (Originally 10/29/1964)  . Fecal DNA (Cologuard)  11/02/2020  . DEXA SCAN  Completed  . COVID-19 Vaccine  Completed  . Hepatitis C Screening  Completed  . PNA vac Low Risk Adult  Completed    Health Maintenance  Health Maintenance Due  Topic Date Due  . MAMMOGRAM  11/14/2018  . INFLUENZA VACCINE  11/02/2019  Colorectal cancer screening: Completed Cologuard 11/02/2017. Repeat every 3 years   Mammogram status: Ordered today. Pt provided with contact info  and advised to call to schedule appt.    Bone Density status: Ordered today. Pt provided with contact info and advised to call to schedule appt.  Lung Cancer Screening: (Low Dose CT Chest recommended if Age 72-80 years, 30 pack-year currently smoking OR have quit w/in 15years.) does not qualify.     Additional Screening:  Hepatitis C Screening: Completed 04/04/2007-Per patient  Vision Screening: Recommended annual ophthalmology exams for early detection of glaucoma and other disorders of the eye. Is the patient up to date with their annual eye exam?  Yes  Who is the provider or what is the name of the office in which the patient attends annual eye exams? Unsure of name  Dental Screening: Recommended annual dental exams for proper oral hygiene  Community Resource Referral / Chronic Care Management: CRR required this visit?  No   CCM required this visit?  No      Plan:     I have personally reviewed and noted the following in the patient's chart:   . Medical and social history . Use of alcohol, tobacco or illicit drugs  . Current medications and supplements . Functional ability and status . Nutritional status . Physical activity . Advanced directives . List of other physicians . Hospitalizations, surgeries, and ER visits in previous 12 months . Vitals . Screenings to include cognitive, depression, and falls . Referrals and appointments  In addition, I have reviewed and discussed with patient certain preventive protocols, quality metrics, and best practice recommendations. A written personalized care plan for preventive services as well as general preventive health recommendations were provided to patient.   Due to this being a telephonic visit, the after visit summary with patients personalized plan was offered to patient via mail or my-chart.  Patient would like to access on my-chart.   Roanna Raider, LPN   [id number]  Nurse Health Advisor  Nurse Notes: Patient  states she has had some recent memory changes. She currently sees Dr. Arbutus Leas. She has an upcoming appt with her in January. They have not let Dr. Don Perking office know of these changes. The patient & patient's son is asking that I call Dr. Don Perking office to let her know of the patient's memory concerns. I advised them that I will call but that they should call also.

## 2020-02-13 NOTE — Progress Notes (Signed)
Chronic Care Management Pharmacy  Name: Carly [name] MRN: [id number]   DOB: 1945/07/23  Chief Complaint/ HPI Carly [name], 74 y.o., female, presents for their initial CCM visit with the clinical pharmacist via telephone due to COVID-19 pandemic. Accompanied by sister, Carly [name] at time of call.   PCP : Sheliah Hatch, MD Encounter Diagnoses  Name Primary?  . Generalized anxiety disorder with panic attacks Yes  . Primary hypertension     Patient Active Problem List   Diagnosis Date Noted  . 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 Color vision defect 05/21/2019  . Decreased visual acuity 05/21/2019  . Vitamin D deficiency 11/07/2017  . Primary osteoarthritis of left knee 05/21/2017  . Degenerative arthritis of left knee 05/18/2017  . Anterior cervical lymphadenopathy 05/14/2015  . Osteopenia 02/15/2015  . B12 deficiency 01/01/2014  . Achilles tendinitis 01/31/2010  . Parkinson's disease 07/08/2008  . Tremor, right hand 04/07/2008  . Generalized anxiety disorder with panic attacks 01/31/2007  . HTN (hypertension) 01/31/2007   Past Surgical History:  Procedure Laterality Date  . BREAST BIOPSY    . DILATION AND CURETTAGE OF UTERUS    . EYE SURGERY Right 05/21/2018  . KNEE SURGERY Left 01/2017  . REFRACTIVE SURGERY Bilateral   . TOTAL KNEE ARTHROPLASTY Left 05/21/2017   Procedure: TOTAL KNEE ARTHROPLASTY;  Surgeon: Gean Birchwood, MD;  Location: [facility] OR;  Service: Orthopedics;  Laterality: Left;   Family History  Problem Relation Age of Onset  . Heart attack Father   . Heart failure Mother   . Cancer Brother        brain  . Cancer Brother        brain  . Healthy Sister   . Stroke Sister   . Arthritis Son    Allergies  Allergen Reactions  . Codeine Nausea Only and Other (See Comments)    hallucinations   Outpatient Encounter Medications as of 02/13/2020  Medication Sig  . carbidopa-levodopa (SINEMET IR) 25-100 MG tablet  Take 1 tablet by mouth 4 (four) times daily. (Patient taking differently: Take 1 tablet by mouth 4 (four) times daily. Breakfast, lunch, dinner, bedtime)  . citalopram (CELEXA) 10 MG tablet Take 1 tablet (10 mg total) by mouth daily. (Patient taking differently: Take 10 mg by mouth in the morning. )  . dorzolamide-timolol (COSOPT) 22.3-6.8 MG/ML ophthalmic solution Place 1 drop into both eyes 2 (two) times daily.   Marland Kitchen latanoprost (XALATAN) 0.005 % ophthalmic solution Place 1 drop into both eyes at bedtime.   . meloxicam (MOBIC) 15 MG tablet TAKE ONE TABLET BY MOUTH DAILY  . pramipexole (MIRAPEX) 0.5 MG tablet Take 1 tablet (0.5 mg total) by mouth 3 (three) times daily. (Patient taking differently: Take 0.5 mg by mouth 3 (three) times daily. Breakfast, lunch, bedtime.)  . ALPRAZolam (XANAX) 0.5 MG tablet Take 1 tablet (0.5 mg total) by mouth 2 (two) times daily as needed for anxiety.   No facility-administered encounter medications on file as of 02/13/2020.   Patient Care Team    Relationship Specialty Notifications Start End  Sheliah Hatch, MD PCP - General   03/16/10   Tat, Octaviano Batty, DO Consulting Physician Neurology  02/12/15   Salvatore Marvel, MD Consulting Physician Orthopedic Surgery  11/07/17   Gean Birchwood, MD Consulting Physician Orthopedic Surgery  11/07/17   Dahlia Byes, [facility] Pharmacist Pharmacist  01/01/20    Comment: Phone: [phone]   Current Diagnosis/Assessment: Goals Addressed  This Visit's Progress   . PharmD Care Plan       CARE PLAN ENTRY (see longitudinal plan of care for additional care plan information)  Current Barriers:  . Chronic Disease Management support, education, and care coordination needs related to Hypertension and Anxiety   Hypertension BP Readings from Last 3 Encounters:  11/27/19 (!) 152/73  07/07/19 (!) 155/70  04/25/19 130/70   . Pharmacist Clinical Goal(s): o Over the next 180 days, patient will work with PharmD and providers  to achieve BP goal <140/90 . Current regimen:  o No medications at this time . Interventions: o Discussed home BP monitoring recommendations . Patient self care activities - Over the next 180 days, patient will: o Check BP at least once every 1-2 weeks, document, and provide at future appointments o Ensure daily salt intake < 2300 mg/day  Anxiety . Pharmacist Clinical Goal(s) o Over the next 180 days, patient will work with PharmD and providers to minimize anxiety symptoms, ensure medication safety and accessibility. . Current regimen:  o Citalopram 10 mg once daily o Alprazolam for as needed use . Interventions: o Reviewed medication for ongoing benefit/side effects . Patient self care activities - Over the next 180 days, patient will: o Continue current management o  Medication management . Pharmacist Clinical Goal(s): o Over the next 180 days, patient will work with PharmD and providers to achieve optimal medication adherence . Current pharmacy: [facility] . Interventions o Comprehensive medication review performed. o Continue current medication management strategy . Patient self care activities - Over the next 180 days, patient will: o Take medications as prescribed o Report any questions or concerns to PharmD and/or provider(s) Initial goal documentation.      Hypertension   BP goal <140/90  BP Readings from Last 3 Encounters:  11/27/19 (!) 152/73  07/07/19 (!) 155/70  04/25/19 130/70   Previous medications: HCTZ Pt has home monitoring but checking infrequently. Recent home readings: n/a.  Plans to start monitoring 1-2x/week moving forward.  Patient is currently above goal on the following medications:  . n/a  No specific diet followed at this time.   Plan  Continue current medications. F/u scheduled with PCP for physical 03/2020.  Osteopenia   Last DEXA Scan: 2016 - osteopenia.  VITD  Date Value Ref Range Status  11/07/2017 31.80 30.00 - 100.00 ng/mL  Final    Current medications: . n/a  We discussed: Recommend [phone] units of vitamin D daily. Recommend 1200 mg of calcium daily from dietary and supplemental sources. Recommend weight-bearing and muscle strengthening exercises for building and maintaining bone density..  Plan  Continue current medications.  Depression/Anxiety   PHQ9 SCORE ONLY 02/13/2020 08/21/2019 07/30/2018  PHQ-9 Total Score 0 4 0   Citalopram restarted 08/2019 visit. Previous medications: Sertraline. Denies side effects, reports ongoing benefit of taking medication. Has not needed to used alprazolam over past couple of months.  Patient is currently controlled on the following medications:  . Citalopram 10 mg once daily . Alprazolam 0.5 mg twice daily as needed for anxiety  Plan  Continue current medications.  Vaccines   Immunization History  Administered Date(s) Administered  . Influenza Whole 01/31/2007, 12/24/2008, 12/30/2009  . Influenza,inj,Quad PF,6+ Mos 02/09/2014, 02/15/2015  . PFIZER SARS-COV-2 Vaccination 04/17/2019, 05/08/2019  . Pneumococcal Conjugate-13 02/09/2014  . Pneumococcal Polysaccharide-23 02/15/2015   Reviewed patient's vaccination history, was not discussed at time of visit.  Plan  Recommend patient receive shingrix, pfizer booster, annual flu shot as applicable.  Medication Management /  Care Coordination   Receives prescription medications from:  [id number] - [place], [place] - [address] [facility] [address] [place] [address] Phone: [phone] Fax: [phone]  Karin Golden [facility] [address] [place], [place] - [address] [place] [address] Phone: [phone] Fax: [phone]   Several questions/concerns regarding medication management, has missed several doses of PD medications over last three days. Discussed medication management including delivery, synchronization. Has hero  device for pill dispensing at home, plans to manual load pills into device.  Plan  Utilize [facility] for medication synchronization, packaging and delivery. ___________________________ SDOH (Social Determinants of Health) assessments performed: Yes.  Future Appointments  Date Time Provider Department Center  02/16/2020  3:45 PM [facility]  03/03/2020 10:00 AM Sheliah Hatch, MD [facility]  05/31/2020  8:15 AM Tat, Octaviano Batty, DO [facility] None   Verbal consent obtained for [facility] enhanced pharmacy services (medication synchronization, adherence packaging, delivery coordination). A medication sync plan was created to allow patient to get all medications delivered once every 30 to 90 days per patient preference. Patient understands they have freedom to choose pharmacy and clinical pharmacist will coordinate care between all prescribers and [facility].  Visit follow-up:  . CPA follow-up: Onboarding form. Marland Kitchen RPH follow-up: 6 month telephone visit, monthly review of medication changes/pharmacy dispensing.  Dahlia Byes, Pharm.D., BCGP Clinical Pharmacist [facility] [phone]

## 2020-02-16 ENCOUNTER — Ambulatory Visit (INDEPENDENT_AMBULATORY_CARE_PROVIDER_SITE_OTHER): Payer: Medicare HMO

## 2020-02-16 ENCOUNTER — Telehealth: Payer: Self-pay

## 2020-02-16 VITALS — Ht 65.0 in | Wt 181.0 lb

## 2020-02-16 DIAGNOSIS — Z0000 Encounter for general adult medical examination without abnormal findings: Secondary | ICD-10-CM | POA: Diagnosis not present

## 2020-02-16 DIAGNOSIS — Z1231 Encounter for screening mammogram for malignant neoplasm of breast: Secondary | ICD-10-CM

## 2020-02-16 DIAGNOSIS — Z780 Asymptomatic menopausal state: Secondary | ICD-10-CM

## 2020-02-16 NOTE — Patient Instructions (Signed)
Carly [name] , Thank you for taking time to complete your Medicare Wellness Visit. I appreciate your ongoing commitment to your health goals. Please review the following plan we discussed and let me know if I can assist you in the future.   Screening recommendations/referrals: Colonoscopy: Completed Cologuard 11/02/2017-Due 11/02/2020 Mammogram: Ordered today. Someone will be calling you to schedule. Bone Density: Ordered today. Someone will be calling you to schedule. Recommended yearly ophthalmology/optometry visit for glaucoma screening and checkup Recommended yearly dental visit for hygiene and checkup  Vaccinations: Influenza vaccine: Due- May obtain at our office or your local pharmacy Pneumococcal vaccine: Completed vaccines Tdap vaccine: Discuss with pharmacy Shingles vaccine: Discuss with pharmacy Covid-19:Completed vaccines  Advanced directives: Please bring a copy for your chart  Conditions/risks identified: See problem list  Next appointment: Follow up in one year for your annual wellness visit    Preventive Care 65 Years and Older, Female Preventive care refers to lifestyle choices and visits with your health care provider that can promote health and wellness. What does preventive care include?  A yearly physical exam. This is also called an annual well check.  Dental exams once or twice a year.  Routine eye exams. Ask your health care provider how often you should have your eyes checked.  Personal lifestyle choices, including:  Daily care of your teeth and gums.  Regular physical activity.  Eating a healthy diet.  Avoiding tobacco and drug use.  Limiting alcohol use.  Practicing safe sex.  Taking low-dose aspirin every day.  Taking vitamin and mineral supplements as recommended by your health care provider. What happens during an annual well check? The services and screenings done by your health care provider during your annual well check will depend on  your age, overall health, lifestyle risk factors, and family history of disease. Counseling  Your health care provider may ask you questions about your:  Alcohol use.  Tobacco use.  Drug use.  Emotional well-being.  Home and relationship well-being.  Sexual activity.  Eating habits.  History of falls.  Memory and ability to understand (cognition).  Work and work Astronomer.  Reproductive health. Screening  You may have the following tests or measurements:  Height, weight, and BMI.  Blood pressure.  Lipid and cholesterol levels. These may be checked every 5 years, or more frequently if you are over 62 years old.  Skin check.  Lung cancer screening. You may have this screening every year starting at age 63 if you have a 30-pack-year history of smoking and currently smoke or have quit within the past 15 years.  Fecal occult blood test (FOBT) of the stool. You may have this test every year starting at age 15.  Flexible sigmoidoscopy or colonoscopy. You may have a sigmoidoscopy every 5 years or a colonoscopy every 10 years starting at age 43.  Hepatitis C blood test.  Hepatitis B blood test.  Sexually transmitted disease (STD) testing.  Diabetes screening. This is done by checking your blood sugar (glucose) after you have not eaten for a while (fasting). You may have this done every 1-3 years.  Bone density scan. This is done to screen for osteoporosis. You may have this done starting at age 21.  Mammogram. This may be done every 1-2 years. Talk to your health care provider about how often you should have regular mammograms. Talk with your health care provider about your test results, treatment options, and if necessary, the need for more tests. Vaccines  Your health care  provider may recommend certain vaccines, such as:  Influenza vaccine. This is recommended every year.  Tetanus, diphtheria, and acellular pertussis (Tdap, Td) vaccine. You may need a Td booster  every 10 years.  Zoster vaccine. You may need this after age 38.  Pneumococcal 13-valent conjugate (PCV13) vaccine. One dose is recommended after age 12.  Pneumococcal polysaccharide (PPSV23) vaccine. One dose is recommended after age 80. Talk to your health care provider about which screenings and vaccines you need and how often you need them. This information is not intended to replace advice given to you by your health care provider. Make sure you discuss any questions you have with your health care provider. Document Released: 04/16/2015 Document Revised: 12/08/2015 Document Reviewed: 01/19/2015 Elsevier Interactive Patient Education  2017 [place] Prevention in the Home Falls can cause injuries. They can happen to people of all ages. There are many things you can do to make your home safe and to help prevent falls. What can I do on the outside of my home?  Regularly fix the edges of walkways and driveways and fix any cracks.  Remove anything that might make you trip as you walk through a door, such as a raised step or threshold.  Trim any bushes or trees on the path to your home.  Use bright outdoor lighting.  Clear any walking paths of anything that might make someone trip, such as rocks or tools.  Regularly check to see if handrails are loose or broken. Make sure that both sides of any steps have handrails.  Any raised decks and porches should have guardrails on the edges.  Have any leaves, snow, or ice cleared regularly.  Use sand or salt on walking paths during winter.  Clean up any spills in your garage right away. This includes oil or grease spills. What can I do in the bathroom?  Use night lights.  Install grab bars by the toilet and in the tub and shower. Do not use towel bars as grab bars.  Use non-skid mats or decals in the tub or shower.  If you need to sit down in the shower, use a plastic, non-slip stool.  Keep the floor dry. Clean up any  water that spills on the floor as soon as it happens.  Remove soap buildup in the tub or shower regularly.  Attach bath mats securely with double-sided non-slip rug tape.  Do not have throw rugs and other things on the floor that can make you trip. What can I do in the bedroom?  Use night lights.  Make sure that you have a light by your bed that is easy to reach.  Do not use any sheets or blankets that are too big for your bed. They should not hang down onto the floor.  Have a firm chair that has side arms. You can use this for support while you get dressed.  Do not have throw rugs and other things on the floor that can make you trip. What can I do in the kitchen?  Clean up any spills right away.  Avoid walking on wet floors.  Keep items that you use a lot in easy-to-reach places.  If you need to reach something above you, use a strong step stool that has a grab bar.  Keep electrical cords out of the way.  Do not use floor polish or wax that makes floors slippery. If you must use wax, use non-skid floor wax.  Do not have throw  rugs and other things on the floor that can make you trip. What can I do with my stairs?  Do not leave any items on the stairs.  Make sure that there are handrails on both sides of the stairs and use them. Fix handrails that are broken or loose. Make sure that handrails are as long as the stairways.  Check any carpeting to make sure that it is firmly attached to the stairs. Fix any carpet that is loose or worn.  Avoid having throw rugs at the top or bottom of the stairs. If you do have throw rugs, attach them to the floor with carpet tape.  Make sure that you have a light switch at the top of the stairs and the bottom of the stairs. If you do not have them, ask someone to add them for you. What else can I do to help prevent falls?  Wear shoes that:  Do not have high heels.  Have rubber bottoms.  Are comfortable and fit you well.  Are closed  at the toe. Do not wear sandals.  If you use a stepladder:  Make sure that it is fully opened. Do not climb a closed stepladder.  Make sure that both sides of the stepladder are locked into place.  Ask someone to hold it for you, if possible.  Clearly mark and make sure that you can see:  Any grab bars or handrails.  First and last steps.  Where the edge of each step is.  Use tools that help you move around (mobility aids) if they are needed. These include:  Canes.  Walkers.  Scooters.  Crutches.  Turn on the lights when you go into a dark area. Replace any light bulbs as soon as they burn out.  Set up your furniture so you have a clear path. Avoid moving your furniture around.  If any of your floors are uneven, fix them.  If there are any pets around you, be aware of where they are.  Review your medicines with your doctor. Some medicines can make you feel dizzy. This can increase your chance of falling. Ask your doctor what other things that you can do to help prevent falls. This information is not intended to replace advice given to you by your health care provider. Make sure you discuss any questions you have with your health care provider. Document Released: 01/14/2009 Document Revised: 08/26/2015 Document Reviewed: 04/24/2014 Elsevier Interactive Patient Education  2017 [employer].

## 2020-02-16 NOTE — Progress Notes (Signed)
Spoke with [facility] pharmacy and had medications transferred to [facility].  Onboarding Form in step 1 folder for CPP to review .  Carly [name] ,[facility] Clinical Pharmacist Assistant [phone]

## 2020-02-17 ENCOUNTER — Other Ambulatory Visit: Payer: Self-pay

## 2020-02-17 ENCOUNTER — Telehealth: Payer: Self-pay | Admitting: Neurology

## 2020-02-17 DIAGNOSIS — M1712 Unilateral primary osteoarthritis, left knee: Secondary | ICD-10-CM

## 2020-02-17 MED ORDER — MELOXICAM 15 MG PO TABS: 15.0000 mg | ORAL_TABLET | Freq: Every day | ORAL | 0 refills | Status: DC

## 2020-02-17 NOTE — Telephone Encounter (Signed)
Patient's sister called to report that patient admitted to her that she's fallen 3 times (sister states she was only aware of 1 of the falls). Sister and other family are with patient every day setting out her daily medications in a pill box and when they need to be taken, so they know she has missed some doses. They have also noticed some cognitive changes with her, she will be clear one minute and will have trouble the next. Sister reports patient has noticed the changes as well. She asked to move her appointment up with Dr Tat, she is sch 02/24/20. Told sister she isn't on DPR but we can send the message back and she said if we have additional questions we can call patient's son.

## 2020-02-17 NOTE — Telephone Encounter (Signed)
Someone needs to come with her to appointments to hear what we discuss Her appt is next week.  I believe there may be a cx Friday at 8am and they can take that if available, but otherwise we will see her next week I don't see that they have seen PCP in a long time.  Its important that they see PCP as well as me.

## 2020-02-17 NOTE — Telephone Encounter (Signed)
Reviewed patients chart and informed sister that she is not on the DPR and I can not speak with her. She states her and the patient had a family meeting and she will be adding her to the [employer]   Advised patients sister that I would speak with patient directly. She states the patient can be hard to reach but suggested I keep trying if I have any difficulties.

## 2020-02-17 NOTE — Telephone Encounter (Signed)
Spoke with patient and gave her Dr Don Perking recommendations. She states she is going to contact her son and call the office right back. She states she wants to move her appt to Friday at 8am, but she needs to speak with her son first.

## 2020-02-18 NOTE — Progress Notes (Signed)
Assessment/Plan:   1.  Parkinsons Disease  -Take carbidopa/levodopa 25/100, 1 tablet at 6am/10am/2pm/6pm  -Continue carbidopa/levodopa 50/200 at bedtime  -was going to stop pramipexole but not sure that she decreased it last visit so patient to call me with dosage and make sure that she lowered it.  Will readdress next visit  -discussed alarmed pill box.  -As with last visit, I discussed with her the importance of using a walker and not a cane at all times.  -discussed direct med monitor.  Discussed living situation.    2.  Parkinson's dyskinesia  -No medication necessary.  I think that this would just cause confusion.  3.  B12 deficiency  -On oral supplementation.  4.  Memory change  -Patient had neurocognitive testing in June, 2021.  That demonstrated MCI.  She may have transition to PDD given family reports.  All medications should be directly monitored.  Am working on getting her off of pramipexole because of memory change.   Subjective:   Carly [name] was seen today in follow up for Parkinsons disease.  My previous records were reviewed prior to todays visit as well as outside records available to me.  Patient last seen at end of August. Son with patient and supplements the history.   Patient sister called a few days ago (not on DPR) to state that the patient had fallen 3 times.  Pt denies lightheadedness, near syncope.  No hallucinations.  Mood has been good.  Family is setting out medication in a pillbox, and 1 sister called here she stated that she is noticing some medication dosages have been missed.  They were noticing some cognitive changes. Son states today that he is sure that carbidopa/levodopa 25/100 not being taken qid.  They think that they may be taking carbidopa/levodopa 25/100 tid and the carbidopa/levodopa 50/200 q hs.  Not sure if they decreased the pramipexole last visit or not.   Last visit, we had sent her a referral to [facility], but her insurance  did not take that, so we redirected the referral to another location in [facility].  She reports that she never got therapy because she fell and broke her wrist and that had to have surgery in [place].  Son states that yesterday pt took a xanax and she was out of it.  Current prescribed movement disorder medications: Carbidopa/levodopa 25/100, 1 tablet at 6 AM/10 AM/2 PM/6 PM Carbidopa/levodopa 50/200 at bedtime Pramipexole 0.5 mg, 1 tablet 3 times per day (decreased last visit)    ALLERGIES:   Allergies  Allergen Reactions  . Codeine Nausea Only and Other (See Comments)    hallucinations    CURRENT MEDICATIONS:  Outpatient Encounter Medications as of 02/20/2020  Medication Sig  . ALPRAZolam (XANAX) 0.5 MG tablet Take 1 tablet (0.5 mg total) by mouth 2 (two) times daily as needed for anxiety.  . carbidopa-levodopa (SINEMET IR) 25-100 MG tablet Take 1 tablet by mouth 4 (four) times daily. (Patient taking differently: Take 1 tablet by mouth 4 (four) times daily. Breakfast, lunch, dinner, bedtime)  . citalopram (CELEXA) 10 MG tablet Take 1 tablet (10 mg total) by mouth daily. (Patient taking differently: Take 10 mg by mouth in the morning. )  . dorzolamide-timolol (COSOPT) 22.3-6.8 MG/ML ophthalmic solution Place 1 drop into both eyes 2 (two) times daily.   Marland Kitchen latanoprost (XALATAN) 0.005 % ophthalmic solution Place 1 drop into both eyes at bedtime.   . meloxicam (MOBIC) 15 MG tablet Take 1  tablet (15 mg total) by mouth daily.  . pramipexole (MIRAPEX) 0.5 MG tablet Take 1 tablet (0.5 mg total) by mouth 3 (three) times daily. (Patient taking differently: Take 0.5 mg by mouth 3 (three) times daily. Breakfast, lunch, bedtime.)   No facility-administered encounter medications on file as of 02/20/2020.    Objective:   PHYSICAL EXAMINATION:    VITALS:   Vitals:   02/20/20 0803  BP: (!) 175/77  Pulse: (!) 59  SpO2: 97%  Weight: 187 lb (84.8 kg)  Height: 5\' 6"  (1.676 m)    GEN:   The patient appears stated age and is in NAD. HEENT:  Normocephalic, atraumatic.  The mucous membranes are moist. The superficial temporal arteries are without ropiness or tenderness. CV:  RRR Lungs:  CTAB Neck/HEME:  There are no carotid bruits bilaterally.  Neurological examination:  Orientation: The patient is alert and oriented x3. Cranial nerves: There is good facial symmetry with facial hypomimia. The speech is fluent and clear. Soft palate rises symmetrically and there is no tongue deviation. Hearing is intact to conversational tone. Sensation: Sensation is intact to light touch throughout Motor: Strength is at least antigravity x4.  Movement examination: Tone: There is nl tone in the UE/LE Abnormal movements: occ dyskinesia Coordination:  There is mild decremation with RAM's Gait and Station: The patient has mild difficulty arising out of a deep-seated chair without the use of the hands. The patient's stride length is mild decreased but better as she walks.    I have reviewed and interpreted the following labs independently    Chemistry      Component Value Date/Time   NA 139 11/07/2017 1037   K 4.2 11/07/2017 1037   CL 103 11/07/2017 1037   CO2 29 11/07/2017 1037   BUN 15 11/07/2017 1037   CREATININE 1.06 11/07/2017 1037      Component Value Date/Time   CALCIUM 9.6 11/07/2017 1037   ALKPHOS 87 11/07/2017 1037   AST 16 11/07/2017 1037   ALT 4 11/07/2017 1037   BILITOT 1.1 11/07/2017 1037       Lab Results  Component Value Date   WBC 6.4 11/07/2017   HGB 13.1 11/07/2017   HCT 38.9 11/07/2017   MCV 88.1 11/07/2017   PLT 152.0 11/07/2017    Lab Results  Component Value Date   TSH 1.15 11/07/2017     Total time spent on today's visit was 45 minutes, including both face-to-face time and nonface-to-face time.  Time included that spent on review of records (prior notes available to me/labs/imaging if pertinent), discussing treatment and goals, answering  patient's questions and coordinating care.  Cc:  01/07/2018, MD

## 2020-02-20 ENCOUNTER — Ambulatory Visit: Payer: Medicare HMO | Admitting: Neurology

## 2020-02-20 ENCOUNTER — Other Ambulatory Visit: Payer: Self-pay

## 2020-02-20 ENCOUNTER — Encounter: Payer: Self-pay | Admitting: Neurology

## 2020-02-20 VITALS — BP 175/77 | HR 59 | Ht 66.0 in | Wt 187.0 lb

## 2020-02-20 DIAGNOSIS — G249 Dystonia, unspecified: Secondary | ICD-10-CM

## 2020-02-20 DIAGNOSIS — R296 Repeated falls: Secondary | ICD-10-CM | POA: Diagnosis not present

## 2020-02-20 DIAGNOSIS — G20 Parkinson's disease: Secondary | ICD-10-CM

## 2020-02-20 MED ORDER — CARBIDOPA-LEVODOPA ER 50-200 MG PO TBCR: 1.0000 | EXTENDED_RELEASE_TABLET | Freq: Every day | ORAL | 1 refills | Status: DC

## 2020-02-20 MED ORDER — CARBIDOPA-LEVODOPA 25-100 MG PO TABS: 1.0000 | ORAL_TABLET | Freq: Four times a day (QID) | ORAL | 1 refills | Status: DC

## 2020-02-20 NOTE — Patient Instructions (Addendum)
1.  STOP alprazalam (xanax) 2.  Take carbidopa/levodopa 25/100, 1 tablet at 6am/10am/2pm/6pm 3.  Take carbidopa/levodopa 50/200 at bedtime 4.  I will refer home physical therapy.   5.  All medications need directly monitored 6.  Call me with your pramipexole dosage.  You should be taking 0.5 mg three times per day (6am/2pm/6pm is fine)  The physicians and staff at [facility] are committed to providing excellent care. You may receive a survey requesting feedback about your experience at our office. We strive to receive "very good" responses to the survey questions. If you feel that your experience would prevent you from giving the office a "very good " response, please contact our office to try to remedy the situation. We may be reached at [phone]. Thank you for taking the time out of your busy day to complete the survey.

## 2020-02-23 ENCOUNTER — Ambulatory Visit: Payer: Medicare HMO | Admitting: Neurology

## 2020-02-24 ENCOUNTER — Other Ambulatory Visit: Payer: Self-pay | Admitting: Neurology

## 2020-03-03 ENCOUNTER — Other Ambulatory Visit: Payer: Self-pay

## 2020-03-03 ENCOUNTER — Encounter: Payer: Self-pay | Admitting: Family Medicine

## 2020-03-03 ENCOUNTER — Ambulatory Visit (INDEPENDENT_AMBULATORY_CARE_PROVIDER_SITE_OTHER): Payer: Medicare HMO | Admitting: Family Medicine

## 2020-03-03 VITALS — BP 126/80 | HR 56 | Temp 97.5°F | Resp 17 | Ht 64.0 in | Wt 185.6 lb

## 2020-03-03 DIAGNOSIS — E559 Vitamin D deficiency, unspecified: Secondary | ICD-10-CM

## 2020-03-03 DIAGNOSIS — E663 Overweight: Secondary | ICD-10-CM | POA: Diagnosis not present

## 2020-03-03 DIAGNOSIS — M8580 Other specified disorders of bone density and structure, unspecified site: Secondary | ICD-10-CM | POA: Diagnosis not present

## 2020-03-03 DIAGNOSIS — Z0000 Encounter for general adult medical examination without abnormal findings: Secondary | ICD-10-CM | POA: Diagnosis not present

## 2020-03-03 DIAGNOSIS — L989 Disorder of the skin and subcutaneous tissue, unspecified: Secondary | ICD-10-CM | POA: Diagnosis not present

## 2020-03-03 DIAGNOSIS — E538 Deficiency of other specified B group vitamins: Secondary | ICD-10-CM

## 2020-03-03 DIAGNOSIS — E669 Obesity, unspecified: Secondary | ICD-10-CM | POA: Insufficient documentation

## 2020-03-03 LAB — CBC WITH DIFFERENTIAL/PLATELET
Basophils Absolute: 0.1 10*3/uL (ref 0.0–0.1)
Basophils Relative: 0.7 % (ref 0.0–3.0)
Eosinophils Absolute: 0.1 10*3/uL (ref 0.0–0.7)
Eosinophils Relative: 1.1 % (ref 0.0–5.0)
HCT: 39.4 % (ref 36.0–46.0)
Hemoglobin: 12.9 g/dL (ref 12.0–15.0)
Lymphocytes Relative: 19 % (ref 12.0–46.0)
Lymphs Abs: 1.6 10*3/uL (ref 0.7–4.0)
MCHC: 32.9 g/dL (ref 30.0–36.0)
MCV: 91.2 fl (ref 78.0–100.0)
Monocytes Absolute: 0.5 10*3/uL (ref 0.1–1.0)
Monocytes Relative: 5.8 % (ref 3.0–12.0)
Neutro Abs: 6.2 10*3/uL (ref 1.4–7.7)
Neutrophils Relative %: 73.4 % (ref 43.0–77.0)
Platelets: 146 10*3/uL — ABNORMAL LOW (ref 150.0–400.0)
RBC: 4.32 Mil/uL (ref 3.87–5.11)
RDW: 13.1 % (ref 11.5–15.5)
WBC: 8.5 10*3/uL (ref 4.0–10.5)

## 2020-03-03 LAB — HEPATIC FUNCTION PANEL
ALT: 5 U/L (ref 0–35)
AST: 16 U/L (ref 0–37)
Albumin: 4.2 g/dL (ref 3.5–5.2)
Alkaline Phosphatase: 94 U/L (ref 39–117)
Bilirubin, Direct: 0.2 mg/dL (ref 0.0–0.3)
Total Bilirubin: 0.8 mg/dL (ref 0.2–1.2)
Total Protein: 7.1 g/dL (ref 6.0–8.3)

## 2020-03-03 LAB — LIPID PANEL
Cholesterol: 176 mg/dL (ref 0–200)
HDL: 65.5 mg/dL (ref >39.00)
LDL Cholesterol: 98 mg/dL (ref 0–99)
NonHDL: 110.12
Total CHOL/HDL Ratio: 3
Triglycerides: 61 mg/dL (ref 0.0–149.0)
VLDL: 12.2 mg/dL (ref 0.0–40.0)

## 2020-03-03 LAB — BASIC METABOLIC PANEL
BUN: 25 mg/dL — ABNORMAL HIGH (ref 6–23)
CO2: 30 mEq/L (ref 19–32)
Calcium: 9.2 mg/dL (ref 8.4–10.5)
Chloride: 102 mEq/L (ref 96–112)
Creatinine, Ser: 1.24 mg/dL — ABNORMAL HIGH (ref 0.40–1.20)
GFR: 43.53 mL/min — ABNORMAL LOW (ref >60.00)
Glucose, Bld: 104 mg/dL — ABNORMAL HIGH (ref 70–99)
Potassium: 4.8 mEq/L (ref 3.5–5.1)
Sodium: 137 mEq/L (ref 135–145)

## 2020-03-03 LAB — VITAMIN D 25 HYDROXY (VIT D DEFICIENCY, FRACTURES): VITD: 25.86 ng/mL — ABNORMAL LOW (ref 30.00–100.00)

## 2020-03-03 LAB — VITAMIN B12: Vitamin B-12: 225 pg/mL (ref 211–911)

## 2020-03-03 LAB — TSH: TSH: 0.96 u[IU]/mL (ref 0.35–4.50)

## 2020-03-03 NOTE — Patient Instructions (Signed)
Follow up in 1 year or as needed We'll notify you of your lab results and make any changes if needed Please schedule your mammogram at your convenience We'll call you with your dermatology appt Continue to work on healthy diet and exercise as able Call with any questions or concerns Stay Safe! Stay Healthy! Happy Holidays!!!

## 2020-03-03 NOTE — Assessment & Plan Note (Signed)
Ongoing issue for pt.  Limited in her ability to exercise due to Parkinsons.  Discussed healthy diet.  Check labs to risk stratify.  Will follow.

## 2020-03-03 NOTE — Assessment & Plan Note (Signed)
Check labs and replete prn. 

## 2020-03-03 NOTE — Assessment & Plan Note (Signed)
Pt's PE unchanged from previous w/ exception of skin lesion on R MCP joint.  Due for mammo- pt to schedule.  UTD on cologuard.  Check labs.  Anticipatory guidance provided.

## 2020-03-03 NOTE — Progress Notes (Signed)
   Subjective:    Patient ID: Carly [name], female    DOB: 1945-10-05, 74 y.o.   MRN: [id number]  HPI CPE- due for mammo (ordered at [facility] visit).  Cologuard is UTD.  Pneumonia vaccines UTD, COVID vaccines UTD.  Due for flu today.  Reviewed past medical, surgical, family and social histories.   Patient Care Team    Relationship Specialty Notifications Start End  Sheliah Hatch, MD PCP - General   03/16/10   Tat, Octaviano Batty, DO Consulting Physician Neurology  02/12/15   Salvatore Marvel, MD Consulting Physician Orthopedic Surgery  11/07/17   Gean Birchwood, MD Consulting Physician Orthopedic Surgery  11/07/17   Dahlia Byes, [facility] Pharmacist Pharmacist  01/01/20    Comment: Phone: [phone]     Health Maintenance  Topic Date Due  . MAMMOGRAM  11/14/2018  . INFLUENZA VACCINE  11/02/2019  . TETANUS/TDAP  08/20/2020 (Originally 10/29/1966)  . Fecal DNA (Cologuard)  11/02/2020  . DEXA SCAN  Completed  . COVID-19 Vaccine  Completed  . Hepatitis C Screening  Completed  . PNA vac Low Risk Adult  Completed      Review of Systems Patient reports no vision/ hearing changes, adenopathy,fever, weight change,  persistant/recurrent hoarseness , swallowing issues, chest pain, palpitations, edema, persistant/recurrent cough, hemoptysis, dyspnea (rest/exertional/paroxysmal nocturnal), gastrointestinal bleeding (melena, rectal bleeding), abdominal pain, significant heartburn, bowel changes, GU symptoms (dysuria, hematuria, incontinence), Gyn symptoms (abnormal  bleeding, pain),  syncope, focal weakness, memory loss, numbness & tingling, skin/hair/nail changes, abnormal bruising or bleeding, anxiety, or depression.   This visit occurred during the SARS-CoV-2 public health emergency.  Safety protocols were in place, including screening questions prior to the visit, additional usage of staff PPE, and extensive cleaning of exam room while observing appropriate contact time as indicated for disinfecting  solutions.       Objective:   Physical Exam General Appearance:    Alert, cooperative, no distress, appears stated age  Head:    Normocephalic, without obvious abnormality, atraumatic  Eyes:    PERRL, conjunctiva/corneas clear, EOM's intact, fundi    benign, both eyes  Ears:    Normal TM's and external ear canals, both ears  Nose:   Deferred due to COVID  Throat:   Neck:   Supple, symmetrical, trachea midline, no adenopathy;    Thyroid: no enlargement/tenderness/nodules  Back:     Symmetric, no curvature, ROM normal, no CVA tenderness  Lungs:     Clear to auscultation bilaterally, respirations unlabored  Chest Wall:    No tenderness or deformity   Heart:    Regular rate and rhythm, S1 and S2 normal, no murmur, rub   or gallop  Breast Exam:    Deferred  Abdomen:     Soft, non-tender, bowel sounds active all four quadrants,    no masses, no organomegaly  Genitalia:    Deferred  Rectal:    Extremities:   Extremities normal, atraumatic, no cyanosis or edema  Pulses:   2+ and symmetric all extremities  Skin:   Skin color, texture, turgor normal, no rashes.  Scaling lesion on R middle MCP joint  Lymph nodes:   Cervical, supraclavicular, and axillary nodes normal  Neurologic:   CNII-XII intact, normal strength, sensation and reflexes    throughout          Assessment & Plan:

## 2020-03-05 ENCOUNTER — Other Ambulatory Visit: Payer: Self-pay

## 2020-03-05 DIAGNOSIS — E559 Vitamin D deficiency, unspecified: Secondary | ICD-10-CM

## 2020-03-05 MED ORDER — VITAMIN D (ERGOCALCIFEROL) 1.25 MG (50000 UNIT) PO CAPS: 50000.0000 [IU] | ORAL_CAPSULE | ORAL | 0 refills | Status: DC

## 2020-03-15 ENCOUNTER — Other Ambulatory Visit: Payer: Self-pay

## 2020-03-18 ENCOUNTER — Other Ambulatory Visit: Payer: Self-pay | Admitting: Family Medicine

## 2020-04-08 NOTE — Progress Notes (Deleted)
Assessment/Plan:   1.  Parkinsons Disease  -***Take carbidopa/levodopa 25/100, 1 tablet at 6am/10am/2pm/6pm             -Continue carbidopa/levodopa 50/200 at bedtime             -***Pramipexole  2.  Parkinson's dyskinesia             -No medication necessary.  Medication for dyskinesia would just cause confusion.  3.  B12 deficiency             -On oral supplementation.  4.  Memory change             -Patient had neurocognitive testing in June, 2021.  That demonstrated MCI.  She may have transition to PDD given family reports.  All medications should be directly monitored.  Am working on getting her off of pramipexole because of memory change.  5.  B12 deficiency  -pt with evidence of B12 deficiency.  Discussed with the patient that neurologically, would like to see B12 levels greater than 400.  Would recommend oral B12 supplementation, 1000 mcg daily.  Subjective:   Carly [name] was seen today in follow up for Parkinsons disease.  My previous records were reviewed prior to todays visit as well as outside records available to me.  When I saw the patient last visit, my goal was to stop her pramipexole, but she was not sure that she had decreased it from the prior time.  Therefore, I told her to call me and let me know what dosage she was on so that I could appropriately wean the medication.  She never did that.  Today, she states that ***.  pt denies falls.  Pt denies lightheadedness, near syncope.  No hallucinations.  Mood has been good.  She saw Dr. Beverely Low on December 1.  Those notes are reviewed.  Patient's B12 level was low.  Current prescribed movement disorder medications: ***Carbidopa/levodopa 25/100, 1 tablet at 6 AM/10 AM/2 PM/6 PM Carbidopa/levodopa 50/200 at bedtime Pramipexole 0.5 mg, 1 tablet 3 times per day    PREVIOUS MEDICATIONS: {Parkinson's RX:18200}  ALLERGIES:   Allergies  Allergen Reactions  . Codeine Nausea Only and Other (See Comments)     hallucinations    CURRENT MEDICATIONS:  Outpatient Encounter Medications as of 04/12/2020  Medication Sig  . carbidopa-levodopa (SINEMET CR) 50-200 MG tablet Take 1 tablet by mouth at bedtime.  . carbidopa-levodopa (SINEMET IR) 25-100 MG tablet Take 1 tablet by mouth 4 (four) times daily. 6am/10am/2pm/6pm  . citalopram (CELEXA) 10 MG tablet TAKE ONE TABLET BY MOUTH ONCE DAILY  . dorzolamide-timolol (COSOPT) 22.3-6.8 MG/ML ophthalmic solution Place 1 drop into both eyes 2 (two) times daily.   Marland Kitchen latanoprost (XALATAN) 0.005 % ophthalmic solution Place 1 drop into both eyes at bedtime.   . meloxicam (MOBIC) 15 MG tablet Take 1 tablet (15 mg total) by mouth daily.  . pramipexole (MIRAPEX) 0.5 MG tablet Take 1 tablet (0.5 mg total) by mouth 3 (three) times daily. (Patient taking differently: Take 0.5 mg by mouth 3 (three) times daily. Breakfast, lunch, bedtime.)  . Vitamin D, Ergocalciferol, (DRISDOL) 1.25 MG (50000 UNIT) CAPS capsule Take 1 capsule (50,000 Units total) by mouth every 7 (seven) days.   No facility-administered encounter medications on file as of 04/12/2020.    Objective:   PHYSICAL EXAMINATION:    VITALS:  There were no vitals filed for this visit.  GEN:  The patient appears stated age and is in NAD.  HEENT:  Normocephalic, atraumatic.  The mucous membranes are moist. The superficial temporal arteries are without ropiness or tenderness. CV:  RRR Lungs:  CTAB Neck/HEME:  There are no carotid bruits bilaterally.  Neurological examination:  Orientation: The patient is alert and oriented x3. Cranial nerves: There is good facial symmetry with*** facial hypomimia. The speech is fluent and clear. Soft palate rises symmetrically and there is no tongue deviation. Hearing is intact to conversational tone. Sensation: Sensation is intact to light touch throughout Motor: Strength is at least antigravity x4.  Movement examination: Tone: There is ***tone in the *** Abnormal movements:  *** Coordination:  There is *** decremation with RAM's, *** Gait and Station: The patient has *** difficulty arising out of a deep-seated chair without the use of the hands. The patient's stride length is ***.  The patient has a *** pull test.     I have reviewed and interpreted the following labs independently    Chemistry      Component Value Date/Time   NA 137 03/03/2020 1035   K 4.8 03/03/2020 1035   CL 102 03/03/2020 1035   CO2 30 03/03/2020 1035   BUN 25 (H) 03/03/2020 1035   CREATININE 1.24 (H) 03/03/2020 1035      Component Value Date/Time   CALCIUM 9.2 03/03/2020 1035   ALKPHOS 94 03/03/2020 1035   AST 16 03/03/2020 1035   ALT 5 03/03/2020 1035   BILITOT 0.8 03/03/2020 1035       Lab Results  Component Value Date   WBC 8.5 03/03/2020   HGB 12.9 03/03/2020   HCT 39.4 03/03/2020   MCV 91.2 03/03/2020   PLT 146.0 (L) 03/03/2020    Lab Results  Component Value Date   TSH 0.96 03/03/2020   Lab Results  Component Value Date   VITAMINB12 225 03/03/2020     Total time spent on today's visit was ***30 minutes, including both face-to-face time and nonface-to-face time.  Time included that spent on review of records (prior notes available to me/labs/imaging if pertinent), discussing treatment and goals, answering patient's questions and coordinating care.  Cc:  Sheliah Hatch, MD

## 2020-04-12 ENCOUNTER — Ambulatory Visit: Payer: Medicare HMO | Admitting: Neurology

## 2020-04-14 ENCOUNTER — Other Ambulatory Visit: Payer: Self-pay | Admitting: Neurology

## 2020-04-22 ENCOUNTER — Telehealth: Payer: Self-pay

## 2020-04-22 NOTE — Chronic Care Management (AMB) (Signed)
    Chronic Care Management Pharmacy Assistant   Name: Carly [name]  MRN: [id number] DOB: Oct 01, 1945  Reason for Encounter: Medication Review  PCP : Sheliah Hatch, MD  Allergies:   Allergies  Allergen Reactions  . Codeine Nausea Only and Other (See Comments)    hallucinations    Medications: Outpatient Encounter Medications as of 04/22/2020  Medication Sig  . carbidopa-levodopa (SINEMET CR) 50-200 MG tablet Take 1 tablet by mouth at bedtime.  . carbidopa-levodopa (SINEMET IR) 25-100 MG tablet Take 1 tablet by mouth 4 (four) times daily. 6am/10am/2pm/6pm  . citalopram (CELEXA) 10 MG tablet TAKE ONE TABLET BY MOUTH ONCE DAILY  . dorzolamide-timolol (COSOPT) 22.3-6.8 MG/ML ophthalmic solution Place 1 drop into both eyes 2 (two) times daily.   Marland Kitchen latanoprost (XALATAN) 0.005 % ophthalmic solution Place 1 drop into both eyes at bedtime.   . meloxicam (MOBIC) 15 MG tablet Take 1 tablet (15 mg total) by mouth daily.  . pramipexole (MIRAPEX) 0.5 MG tablet Take 1 tablet (0.5 mg total) by mouth 3 (three) times daily. (Patient taking differently: Take 0.5 mg by mouth 3 (three) times daily. Breakfast, lunch, bedtime.)  . Vitamin D, Ergocalciferol, (DRISDOL) 1.25 MG (50000 UNIT) CAPS capsule Take 1 capsule (50,000 Units total) by mouth every 7 (seven) days.   No facility-administered encounter medications on file as of 04/22/2020.    Current Diagnosis: Patient Active Problem List   Diagnosis Date Noted  . Obesity (BMI 30-39.9) 03/03/2020  . 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 Color vision defect 05/21/2019  . Decreased visual acuity 05/21/2019  . Vitamin D deficiency 11/07/2017  . Primary osteoarthritis of left knee 05/21/2017  . Degenerative arthritis of left knee 05/18/2017  . Anterior cervical lymphadenopathy 05/14/2015  . Osteopenia 02/15/2015  . Physical exam 02/09/2014  . B12 deficiency 01/01/2014  . Achilles tendinitis 01/31/2010  .  Parkinson's disease 07/08/2008  . Tremor, right hand 04/07/2008  . Generalized anxiety disorder with panic attacks 01/31/2007  . HTN (hypertension) 01/31/2007    Reviewed chart for medication changes ahead of medication coordination call.  02/16/2020 OV Roanna Raider, LPN Medicare Annual Wellness Exam,  02/20/2020 OV Neurology, Tat, Octaviano Batty, DO, no medication changes noted during these visits.  BP Readings from Last 3 Encounters:  03/03/20 126/80  02/20/20 (!) 175/77  11/27/19 (!) 152/73    Lab Results  Component Value Date   HGBA1C 5.8 (H) 08/29/2013     **Several unsuccessful attempts were made to reach out to the patient for her medication refills this month. Chart review performed before attempts were made.**  April Amie Critchley, [facility] Clinical Pharmacist Assistant [phone]    Follow-Up:  Pharmacist Review

## 2020-04-29 ENCOUNTER — Other Ambulatory Visit: Payer: Self-pay | Admitting: Family Medicine

## 2020-05-04 ENCOUNTER — Telehealth: Payer: Self-pay

## 2020-05-04 NOTE — Telephone Encounter (Signed)
[facility] was [facility] on 04/30/20

## 2020-05-05 ENCOUNTER — Telehealth: Payer: Self-pay

## 2020-05-05 NOTE — Telephone Encounter (Signed)
Attempted to reach patient since August 2021. Unable to leave message due to voicemail box being full. Patient no showed her last appointment and canceled her upcoming appointment.   Handicap placard that was completed will be discarded.   Dr Tat made aware.

## 2020-05-11 DIAGNOSIS — W19XXXA Unspecified fall, initial encounter: Secondary | ICD-10-CM | POA: Diagnosis not present

## 2020-05-11 DIAGNOSIS — M545 Low back pain, unspecified: Secondary | ICD-10-CM | POA: Diagnosis not present

## 2020-05-11 DIAGNOSIS — M25551 Pain in right hip: Secondary | ICD-10-CM | POA: Diagnosis not present

## 2020-05-11 DIAGNOSIS — M5459 Other low back pain: Secondary | ICD-10-CM | POA: Diagnosis not present

## 2020-05-12 ENCOUNTER — Telehealth: Payer: Self-pay | Admitting: Neurology

## 2020-05-12 NOTE — Telephone Encounter (Signed)
Patient's son called to report a bad fall yesterday. He'd like to know if Dr. Arbutus Leas can refer her for home PT or a rehab center for awhile. He said she lives in a 3 level house and is having a hard time negotiating that.Marland Kitchen

## 2020-05-13 NOTE — Progress Notes (Signed)
Virtual Visit Via Video   The purpose of this virtual visit is to provide medical care while limiting exposure to the novel coronavirus.    Consent was obtained for video visit:  Yes.   Answered questions that patient had about telehealth interaction:  Yes.   I discussed the limitations, risks, security and privacy concerns of performing an evaluation and management service by telemedicine. I also discussed with the patient that there may be a patient responsible charge related to this service. The patient expressed understanding and agreed to proceed.  Pt location: Home Physician Location: office Name of referring provider:  Sheliah Hatch, MD I connected with Carly [name] at patients initiation/request on 05/14/2020 at 10:15 AM EST by video enabled telemedicine application and verified that I am speaking with the correct person using two identifiers. Pt MRN:  [id number] Pt DOB:  23-Apr-1945 Video Participants:  Carly [name];  son [place] supplements hx; granddaughter and sister present and supplement history as well.  Assessment/Plan:    1.  Parkinsons Disease  -We will increase carbidopa/levodopa 25/100, 2 tablets at 6 AM, 2 tablets at 10 AM, 1 tablet at 2 PM, 1 tablet at 6 PM  -We will wean off pramipexole.  Specific weaning instructions were given.  I reset her MyChart password so that family was able to get to wait.  Patient gave me permission to do so.             -Continue carbidopa/levodopa 50/200 at bedtime  -Encouraged the family to get rid of the throw rugs in the home as well as decrease the furniture load.  -Encouraged the patient to use her walker at all times.  -We will send physical therapy to the home.     2.  Parkinson's dyskinesia             -No medication necessary.  Medication for dyskinesia would just cause confusion.   3.  B12 deficiency             -On oral supplementation.   4.  Memory change             -Patient had neurocognitive testing in  June, 2021.  That demonstrated MCI.  She may have transition to PDD given family reports.  All medications should be directly monitored.  Am working on getting her off of pramipexole because of memory change.  -Family needs to be involved with her visits and make sure she gets to appointments.  Discussed alternative living situations and in-home care.  -Family asked about anxiety contributing.  It certainly could.  Asked him to follow-up with primary care regarding her anxiety.  5.  B12 deficiency  -pt with evidence of B12 deficiency.  Discussed with the patient that neurologically, would like to see B12 levels greater than 400.  Would recommend oral B12 supplementation, 1000 mcg daily.  Subjective:   Carly [name] was seen today in follow up for Parkinsons disease.  My previous records were reviewed prior to todays visit as well as outside records available to me. When I saw the patient last visit, my goal was to stop her pramipexole, but she was not sure that she had decreased it from the prior time.  Therefore, I told her to call me and let me know what dosage she was on so that I could appropriately wean the medication.  She never did that. Family states that she is on pramipexole 0.5 mg tid.   In addition, she  no showed her visit with me on January 10.  Son thinks that she didn't want to come.  Her son then called me yesterday to ask me for referral for physical therapy and so I worked her in again today.  They have no paid caregivers in the home.  They have family/friends help in the home.  She has no hallucinations but has some visual distortions.  Family thinks that patient has focus/concentration issues.  Pt has to have meds directly distributed.  She had a fall backwards 2 days ago.  Was already standing up and "jerked back."  She hit the corner of the desk.  She hit the hip.  xrays neg and ems came.  Hasn't been consistently using the walker in the home but "we are using it."  She has a  u-step but she just got that this week.     She saw Dr. Beverely Low on December 1.  Those notes are reviewed.  Patient's B12 level was low.  Current prescribed movement disorder medications: Carbidopa/levodopa 25/100, 1 tablet at 6 AM/10 AM/2 PM/6 PM Carbidopa/levodopa 50/200 at bedtime Pramipexole 0.5 mg, 1 tablet 3 times per day    ALLERGIES:   Allergies  Allergen Reactions   Codeine Nausea Only and Other (See Comments)    hallucinations    CURRENT MEDICATIONS:  Outpatient Encounter Medications as of 05/14/2020  Medication Sig   carbidopa-levodopa (SINEMET CR) 50-200 MG tablet Take 1 tablet by mouth at bedtime.   carbidopa-levodopa (SINEMET IR) 25-100 MG tablet Take 1 tablet by mouth 4 (four) times daily. 6am/10am/2pm/6pm   citalopram (CELEXA) 10 MG tablet TAKE ONE TABLET BY MOUTH ONCE DAILY   dorzolamide-timolol (COSOPT) 22.3-6.8 MG/ML ophthalmic solution Place 1 drop into both eyes 2 (two) times daily.    latanoprost (XALATAN) 0.005 % ophthalmic solution Place 1 drop into both eyes at bedtime.    meloxicam (MOBIC) 15 MG tablet Take 1 tablet (15 mg total) by mouth daily.   pramipexole (MIRAPEX) 0.5 MG tablet Take 1 tablet (0.5 mg total) by mouth 3 (three) times daily. (Patient taking differently: Take 0.5 mg by mouth 3 (three) times daily. Breakfast, lunch, bedtime.)   Vitamin D, Ergocalciferol, (DRISDOL) 1.25 MG (50000 UNIT) CAPS capsule Take 1 capsule (50,000 Units total) by mouth every 7 (seven) days.   No facility-administered encounter medications on file as of 05/14/2020.    Objective:   PHYSICAL EXAMINATION:    VITALS:   Vitals:   05/14/20 0936  Weight: 175 lb (79.4 kg)  Height: 5\' 4"  (1.626 m)    GEN:  The patient appears stated age and is in NAD.  Neurological examination:  Orientation: The patient is alert and oriented x3. Cranial nerves: There is good facial symmetry. There is facial hypomimia.  The speech is fluent and clear. Soft palate rises  symmetrically and there is no tongue deviation. Hearing is intact to conversational tone. Motor: Strength is at least antigravity x 4.   Shoulder shrug is equal and symmetric.  There is no pronator drift.  Movement examination: Tone: unable Abnormal movements: None seen Coordination:  There is mild decremation with RAM's, mostly with finger taps bilaterally. Gait and Station: The patient has difficulty arising out of a deep-seated chair.  She is encouraged by her family to push off of the chair, and she is ultimately able to get up.  She is given a walker.  She does fairly well with a U step in her home.   I have reviewed and interpreted  the following labs independently    Chemistry      Component Value Date/Time   NA 137 03/03/2020 1035   K 4.8 03/03/2020 1035   CL 102 03/03/2020 1035   CO2 30 03/03/2020 1035   BUN 25 (H) 03/03/2020 1035   CREATININE 1.24 (H) 03/03/2020 1035      Component Value Date/Time   CALCIUM 9.2 03/03/2020 1035   ALKPHOS 94 03/03/2020 1035   AST 16 03/03/2020 1035   ALT 5 03/03/2020 1035   BILITOT 0.8 03/03/2020 1035       Lab Results  Component Value Date   WBC 8.5 03/03/2020   HGB 12.9 03/03/2020   HCT 39.4 03/03/2020   MCV 91.2 03/03/2020   PLT 146.0 (L) 03/03/2020    Lab Results  Component Value Date   TSH 0.96 03/03/2020    Follow up Instructions      -I discussed the assessment and treatment plan with the patient. The patient was provided an opportunity to ask questions and all were answered. The patient agreed with the plan and demonstrated an understanding of the instructions.   The patient was advised to call back or seek an in-person evaluation if the symptoms worsen or if the condition fails to improve as anticipated.    Total time spent on today's visit was 45 minutes, including both face-to-face time and nonface-to-face time.  Time included that spent on review of records (prior notes available to me/labs/imaging if  pertinent), discussing treatment and goals, answering patient's questions and coordinating care.   Kerin Salen, DO  Cc:  Sheliah Hatch, MD

## 2020-05-13 NOTE — Telephone Encounter (Signed)
Spoke with patient's son. He agreed to set his mom up for a VV with Dr Tat 05/14/20. He will be there with her for the visit and will make sure that there is video so that Dr Tat can assess patient appropriately. I let him know that Rhae Hammock will call him either today or tomorrow to update patient's chart, he agreed that it would be better to speak with him as his mom isn't really clear right now and he could answer most of the questions.

## 2020-05-13 NOTE — Telephone Encounter (Signed)
I'm sorry to hear this.  She recently no showed a visit with me so I don't have much to document to support need for PT.  Can she get to an appointment with me on Friday?  I don't think that she has one scheduled here either.  It sounds like they/family may need to look into a higher level of care for her as she has been having trouble with falls/remembering meds/getting to appts/etc

## 2020-05-14 ENCOUNTER — Encounter: Payer: Self-pay | Admitting: Neurology

## 2020-05-14 ENCOUNTER — Telehealth (INDEPENDENT_AMBULATORY_CARE_PROVIDER_SITE_OTHER): Payer: Medicare HMO | Admitting: Neurology

## 2020-05-14 ENCOUNTER — Other Ambulatory Visit: Payer: Self-pay

## 2020-05-14 VITALS — Ht 64.0 in | Wt 175.0 lb

## 2020-05-14 DIAGNOSIS — G20 Parkinson's disease: Secondary | ICD-10-CM | POA: Diagnosis not present

## 2020-05-14 DIAGNOSIS — G249 Dystonia, unspecified: Secondary | ICD-10-CM | POA: Diagnosis not present

## 2020-05-14 MED ORDER — CARBIDOPA-LEVODOPA 25-100 MG PO TABS
ORAL_TABLET | ORAL | 1 refills | Status: DC
Start: 2020-05-14 — End: 2020-11-01

## 2020-05-14 NOTE — Addendum Note (Signed)
Addended by: Kandice Robinsons T on: 05/14/2020 10:58 AM   Modules accepted: Orders

## 2020-05-14 NOTE — Patient Instructions (Signed)
Week 1 Take carbidopa/levodopa 25/100, 2 tablets at 6 AM, 1 tablet at 10 AM, 1 tablet at 2 PM, 1 tablet at 6 PM Take carbidopa/levodopa 50/200 at bedtime Take pramipexole, 0.5 mg, 1 tablet twice at 6 AM and 6 PM  Week 2: Take carbidopa/levodopa 25/100, 2 tablets at 6 AM, 2 tablets at 10 AM, 1 tablet at 2 PM, 1 tablet at 6 PM Take carbidopa/levodopa 50/200 at bedtime Take pramipexole 0.5 mg, 1 tablet daily at 6 AM  Week 3 and beyond: Take carbidopa/levodopa 25/100, 2 tablets at 6 AM, 2 tablets at 10 AM, 1 tablet at 2 PM, 1 tablet at 6 PM Take carbidopa/levodopa 50/200 at bedtime STOP pramipexole  If you are not already doing so, take B12, 1000 mcg daily.  You get this over-the-counter.  Follow-up with Dr. Beverely Low regarding anxiety.

## 2020-05-17 ENCOUNTER — Other Ambulatory Visit: Payer: Self-pay | Admitting: Family Medicine

## 2020-05-17 ENCOUNTER — Telehealth: Payer: Self-pay

## 2020-05-17 ENCOUNTER — Telehealth: Payer: Self-pay | Admitting: Neurology

## 2020-05-17 DIAGNOSIS — E559 Vitamin D deficiency, unspecified: Secondary | ICD-10-CM

## 2020-05-17 NOTE — Telephone Encounter (Signed)
[facility] is about to refill some meds for the patient. They called because they need a prescription for Carbidopa Levodopa 50-200mg  to be sent to them. They stated they have the other carbidopa levodopa prescription, but not one for the 50-200. Please send.

## 2020-05-17 NOTE — Chronic Care Management (AMB) (Signed)
° ° °  Chronic Care Management Pharmacy Assistant   Name: Carly [name]  MRN: [id number] DOB: [birth year]  Reason for Encounter: Medication Review   PCP : Sheliah Hatch, MD  Allergies:   Allergies  Allergen Reactions   Codeine Nausea Only and Other (See Comments)    hallucinations    Medications: Outpatient Encounter Medications as of 05/17/2020  Medication Sig   carbidopa-levodopa (SINEMET CR) 50-200 MG tablet Take 1 tablet by mouth at bedtime.   carbidopa-levodopa (SINEMET IR) 25-100 MG tablet 2 at 6 AM/2 at 10 AM/1 at 2 PM/1 at 6 PM   citalopram (CELEXA) 10 MG tablet TAKE ONE TABLET BY MOUTH ONCE DAILY   dorzolamide-timolol (COSOPT) 22.3-6.8 MG/ML ophthalmic solution Place 1 drop into both eyes 2 (two) times daily.    latanoprost (XALATAN) 0.005 % ophthalmic solution Place 1 drop into both eyes at bedtime.    meloxicam (MOBIC) 15 MG tablet Take 1 tablet (15 mg total) by mouth daily.   pramipexole (MIRAPEX) 0.5 MG tablet Take 1 tablet (0.5 mg total) by mouth 3 (three) times daily. (Patient taking differently: Take 0.5 mg by mouth 3 (three) times daily. Breakfast, lunch, bedtime.)   Vitamin D, Ergocalciferol, (DRISDOL) 1.25 MG (50000 UNIT) CAPS capsule Take 1 capsule (50,000 Units total) by mouth every 7 (seven) days.   No facility-administered encounter medications on file as of 05/17/2020.    Current Diagnosis: Patient Active Problem List   Diagnosis Date Noted   Obesity (BMI 30-39.9) 03/03/2020   Mild neurocognitive disorder due to Parkinson's disease 09/30/2019   Color vision defect 05/21/2019   Decreased visual acuity 05/21/2019   Vitamin D deficiency 11/07/2017   Primary osteoarthritis of left knee 05/21/2017   Degenerative arthritis of left knee 05/18/2017   Anterior cervical lymphadenopathy 05/14/2015   Osteopenia 02/15/2015   Physical exam 02/09/2014   B12 deficiency 01/01/2014   Achilles tendinitis 01/31/2010   Parkinson's disease  07/08/2008   Tremor, right hand 04/07/2008   Generalized anxiety disorder with panic attacks 01/31/2007   HTN (hypertension) 01/31/2007    Reviewed chart for medication changes ahead of medication coordination call.  Medication increase carbidopa/levodopa 25/100, 2 tablets at 6 AM, 2 tablets at 10 AM, 1 tablet at 2 PM, 1 tablet at 6 PM, Continue carbidopa/levodopa 50/200 at bedtime,  Pramipexole has been discontinued, weaning instructions given.  BP Readings from Last 3 Encounters:  03/03/20 126/80  02/20/20 (!) 175/77  11/27/19 (!) 152/73    Lab Results  Component Value Date   HGBA1C 5.8 (H) 08/29/2013     Patient obtains medications through Vials  90 Days   Called and spoke with patients sister Carly [name] and reviewed medications and coordinated delivery.  Coordinated acute fill for: carbidopa/levodopa 25/100 2 tablets at 6 AM, 2 tablets at 10 AM, 1 tablet at 2 PM, 1 tablet at 6 PM, carbidopa/levodopa 50/200 at bedtime Vitamin B12 1,000 mcg daily  Confirmed delivery date of 05/18/2020, advised patients sister that pharmacy will contact them the morning of delivery.  April D Calhoun, [facility] Clinical Pharmacist Assistant [phone]   Follow-Up:  Coordination of Enhanced Pharmacy Services and Pharmacist Review

## 2020-05-18 MED ORDER — CARBIDOPA-LEVODOPA ER 50-200 MG PO TBCR: 1.0000 | EXTENDED_RELEASE_TABLET | Freq: Every day | ORAL | 1 refills | Status: DC

## 2020-05-18 NOTE — Addendum Note (Signed)
Addended by: Kandice Robinsons T on: 05/18/2020 01:25 PM   Modules accepted: Orders

## 2020-05-24 ENCOUNTER — Telehealth: Payer: Self-pay

## 2020-05-24 NOTE — Chronic Care Management (AMB) (Signed)
   Chronic Care Management Pharmacy Assistant   Name: Carly [name]  MRN: [id number] DOB: Aug 25, 1945  Reason for Encounter: Medication Review  PCP : Sheliah Hatch, MD  Allergies:   Allergies  Allergen Reactions  . Codeine Nausea Only and Other (See Comments)    hallucinations    Medications: Outpatient Encounter Medications as of 05/24/2020  Medication Sig  . carbidopa-levodopa (SINEMET CR) 50-200 MG tablet Take 1 tablet by mouth at bedtime.  . carbidopa-levodopa (SINEMET IR) 25-100 MG tablet 2 at 6 AM/2 at 10 AM/1 at 2 PM/1 at 6 PM  . citalopram (CELEXA) 10 MG tablet TAKE ONE TABLET BY MOUTH ONCE DAILY  . latanoprost (XALATAN) 0.005 % ophthalmic solution Place 1 drop into both eyes at bedtime.   . meloxicam (MOBIC) 15 MG tablet Take 1 tablet (15 mg total) by mouth daily.  . pramipexole (MIRAPEX) 0.5 MG tablet Take 1 tablet (0.5 mg total) by mouth 3 (three) times daily. (Patient taking differently: Take 0.5 mg by mouth 3 (three) times daily. Breakfast, lunch, bedtime.)  . Vitamin D, Ergocalciferol, (DRISDOL) 1.25 MG (50000 UNIT) CAPS capsule Take 1 capsule (50,000 Units total) by mouth every 7 (seven) days.   No facility-administered encounter medications on file as of 05/24/2020.    Current Diagnosis: Patient Active Problem List   Diagnosis Date Noted  . Obesity (BMI 30-39.9) 03/03/2020  . 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 Color vision defect 05/21/2019  . Decreased visual acuity 05/21/2019  . Vitamin D deficiency 11/07/2017  . Primary osteoarthritis of left knee 05/21/2017  . Degenerative arthritis of left knee 05/18/2017  . Anterior cervical lymphadenopathy 05/14/2015  . Osteopenia 02/15/2015  . Physical exam 02/09/2014  . B12 deficiency 01/01/2014  . Achilles tendinitis 01/31/2010  . Parkinson's disease 07/08/2008  . Tremor, right hand 04/07/2008  . Generalized anxiety disorder with panic attacks 01/31/2007  . HTN  (hypertension) 01/31/2007    Reviewed chart for medication changes ahead of medication coordination call.  No OVs, Consults, or hospital visits since last care coordination call/Pharmacist visit. (If appropriate, list visit date, provider name)  No medication changes indicated OR if recent visit, treatment plan here.  BP Readings from Last 3 Encounters:  03/03/20 126/80  02/20/20 (!) 175/77  11/27/19 (!) 152/73    Lab Results  Component Value Date   HGBA1C 5.8 (H) 08/29/2013     Patient obtains medications through Vials  30 Days   Last adherence delivery included:  carbidopa/levodopa 25/100 2 tablets at 6 AM, 2 tablets at 10 AM, 1 tablet at 2 PM, 1 tablet at 6 PM, carbidopa/levodopa 50/200 at bedtime Vitamin B12 1,000 mcg daily  Patient is due for next adherence delivery on: Called patient and reviewed medications and coordinated delivery.  This delivery to include:   Patient declined the following medications (meds) due to (reason)  Patient needs refills for   Confirmed delivery date of, advised patient that pharmacy will contact them the morning of delivery.  April D Calhoun, [facility] Clinical Pharmacist Assistant [phone]   **Patient did not return my call the month of February 2022 to complete this medication review**  Follow-Up:  Occupational hygienist and Pharmacist Review

## 2020-05-25 ENCOUNTER — Telehealth: Payer: Self-pay | Admitting: Family Medicine

## 2020-05-25 DIAGNOSIS — Z9181 History of falling: Secondary | ICD-10-CM | POA: Diagnosis not present

## 2020-05-25 DIAGNOSIS — E669 Obesity, unspecified: Secondary | ICD-10-CM | POA: Diagnosis not present

## 2020-05-25 DIAGNOSIS — M1712 Unilateral primary osteoarthritis, left knee: Secondary | ICD-10-CM | POA: Diagnosis not present

## 2020-05-25 DIAGNOSIS — R69 Illness, unspecified: Secondary | ICD-10-CM | POA: Diagnosis not present

## 2020-05-25 DIAGNOSIS — G20 Parkinson's disease: Secondary | ICD-10-CM | POA: Diagnosis not present

## 2020-05-25 DIAGNOSIS — M8580 Other specified disorders of bone density and structure, unspecified site: Secondary | ICD-10-CM | POA: Diagnosis not present

## 2020-05-25 DIAGNOSIS — E538 Deficiency of other specified B group vitamins: Secondary | ICD-10-CM | POA: Diagnosis not present

## 2020-05-25 DIAGNOSIS — Z6839 Body mass index (BMI) 39.0-39.9, adult: Secondary | ICD-10-CM | POA: Diagnosis not present

## 2020-05-25 NOTE — Telephone Encounter (Signed)
..  Home Health Verbal Orders  Agency:  [facility]  Caller: Esaw Dace and title   Requesting OT/ PT/ Skilled nursing/ Social Work/ Speech:  Physical Therapy & referral for a medical social worker and a nurse to do med teaching Reason for Request:    Frequency:   1 x a week x 9 weeks  HH needs F2F w/in last 30 days

## 2020-05-26 NOTE — Telephone Encounter (Signed)
Home Health verbal orders needed for patient.

## 2020-05-26 NOTE — Telephone Encounter (Signed)
Ok for verbal orders but I have not seen pt in last 30 days so she would need to schedule an appt

## 2020-05-27 ENCOUNTER — Telehealth: Payer: Self-pay

## 2020-05-27 NOTE — Telephone Encounter (Signed)
Lauren at [facility] left a voicemail stating she received a referral for patient requesting a verbal order for next week.   Cell: [phone] Office: [phone]

## 2020-05-27 NOTE — Telephone Encounter (Signed)
Spoke with Leotis Shames and she states the physical therapist Shanda Bumps contacted Dr Rennis Golden office and was give verbal orders for PT for patient.

## 2020-05-27 NOTE — Telephone Encounter (Signed)
Carly [name] with [facility] to give verbal orders for OT/PT, skilled nursing, social work, speech therapy,physical therapy and a referral for a medical social worker and a Engineer, civil (consulting) to do med teaching. Reached out to patient to get her scheduled but patiet did not answer nor was I able to leave a vm. I called sons phone and left a vm with him stating that she needs to be seen by provider per provider. Victorino Dike from [facility] stated that when she goes and sees patient that she will also inform her that she needs to schedule an appt with pcp. Victorino Dike stated that she will fax over forms to be signed.

## 2020-05-30 DIAGNOSIS — Z6839 Body mass index (BMI) 39.0-39.9, adult: Secondary | ICD-10-CM | POA: Diagnosis not present

## 2020-05-30 DIAGNOSIS — M1712 Unilateral primary osteoarthritis, left knee: Secondary | ICD-10-CM | POA: Diagnosis not present

## 2020-05-30 DIAGNOSIS — E669 Obesity, unspecified: Secondary | ICD-10-CM | POA: Diagnosis not present

## 2020-05-30 DIAGNOSIS — G20 Parkinson's disease: Secondary | ICD-10-CM | POA: Diagnosis not present

## 2020-05-30 DIAGNOSIS — E538 Deficiency of other specified B group vitamins: Secondary | ICD-10-CM | POA: Diagnosis not present

## 2020-05-30 DIAGNOSIS — M8580 Other specified disorders of bone density and structure, unspecified site: Secondary | ICD-10-CM | POA: Diagnosis not present

## 2020-05-30 DIAGNOSIS — Z9181 History of falling: Secondary | ICD-10-CM | POA: Diagnosis not present

## 2020-05-30 DIAGNOSIS — R69 Illness, unspecified: Secondary | ICD-10-CM | POA: Diagnosis not present

## 2020-05-31 ENCOUNTER — Telehealth: Payer: Self-pay | Admitting: Family Medicine

## 2020-05-31 ENCOUNTER — Ambulatory Visit: Payer: Medicare HMO | Admitting: Neurology

## 2020-05-31 NOTE — Telephone Encounter (Signed)
..  Home Health Certification or Plan of Care Tracking  Is this a Certification or Plan of Care?  yes  HH Agency:[facility]  Order Number:  [id number] Has charge sheet been attached? yes  Where has form been placed:  Placed in Dr. Rennis Golden bin up front

## 2020-05-31 NOTE — Telephone Encounter (Signed)
Forms placed in bin to be filled 

## 2020-06-01 DIAGNOSIS — F411 Generalized anxiety disorder: Secondary | ICD-10-CM

## 2020-06-01 DIAGNOSIS — R69 Illness, unspecified: Secondary | ICD-10-CM | POA: Diagnosis not present

## 2020-06-01 DIAGNOSIS — E669 Obesity, unspecified: Secondary | ICD-10-CM | POA: Diagnosis not present

## 2020-06-01 DIAGNOSIS — G20 Parkinson's disease: Secondary | ICD-10-CM | POA: Diagnosis not present

## 2020-06-01 DIAGNOSIS — Z9181 History of falling: Secondary | ICD-10-CM

## 2020-06-01 DIAGNOSIS — E538 Deficiency of other specified B group vitamins: Secondary | ICD-10-CM | POA: Diagnosis not present

## 2020-06-01 DIAGNOSIS — Z6839 Body mass index (BMI) 39.0-39.9, adult: Secondary | ICD-10-CM

## 2020-06-01 DIAGNOSIS — M1712 Unilateral primary osteoarthritis, left knee: Secondary | ICD-10-CM | POA: Diagnosis not present

## 2020-06-01 DIAGNOSIS — M8580 Other specified disorders of bone density and structure, unspecified site: Secondary | ICD-10-CM | POA: Diagnosis not present

## 2020-06-02 ENCOUNTER — Ambulatory Visit (INDEPENDENT_AMBULATORY_CARE_PROVIDER_SITE_OTHER): Payer: Medicare HMO | Admitting: Family Medicine

## 2020-06-02 ENCOUNTER — Encounter: Payer: Self-pay | Admitting: Family Medicine

## 2020-06-02 ENCOUNTER — Other Ambulatory Visit: Payer: Self-pay

## 2020-06-02 VITALS — BP 140/80 | HR 58 | Temp 97.5°F | Resp 17 | Ht 64.0 in | Wt 180.8 lb

## 2020-06-02 DIAGNOSIS — F411 Generalized anxiety disorder: Secondary | ICD-10-CM

## 2020-06-02 DIAGNOSIS — R69 Illness, unspecified: Secondary | ICD-10-CM | POA: Diagnosis not present

## 2020-06-02 MED ORDER — BUSPIRONE HCL 7.5 MG PO TABS: 7.5000 mg | ORAL_TABLET | Freq: Two times a day (BID) | ORAL | 3 refills | Status: DC

## 2020-06-02 NOTE — Telephone Encounter (Signed)
Form completed and placed in basket  

## 2020-06-02 NOTE — Progress Notes (Signed)
   Subjective:    Patient ID: Carly [name], female    DOB: 1945-11-26, 75 y.o.   MRN: [id number]  HPI Anxiety- pt has had multiple falls recently and this has been understandably upsetting to her.  Her Parkinson's is progressing and she feels that soon she will be 'homebound'.  She is to be walking w/ her walker at all times but today she is using a cane.  She and family have decided it's time to sell her house as it is 3 stories and not safe for her.  They are considering 1 story homes, apartments.  Sister is not leaving her alone at this time.   Review of Systems For ROS see HPI   This visit occurred during the SARS-CoV-2 public health emergency.  Safety protocols were in place, including screening questions prior to the visit, additional usage of staff PPE, and extensive cleaning of exam room while observing appropriate contact time as indicated for disinfecting solutions.       Objective:   Physical Exam Vitals reviewed.  Constitutional:      General: She is not in acute distress.    Appearance: Normal appearance. She is not ill-appearing.  HENT:     Head: Normocephalic and atraumatic.     Mouth/Throat:     Comments: Soft voice Skin:    General: Skin is warm and dry.  Neurological:     Mental Status: She is alert and oriented to person, place, and time.     Cranial Nerves: No cranial nerve deficit.     Gait: Gait abnormal.  Psychiatric:        Mood and Affect: Mood normal.        Behavior: Behavior normal.        Thought Content: Thought content normal.           Assessment & Plan:

## 2020-06-02 NOTE — Telephone Encounter (Signed)
I have faxed and sent to scan.

## 2020-06-02 NOTE — Patient Instructions (Signed)
Follow up in 3-4 weeks to recheck anxiety START the Buspar (Buspirone) twice daily in addition to the Citalopram Please make sure you process these feelings- counseling, church, a support group- just don't hold them in Make sure you use the social worker to help w/ resources USE THAT WALKER!!! Call with any questions or concerns Hang in there!!!

## 2020-06-03 NOTE — Assessment & Plan Note (Signed)
Deteriorated.  Had long discussion w/ pt and sister that her current anxiety is understandable given the uncertainty she is facing.  She is planning on selling her house and moving into a more appropriate/accessible place.  I mentioned the possibility of assisted living.  Sister says they have met w/ a social worker and they are waiting on resources/information to review.  Told pt she must use her walker at all times to minimize risk of falls and to help her feel more stable.  Encouraged her to work with PT to preserve her mobility as long as possible.  She is already on Citalopram so we will add Buspar BID to help w/ her high anxiety levels w/o increasing her risk of falls.  Will follow very closely as pt navigates this difficult journey. 

## 2020-06-08 DIAGNOSIS — Z9181 History of falling: Secondary | ICD-10-CM | POA: Diagnosis not present

## 2020-06-08 DIAGNOSIS — E669 Obesity, unspecified: Secondary | ICD-10-CM | POA: Diagnosis not present

## 2020-06-08 DIAGNOSIS — Z6839 Body mass index (BMI) 39.0-39.9, adult: Secondary | ICD-10-CM | POA: Diagnosis not present

## 2020-06-08 DIAGNOSIS — M1712 Unilateral primary osteoarthritis, left knee: Secondary | ICD-10-CM | POA: Diagnosis not present

## 2020-06-08 DIAGNOSIS — M8580 Other specified disorders of bone density and structure, unspecified site: Secondary | ICD-10-CM | POA: Diagnosis not present

## 2020-06-08 DIAGNOSIS — G20 Parkinson's disease: Secondary | ICD-10-CM | POA: Diagnosis not present

## 2020-06-08 DIAGNOSIS — E538 Deficiency of other specified B group vitamins: Secondary | ICD-10-CM | POA: Diagnosis not present

## 2020-06-08 DIAGNOSIS — R69 Illness, unspecified: Secondary | ICD-10-CM | POA: Diagnosis not present

## 2020-06-09 ENCOUNTER — Telehealth: Payer: Self-pay

## 2020-06-09 NOTE — Chronic Care Management (AMB) (Signed)
    Chronic Care Management Pharmacy Assistant   Name: Carly [name]  MRN: [id number] DOB: 1945/11/30  Reason for Encounter: Medication Review    Recent office visits:  06/02/2020 OV PCP Dr. Beverely Low; start Buspirone twice daily in addition to Citalopram.  Recent consult visits:  n/a  Hospital visits:  None in previous 6 months  Medications: Outpatient Encounter Medications as of 06/09/2020  Medication Sig  . busPIRone (BUSPAR) 7.5 MG tablet Take 1 tablet (7.5 mg total) by mouth 2 (two) times daily.  . carbidopa-levodopa (SINEMET CR) 50-200 MG tablet Take 1 tablet by mouth at bedtime.  . carbidopa-levodopa (SINEMET IR) 25-100 MG tablet 2 at 6 AM/2 at 10 AM/1 at 2 PM/1 at 6 PM  . Cholecalciferol (VITAMIN D3) 100000 UNIT/GM POWD Take by mouth.  . citalopram (CELEXA) 10 MG tablet TAKE ONE TABLET BY MOUTH ONCE DAILY  . Cyanocobalamin POWD Take by mouth.  Marland Kitchen ketorolac (ACULAR) 0.5 % ophthalmic solution 1 drop.  Marland Kitchen latanoprost (XALATAN) 0.005 % ophthalmic solution Place 1 drop into both eyes at bedtime.   . meloxicam (MOBIC) 15 MG tablet Take 1 tablet (15 mg total) by mouth daily.  . pramipexole (MIRAPEX) 0.5 MG tablet Take 1 tablet (0.5 mg total) by mouth 3 (three) times daily. (Patient taking differently: Take 0.5 mg by mouth 3 (three) times daily. Breakfast, lunch, bedtime.)  . Vitamin D, Ergocalciferol, (DRISDOL) 1.25 MG (50000 UNIT) CAPS capsule Take 1 capsule (50,000 Units total) by mouth every 7 (seven) days. (Patient not taking: Reported on 06/02/2020)   No facility-administered encounter medications on file as of 06/09/2020.    Reviewed chart for medication changes ahead of medication coordination call.  BP Readings from Last 3 Encounters:  06/02/20 140/80  03/03/20 126/80  02/20/20 (!) 175/77    Lab Results  Component Value Date   HGBA1C 5.8 (H) 08/29/2013     Patient obtains medications through Vials  30 Days   Last adherence delivery included:  carbidopa/levodopa  25/100 2 tablets at 6 AM, 2 tablets at 10 AM, 1 tablet at 2 PM, 1 tablet at 6 PM, carbidopa/levodopa 50/200 at bedtime Vitamin B12 1,000 mcg daily  Patient is due for next adherence delivery on 06/14/2020 . Called patient and reviewed medications and coordinated delivery.  This delivery to include: carbidopa/levodopa 25/100 2 tablets at 6 AM, 2 tablets at 10 AM, 1 tablet at 2 PM, 1 tablet at 6 PM, carbidopa/levodopa 50/200 at bedtime Vitamin B12 1,000 mcg daily  Medications not needed: Buspirone - last filled 06/03/2020 30 DS Citalopram - last filled 06/01/2020 30 DS  Confirmed delivery date of 06/14/2020, advised patient that pharmacy will contact them the morning of delivery.  April D Calhoun, [facility] Clinical Pharmacist Assistant [phone]

## 2020-06-18 ENCOUNTER — Telehealth: Payer: Self-pay | Admitting: Family Medicine

## 2020-06-18 DIAGNOSIS — E669 Obesity, unspecified: Secondary | ICD-10-CM | POA: Diagnosis not present

## 2020-06-18 DIAGNOSIS — M1712 Unilateral primary osteoarthritis, left knee: Secondary | ICD-10-CM | POA: Diagnosis not present

## 2020-06-18 DIAGNOSIS — R69 Illness, unspecified: Secondary | ICD-10-CM | POA: Diagnosis not present

## 2020-06-18 DIAGNOSIS — E538 Deficiency of other specified B group vitamins: Secondary | ICD-10-CM | POA: Diagnosis not present

## 2020-06-18 DIAGNOSIS — M8580 Other specified disorders of bone density and structure, unspecified site: Secondary | ICD-10-CM | POA: Diagnosis not present

## 2020-06-18 DIAGNOSIS — Z6839 Body mass index (BMI) 39.0-39.9, adult: Secondary | ICD-10-CM | POA: Diagnosis not present

## 2020-06-18 DIAGNOSIS — Z9181 History of falling: Secondary | ICD-10-CM | POA: Diagnosis not present

## 2020-06-18 DIAGNOSIS — G20 Parkinson's disease: Secondary | ICD-10-CM | POA: Diagnosis not present

## 2020-06-18 NOTE — Telephone Encounter (Signed)
Home health called and stated that Carly [name] fell twice this week, but other than being sore she sustained no injuries.  Said that she was in her bedroom both times and just stumbled.

## 2020-06-21 NOTE — Telephone Encounter (Signed)
Please encourage her to use a cane or walker for stability and to hopefully prevent future falls

## 2020-06-21 NOTE — Telephone Encounter (Signed)
Called and spoke with patient and informed her per your order to use the cane or walker for stability. Patient stated that she is using one but she will use it at all times.

## 2020-06-21 NOTE — Telephone Encounter (Signed)
FYI: Patient stumbled in her bedroom twice sustaining no injuries only soreness.

## 2020-06-22 DIAGNOSIS — Z6839 Body mass index (BMI) 39.0-39.9, adult: Secondary | ICD-10-CM | POA: Diagnosis not present

## 2020-06-22 DIAGNOSIS — G20 Parkinson's disease: Secondary | ICD-10-CM | POA: Diagnosis not present

## 2020-06-22 DIAGNOSIS — E669 Obesity, unspecified: Secondary | ICD-10-CM | POA: Diagnosis not present

## 2020-06-22 DIAGNOSIS — M1712 Unilateral primary osteoarthritis, left knee: Secondary | ICD-10-CM | POA: Diagnosis not present

## 2020-06-22 DIAGNOSIS — Z9181 History of falling: Secondary | ICD-10-CM | POA: Diagnosis not present

## 2020-06-22 DIAGNOSIS — M8580 Other specified disorders of bone density and structure, unspecified site: Secondary | ICD-10-CM | POA: Diagnosis not present

## 2020-06-22 DIAGNOSIS — E538 Deficiency of other specified B group vitamins: Secondary | ICD-10-CM | POA: Diagnosis not present

## 2020-06-22 DIAGNOSIS — R69 Illness, unspecified: Secondary | ICD-10-CM | POA: Diagnosis not present

## 2020-06-29 ENCOUNTER — Other Ambulatory Visit: Payer: Self-pay | Admitting: Family Medicine

## 2020-06-29 DIAGNOSIS — M1712 Unilateral primary osteoarthritis, left knee: Secondary | ICD-10-CM | POA: Diagnosis not present

## 2020-06-29 DIAGNOSIS — G20 Parkinson's disease: Secondary | ICD-10-CM | POA: Diagnosis not present

## 2020-06-29 DIAGNOSIS — E538 Deficiency of other specified B group vitamins: Secondary | ICD-10-CM | POA: Diagnosis not present

## 2020-06-29 DIAGNOSIS — Z6839 Body mass index (BMI) 39.0-39.9, adult: Secondary | ICD-10-CM | POA: Diagnosis not present

## 2020-06-29 DIAGNOSIS — Z9181 History of falling: Secondary | ICD-10-CM | POA: Diagnosis not present

## 2020-06-29 DIAGNOSIS — E669 Obesity, unspecified: Secondary | ICD-10-CM | POA: Diagnosis not present

## 2020-06-29 DIAGNOSIS — R69 Illness, unspecified: Secondary | ICD-10-CM | POA: Diagnosis not present

## 2020-06-29 DIAGNOSIS — E559 Vitamin D deficiency, unspecified: Secondary | ICD-10-CM

## 2020-06-29 DIAGNOSIS — M8580 Other specified disorders of bone density and structure, unspecified site: Secondary | ICD-10-CM | POA: Diagnosis not present

## 2020-06-30 DIAGNOSIS — M8580 Other specified disorders of bone density and structure, unspecified site: Secondary | ICD-10-CM | POA: Diagnosis not present

## 2020-06-30 DIAGNOSIS — Z9181 History of falling: Secondary | ICD-10-CM | POA: Diagnosis not present

## 2020-06-30 DIAGNOSIS — R69 Illness, unspecified: Secondary | ICD-10-CM | POA: Diagnosis not present

## 2020-06-30 DIAGNOSIS — E538 Deficiency of other specified B group vitamins: Secondary | ICD-10-CM | POA: Diagnosis not present

## 2020-06-30 DIAGNOSIS — E669 Obesity, unspecified: Secondary | ICD-10-CM | POA: Diagnosis not present

## 2020-06-30 DIAGNOSIS — Z6839 Body mass index (BMI) 39.0-39.9, adult: Secondary | ICD-10-CM | POA: Diagnosis not present

## 2020-06-30 DIAGNOSIS — M1712 Unilateral primary osteoarthritis, left knee: Secondary | ICD-10-CM | POA: Diagnosis not present

## 2020-06-30 DIAGNOSIS — G20 Parkinson's disease: Secondary | ICD-10-CM | POA: Diagnosis not present

## 2020-07-06 ENCOUNTER — Telehealth: Payer: Self-pay | Admitting: Family Medicine

## 2020-07-06 DIAGNOSIS — Z6839 Body mass index (BMI) 39.0-39.9, adult: Secondary | ICD-10-CM | POA: Diagnosis not present

## 2020-07-06 DIAGNOSIS — M1712 Unilateral primary osteoarthritis, left knee: Secondary | ICD-10-CM | POA: Diagnosis not present

## 2020-07-06 DIAGNOSIS — M8580 Other specified disorders of bone density and structure, unspecified site: Secondary | ICD-10-CM | POA: Diagnosis not present

## 2020-07-06 DIAGNOSIS — Z9181 History of falling: Secondary | ICD-10-CM | POA: Diagnosis not present

## 2020-07-06 DIAGNOSIS — E669 Obesity, unspecified: Secondary | ICD-10-CM | POA: Diagnosis not present

## 2020-07-06 DIAGNOSIS — R69 Illness, unspecified: Secondary | ICD-10-CM | POA: Diagnosis not present

## 2020-07-06 DIAGNOSIS — G20 Parkinson's disease: Secondary | ICD-10-CM | POA: Diagnosis not present

## 2020-07-06 DIAGNOSIS — E538 Deficiency of other specified B group vitamins: Secondary | ICD-10-CM | POA: Diagnosis not present

## 2020-07-06 NOTE — Telephone Encounter (Signed)
..  Home Health Certification or Plan of Care Tracking  Is this a Certification or Plan of Care?  Yes  [facility] Agency:[facility]   Order Number:  [id number]  Has charge sheet been attached? yes Where has form been placed:  In Dr. Rennis Golden bin up front

## 2020-07-06 NOTE — Telephone Encounter (Signed)
Forms placed in bin to be filled 

## 2020-07-07 ENCOUNTER — Telehealth: Payer: Self-pay | Admitting: Family Medicine

## 2020-07-07 NOTE — Telephone Encounter (Signed)
Patient has questions about his moms medication.  Please call

## 2020-07-07 NOTE — Telephone Encounter (Signed)
We will discuss meds and any questions they may have during visit tomorrow

## 2020-07-07 NOTE — Telephone Encounter (Signed)
FYI: video visit scheduled in the morning at 830 for patient to discuss anxiety medication.

## 2020-07-08 ENCOUNTER — Encounter: Payer: Self-pay | Admitting: Family Medicine

## 2020-07-08 ENCOUNTER — Telehealth: Payer: Self-pay

## 2020-07-08 ENCOUNTER — Telehealth (INDEPENDENT_AMBULATORY_CARE_PROVIDER_SITE_OTHER): Payer: Medicare HMO | Admitting: Family Medicine

## 2020-07-08 DIAGNOSIS — F411 Generalized anxiety disorder: Secondary | ICD-10-CM | POA: Diagnosis not present

## 2020-07-08 DIAGNOSIS — R69 Illness, unspecified: Secondary | ICD-10-CM | POA: Diagnosis not present

## 2020-07-08 MED ORDER — CITALOPRAM HYDROBROMIDE 20 MG PO TABS: 20.0000 mg | ORAL_TABLET | Freq: Every day | ORAL | 3 refills | Status: DC

## 2020-07-08 NOTE — Telephone Encounter (Signed)
Picked up from the back, faxed to the # provided on the form and sent to scan  

## 2020-07-08 NOTE — Chronic Care Management (AMB) (Cosign Needed)
    Chronic Care Management Pharmacy Assistant   Name: Carly [name]  MRN: [id number] DOB: [birth year]   Reason for Encounter: Medication Review    Recent office visits:  07/08/2020 [facility] PCP [name], MD;  increase the Citalopram to 20mg  daily and leave the Buspar where it is for now.  She still has alprazolam to use as needed for rescue medication  Recent consult visits:  n/a  Hospital visits:  None in previous 6 months  Medications: Outpatient Encounter Medications as of 07/08/2020  Medication Sig   busPIRone (BUSPAR) 7.5 MG tablet Take 1 tablet (7.5 mg total) by mouth 2 (two) times daily.   carbidopa-levodopa (SINEMET CR) 50-200 MG tablet Take 1 tablet by mouth at bedtime.   carbidopa-levodopa (SINEMET IR) 25-100 MG tablet 2 at 6 AM/2 at 10 AM/1 at 2 PM/1 at 6 PM   Cholecalciferol (VITAMIN D3) 100000 UNIT/GM POWD Take by mouth.   citalopram (CELEXA) 20 MG tablet Take 1 tablet (20 mg total) by mouth daily.   Cyanocobalamin POWD Take by mouth.   ketorolac (ACULAR) 0.5 % ophthalmic solution 1 drop.   latanoprost (XALATAN) 0.005 % ophthalmic solution Place 1 drop into both eyes at bedtime.    meloxicam (MOBIC) 15 MG tablet Take 1 tablet (15 mg total) by mouth daily.   pramipexole (MIRAPEX) 0.5 MG tablet Take 1 tablet (0.5 mg total) by mouth 3 (three) times daily. (Patient taking differently: Take 0.5 mg by mouth 3 (three) times daily. Breakfast, lunch, bedtime.)   Vitamin D, Ergocalciferol, (DRISDOL) 1.25 MG (50000 UNIT) CAPS capsule Take 1 capsule (50,000 Units total) by mouth every 7 (seven) days. (Patient not taking: No sig reported)   No facility-administered encounter medications on file as of 07/08/2020.    Reviewed chart for medication changes ahead of medication coordination call.  No OVs, Consults, or hospital visits since last care coordination call/Pharmacist visit. (If appropriate, list visit date, provider name)  No medication changes indicated OR if recent  visit, treatment plan here.  BP Readings from Last 3 Encounters:  06/02/20 140/80  03/03/20 126/80  02/20/20 (!) 175/77    Lab Results  Component Value Date   HGBA1C 5.8 (H) 08/29/2013     Patient obtains medications through   Last adherence delivery included: (medication name and frequency)  Patient declined (meds) last month due to PRN use/additional supply on hand. Explanation of abundance on hand (ie #30 due to overlapping fills or previous adherence issues etc)  Patient is due for next adherence delivery on: 08/31/2013 Called patient and reviewed medications and coordinated delivery.  This delivery to include: Patient will need a short fill of (med), prior to adherence delivery. (To align with sync date or if PRN med)  Coordinated acute fill for (med) to be delivered (date).  Patient declined the following medications (meds) due to (reason)  Patient needs refills for .  Confirmed delivery date of , advised patient that pharmacy will contact them the morning of delivery.   *Unsuccessful attempt to reach patient for her medication coordination call. Chart review completed.  April D Calhoun, [facility] Clinical Pharmacist Assistant [phone]

## 2020-07-08 NOTE — Telephone Encounter (Signed)
Form completed and placed in basket  

## 2020-07-08 NOTE — Progress Notes (Signed)
Virtual Visit via Video   I connected with patient on 07/08/20 at  8:30 AM EDT by a video enabled telemedicine application and verified that I am speaking with the correct person using two identifiers.  Location patient: Home Location provider: [place], Office Persons participating in the virtual visit: Patient, Provider, CMA (Sabrina M)  I discussed the limitations of evaluation and management by telemedicine and the availability of in person appointments. The patient expressed understanding and agreed to proceed.  Subjective:   HPI:   Anxiety- pt remains anxious despite Buspar 7.5mg  twice daily.  Currently on Citalopram 10mg  daily.  She has Alprazolam to use as a rescue medication but family tries to avoid that.  She is having some hot flashes since starting the Buspar.  ROS:   See pertinent positives and negatives per HPI.  Patient Active Problem List   Diagnosis Date Noted  . Obesity (BMI 30-39.9) 03/03/2020  . 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 Color vision defect 05/21/2019  . Decreased visual acuity 05/21/2019  . Vitamin D deficiency 11/07/2017  . Primary osteoarthritis of left knee 05/21/2017  . Degenerative arthritis of left knee 05/18/2017  . Anterior cervical lymphadenopathy 05/14/2015  . Osteopenia 02/15/2015  . Physical exam 02/09/2014  . B12 deficiency 01/01/2014  . Achilles tendinitis 01/31/2010  . Parkinson's disease 07/08/2008  . Tremor, right hand 04/07/2008  . Anxiety state 01/31/2007  . HTN (hypertension) 01/31/2007    Social History   Tobacco Use  . Smoking status: Never Smoker  . Smokeless tobacco: Never Used  Substance Use Topics  . Alcohol use: No    Current Outpatient Medications:  .  busPIRone (BUSPAR) 7.5 MG tablet, Take 1 tablet (7.5 mg total) by mouth 2 (two) times daily., Disp: 60 tablet, Rfl: 3 .  carbidopa-levodopa (SINEMET CR) 50-200 MG tablet, Take 1 tablet by mouth at bedtime.,  Disp: 90 tablet, Rfl: 1 .  carbidopa-levodopa (SINEMET IR) 25-100 MG tablet, 2 at 6 AM/2 at 10 AM/1 at 2 PM/1 at 6 PM, Disp: 540 tablet, Rfl: 1 .  Cholecalciferol (VITAMIN D3) 100000 UNIT/GM POWD, Take by mouth., Disp: , Rfl:  .  citalopram (CELEXA) 10 MG tablet, TAKE ONE TABLET BY MOUTH ONCE DAILY, Disp: 30 tablet, Rfl: 5 .  Cyanocobalamin POWD, Take by mouth., Disp: , Rfl:  .  ketorolac (ACULAR) 0.5 % ophthalmic solution, 1 drop., Disp: , Rfl:  .  latanoprost (XALATAN) 0.005 % ophthalmic solution, Place 1 drop into both eyes at bedtime. , Disp: , Rfl:  .  meloxicam (MOBIC) 15 MG tablet, Take 1 tablet (15 mg total) by mouth daily., Disp: 30 tablet, Rfl: 0 .  pramipexole (MIRAPEX) 0.5 MG tablet, Take 1 tablet (0.5 mg total) by mouth 3 (three) times daily. (Patient taking differently: Take 0.5 mg by mouth 3 (three) times daily. Breakfast, lunch, bedtime.), Disp: 270 tablet, Rfl: 1 .  Vitamin D, Ergocalciferol, (DRISDOL) 1.25 MG (50000 UNIT) CAPS capsule, Take 1 capsule (50,000 Units total) by mouth every 7 (seven) days. (Patient not taking: No sig reported), Disp: 12 capsule, Rfl: 0  Allergies  Allergen Reactions  . Codeine Nausea Only and Other (See Comments)    hallucinations    Objective:   There were no vitals taken for this visit. AAOx3, NAD NCAT, EOMI No obvious CN deficits Coloring WNL Pt is able to speak clearly, coherently without shortness of breath or increased work of breathing.  Thought process is linear.  Mood is appropriate.  Assessment and Plan:   Anxiety- deteriorated.  Family is concerned anxiety is worsening as her condition deteriorates.  This is understandable but the goal is to get better control so she is less anxious and agitated.  Will increase the Citalopram to 20mg  daily and leave the Buspar where it is for now.  She still has alprazolam to use as needed for rescue medication.  We will follow closely and continue to adjust meds until pt is feeling better.  Will  send family a MyChart message to better explain medication.  , MD 07/08/2020

## 2020-07-08 NOTE — Progress Notes (Signed)
I connected with  Carly [name] on 07/08/20 by a video enabled telemedicine application and verified that I am speaking with the correct person using two identifiers.   I discussed the limitations of evaluation and management by telemedicine. The patient expressed understanding and agreed to proceed.

## 2020-07-12 DIAGNOSIS — R69 Illness, unspecified: Secondary | ICD-10-CM | POA: Diagnosis not present

## 2020-07-12 DIAGNOSIS — E538 Deficiency of other specified B group vitamins: Secondary | ICD-10-CM | POA: Diagnosis not present

## 2020-07-12 DIAGNOSIS — M1712 Unilateral primary osteoarthritis, left knee: Secondary | ICD-10-CM | POA: Diagnosis not present

## 2020-07-12 DIAGNOSIS — Z6839 Body mass index (BMI) 39.0-39.9, adult: Secondary | ICD-10-CM | POA: Diagnosis not present

## 2020-07-12 DIAGNOSIS — Z9181 History of falling: Secondary | ICD-10-CM | POA: Diagnosis not present

## 2020-07-12 DIAGNOSIS — G20 Parkinson's disease: Secondary | ICD-10-CM | POA: Diagnosis not present

## 2020-07-12 DIAGNOSIS — E669 Obesity, unspecified: Secondary | ICD-10-CM | POA: Diagnosis not present

## 2020-07-12 DIAGNOSIS — M8580 Other specified disorders of bone density and structure, unspecified site: Secondary | ICD-10-CM | POA: Diagnosis not present

## 2020-07-19 ENCOUNTER — Telehealth: Payer: Self-pay | Admitting: Family Medicine

## 2020-07-19 NOTE — Telephone Encounter (Signed)
LM asking pt to call back to schedule a 3wk reck Anxiety from appt on 07/08/20.

## 2020-07-20 ENCOUNTER — Other Ambulatory Visit: Payer: Self-pay

## 2020-07-20 MED ORDER — AMBULATORY NON FORMULARY MEDICATION: 0 refills | Status: DC

## 2020-07-23 ENCOUNTER — Telehealth: Payer: Self-pay | Admitting: Family Medicine

## 2020-07-23 DIAGNOSIS — E538 Deficiency of other specified B group vitamins: Secondary | ICD-10-CM | POA: Diagnosis not present

## 2020-07-23 DIAGNOSIS — M1712 Unilateral primary osteoarthritis, left knee: Secondary | ICD-10-CM | POA: Diagnosis not present

## 2020-07-23 DIAGNOSIS — E669 Obesity, unspecified: Secondary | ICD-10-CM | POA: Diagnosis not present

## 2020-07-23 DIAGNOSIS — G20 Parkinson's disease: Secondary | ICD-10-CM | POA: Diagnosis not present

## 2020-07-23 DIAGNOSIS — R69 Illness, unspecified: Secondary | ICD-10-CM | POA: Diagnosis not present

## 2020-07-23 DIAGNOSIS — Z6839 Body mass index (BMI) 39.0-39.9, adult: Secondary | ICD-10-CM | POA: Diagnosis not present

## 2020-07-23 DIAGNOSIS — M8580 Other specified disorders of bone density and structure, unspecified site: Secondary | ICD-10-CM | POA: Diagnosis not present

## 2020-07-23 DIAGNOSIS — Z9181 History of falling: Secondary | ICD-10-CM | POA: Diagnosis not present

## 2020-07-23 NOTE — Telephone Encounter (Signed)
Home Health - also needs verbal orders for Home health PT - 1 times a week for 9 weeks

## 2020-07-23 NOTE — Telephone Encounter (Signed)
Physical therapist called to report that Carly [name] fell at her home this week.  She did not hit her head and denies any injuries other than a few bruises on arm.  She is not in pain.

## 2020-07-26 NOTE — Telephone Encounter (Signed)
Spoke with Victorino Dike to give the verbal order. She voiced understanding and will be sending over orders for signature.

## 2020-07-26 NOTE — Telephone Encounter (Signed)
Ok for home health orders

## 2020-07-28 ENCOUNTER — Other Ambulatory Visit: Payer: Self-pay | Admitting: Family Medicine

## 2020-07-28 DIAGNOSIS — E559 Vitamin D deficiency, unspecified: Secondary | ICD-10-CM

## 2020-07-29 DIAGNOSIS — R69 Illness, unspecified: Secondary | ICD-10-CM | POA: Diagnosis not present

## 2020-07-29 DIAGNOSIS — M8580 Other specified disorders of bone density and structure, unspecified site: Secondary | ICD-10-CM | POA: Diagnosis not present

## 2020-07-29 DIAGNOSIS — Z6839 Body mass index (BMI) 39.0-39.9, adult: Secondary | ICD-10-CM | POA: Diagnosis not present

## 2020-07-29 DIAGNOSIS — Z9181 History of falling: Secondary | ICD-10-CM | POA: Diagnosis not present

## 2020-07-29 DIAGNOSIS — E669 Obesity, unspecified: Secondary | ICD-10-CM | POA: Diagnosis not present

## 2020-07-29 DIAGNOSIS — G20 Parkinson's disease: Secondary | ICD-10-CM | POA: Diagnosis not present

## 2020-07-29 DIAGNOSIS — E538 Deficiency of other specified B group vitamins: Secondary | ICD-10-CM | POA: Diagnosis not present

## 2020-07-29 DIAGNOSIS — M1712 Unilateral primary osteoarthritis, left knee: Secondary | ICD-10-CM | POA: Diagnosis not present

## 2020-08-04 ENCOUNTER — Telehealth: Payer: Self-pay

## 2020-08-04 NOTE — Chronic Care Management (AMB) (Signed)
    Chronic Care Management Pharmacy Assistant   Name: Carly [name]  MRN: [id number] DOB: 11-23-1945  Reason for Encounter: Medication Coordination Call  Recent office visits:  07/08/2020- [name], MD- seen for anxiety, increased citalopram from 10 mg to 20 mg daily  Recent consult visits:  No visits noted  Hospital visits:  None in previous 6 months  Medications: Outpatient Encounter Medications as of 08/04/2020  Medication Sig  . AMBULATORY NON FORMULARY MEDICATION Medication Name: Stairlift G20  . busPIRone (BUSPAR) 7.5 MG tablet Take 1 tablet (7.5 mg total) by mouth 2 (two) times daily.  . carbidopa-levodopa (SINEMET CR) 50-200 MG tablet Take 1 tablet by mouth at bedtime.  . carbidopa-levodopa (SINEMET IR) 25-100 MG tablet 2 at 6 AM/2 at 10 AM/1 at 2 PM/1 at 6 PM  . Cholecalciferol (VITAMIN D3) 100000 UNIT/GM POWD Take by mouth.  . citalopram (CELEXA) 20 MG tablet Take 1 tablet (20 mg total) by mouth daily.  . Cyanocobalamin POWD Take by mouth.  Marland Kitchen ketorolac (ACULAR) 0.5 % ophthalmic solution 1 drop.  Marland Kitchen latanoprost (XALATAN) 0.005 % ophthalmic solution Place 1 drop into both eyes at bedtime.   . meloxicam (MOBIC) 15 MG tablet Take 1 tablet (15 mg total) by mouth daily.  . pramipexole (MIRAPEX) 0.5 MG tablet Take 1 tablet (0.5 mg total) by mouth 3 (three) times daily. (Patient taking differently: Take 0.5 mg by mouth 3 (three) times daily. Breakfast, lunch, bedtime.)  . Vitamin D, Ergocalciferol, (DRISDOL) 1.25 MG (50000 UNIT) CAPS capsule Take 1 capsule (50,000 Units total) by mouth every 7 (seven) days. (Patient not taking: No sig reported)   No facility-administered encounter medications on file as of 08/04/2020.    Reviewed chart for medication changes ahead of medication coordination call.  No OVs, Consults, or hospital visits since last care coordination call/Pharmacist visit. (If appropriate, list visit date, provider name)  No medication changes indicated  OR if recent visit, treatment plan here.  BP Readings from Last 3 Encounters:  06/02/20 140/80  03/03/20 126/80  02/20/20 (!) 175/77    Lab Results  Component Value Date   HGBA1C 5.8 (H) 08/29/2013     Patient obtains medications through Vials  30 Days   Last adherence delivery included:  carbidopa/levodopa 25/100 2 tablets at 6 AM, 2 tablets at 10 AM, 1 tablet at 2 PM, 1 tablet at 6 PM, carbidopa/levodopa 50/200 at bedtime Vitamin B12 1,000 mcg daily  Patient is due for next adherence delivery on: 08/13/2020 Called patient and reviewed medications and coordinated delivery.  This delivery to include: Citalopram 20mg  1 Tablet at Breakfast vB12 1000mg   Carb/levo 25/100mg  2 tablets at 6 AM, 2 tablets at 10 AM, 1 tablet at 2 PM, 1 tablet at 6 PM, Carbidopa er 50mg  1 Tablet at Bedtime VD2 1 Tablet weekly Pramipexole 0.5mg   1 Tablet three times daily  Patient needs refills for  VD2 Pramipexole 0.5mg    Confirmed delivery date of 08/13/2020, advised patient that pharmacy will contact them the morning of delivery.  CPA, CMA

## 2020-08-05 DIAGNOSIS — Z9181 History of falling: Secondary | ICD-10-CM | POA: Diagnosis not present

## 2020-08-05 DIAGNOSIS — E669 Obesity, unspecified: Secondary | ICD-10-CM | POA: Diagnosis not present

## 2020-08-05 DIAGNOSIS — Z6839 Body mass index (BMI) 39.0-39.9, adult: Secondary | ICD-10-CM | POA: Diagnosis not present

## 2020-08-05 DIAGNOSIS — R69 Illness, unspecified: Secondary | ICD-10-CM | POA: Diagnosis not present

## 2020-08-05 DIAGNOSIS — G20 Parkinson's disease: Secondary | ICD-10-CM | POA: Diagnosis not present

## 2020-08-05 DIAGNOSIS — M8580 Other specified disorders of bone density and structure, unspecified site: Secondary | ICD-10-CM | POA: Diagnosis not present

## 2020-08-05 DIAGNOSIS — M1712 Unilateral primary osteoarthritis, left knee: Secondary | ICD-10-CM | POA: Diagnosis not present

## 2020-08-05 DIAGNOSIS — E538 Deficiency of other specified B group vitamins: Secondary | ICD-10-CM | POA: Diagnosis not present

## 2020-08-10 ENCOUNTER — Other Ambulatory Visit: Payer: Self-pay | Admitting: Neurology

## 2020-08-10 DIAGNOSIS — M1712 Unilateral primary osteoarthritis, left knee: Secondary | ICD-10-CM | POA: Diagnosis not present

## 2020-08-10 DIAGNOSIS — E669 Obesity, unspecified: Secondary | ICD-10-CM | POA: Diagnosis not present

## 2020-08-10 DIAGNOSIS — Z9181 History of falling: Secondary | ICD-10-CM | POA: Diagnosis not present

## 2020-08-10 DIAGNOSIS — M8580 Other specified disorders of bone density and structure, unspecified site: Secondary | ICD-10-CM | POA: Diagnosis not present

## 2020-08-10 DIAGNOSIS — G20 Parkinson's disease: Secondary | ICD-10-CM | POA: Diagnosis not present

## 2020-08-10 DIAGNOSIS — R69 Illness, unspecified: Secondary | ICD-10-CM | POA: Diagnosis not present

## 2020-08-10 DIAGNOSIS — Z6839 Body mass index (BMI) 39.0-39.9, adult: Secondary | ICD-10-CM | POA: Diagnosis not present

## 2020-08-10 DIAGNOSIS — E538 Deficiency of other specified B group vitamins: Secondary | ICD-10-CM | POA: Diagnosis not present

## 2020-08-19 ENCOUNTER — Telehealth: Payer: Self-pay

## 2020-08-19 DIAGNOSIS — Z9181 History of falling: Secondary | ICD-10-CM | POA: Diagnosis not present

## 2020-08-19 DIAGNOSIS — M8580 Other specified disorders of bone density and structure, unspecified site: Secondary | ICD-10-CM | POA: Diagnosis not present

## 2020-08-19 DIAGNOSIS — E669 Obesity, unspecified: Secondary | ICD-10-CM | POA: Diagnosis not present

## 2020-08-19 DIAGNOSIS — G20 Parkinson's disease: Secondary | ICD-10-CM | POA: Diagnosis not present

## 2020-08-19 DIAGNOSIS — R69 Illness, unspecified: Secondary | ICD-10-CM | POA: Diagnosis not present

## 2020-08-19 DIAGNOSIS — M1712 Unilateral primary osteoarthritis, left knee: Secondary | ICD-10-CM | POA: Diagnosis not present

## 2020-08-19 DIAGNOSIS — E538 Deficiency of other specified B group vitamins: Secondary | ICD-10-CM | POA: Diagnosis not present

## 2020-08-19 DIAGNOSIS — Z6839 Body mass index (BMI) 39.0-39.9, adult: Secondary | ICD-10-CM | POA: Diagnosis not present

## 2020-08-19 NOTE — Telephone Encounter (Signed)
Wanted to notify Dr. Beverely Low that patient fell off of the toilet two weeks ago.    States did not hit head or land on floor.  States she hit the side of the tub with left leg.  Did have bruise on left knee that is gone now.    Does not appear if patient sustained any other injuries.

## 2020-08-19 NOTE — Telephone Encounter (Signed)
noted 

## 2020-08-23 DIAGNOSIS — R69 Illness, unspecified: Secondary | ICD-10-CM | POA: Diagnosis not present

## 2020-08-23 DIAGNOSIS — G20 Parkinson's disease: Secondary | ICD-10-CM | POA: Diagnosis not present

## 2020-08-23 DIAGNOSIS — M1712 Unilateral primary osteoarthritis, left knee: Secondary | ICD-10-CM | POA: Diagnosis not present

## 2020-08-23 DIAGNOSIS — E538 Deficiency of other specified B group vitamins: Secondary | ICD-10-CM | POA: Diagnosis not present

## 2020-08-23 DIAGNOSIS — Z6839 Body mass index (BMI) 39.0-39.9, adult: Secondary | ICD-10-CM | POA: Diagnosis not present

## 2020-08-23 DIAGNOSIS — E669 Obesity, unspecified: Secondary | ICD-10-CM | POA: Diagnosis not present

## 2020-08-23 DIAGNOSIS — M8580 Other specified disorders of bone density and structure, unspecified site: Secondary | ICD-10-CM | POA: Diagnosis not present

## 2020-08-23 DIAGNOSIS — Z9181 History of falling: Secondary | ICD-10-CM | POA: Diagnosis not present

## 2020-09-02 DIAGNOSIS — M1712 Unilateral primary osteoarthritis, left knee: Secondary | ICD-10-CM | POA: Diagnosis not present

## 2020-09-02 DIAGNOSIS — Z6839 Body mass index (BMI) 39.0-39.9, adult: Secondary | ICD-10-CM | POA: Diagnosis not present

## 2020-09-02 DIAGNOSIS — M8580 Other specified disorders of bone density and structure, unspecified site: Secondary | ICD-10-CM | POA: Diagnosis not present

## 2020-09-02 DIAGNOSIS — Z9181 History of falling: Secondary | ICD-10-CM | POA: Diagnosis not present

## 2020-09-02 DIAGNOSIS — E538 Deficiency of other specified B group vitamins: Secondary | ICD-10-CM | POA: Diagnosis not present

## 2020-09-02 DIAGNOSIS — R69 Illness, unspecified: Secondary | ICD-10-CM | POA: Diagnosis not present

## 2020-09-02 DIAGNOSIS — G20 Parkinson's disease: Secondary | ICD-10-CM | POA: Diagnosis not present

## 2020-09-02 DIAGNOSIS — E669 Obesity, unspecified: Secondary | ICD-10-CM | POA: Diagnosis not present

## 2020-09-03 ENCOUNTER — Telehealth: Payer: Self-pay

## 2020-09-03 NOTE — Chronic Care Management (AMB) (Signed)
    Chronic Care Management Pharmacy Assistant   Name: Carly [name]  MRN: [id number] DOB: [birth year]  Reason for Encounter: Medication Coordination Call  Recent office visits:  No visits noted  Recent consult visits:  No visits noted  Hospital visits:  None in previous 6 months  Medications: Outpatient Encounter Medications as of 09/03/2020  Medication Sig  . AMBULATORY NON FORMULARY MEDICATION Medication Name: Stairlift G20  . busPIRone (BUSPAR) 7.5 MG tablet Take 1 tablet (7.5 mg total) by mouth 2 (two) times daily.  . carbidopa-levodopa (SINEMET CR) 50-200 MG tablet Take 1 tablet by mouth at bedtime.  . carbidopa-levodopa (SINEMET IR) 25-100 MG tablet 2 at 6 AM/2 at 10 AM/1 at 2 PM/1 at 6 PM  . Cholecalciferol (VITAMIN D3) 100000 UNIT/GM POWD Take by mouth.  . citalopram (CELEXA) 20 MG tablet Take 1 tablet (20 mg total) by mouth daily.  . Cyanocobalamin POWD Take by mouth.  Marland Kitchen ketorolac (ACULAR) 0.5 % ophthalmic solution 1 drop.  Marland Kitchen latanoprost (XALATAN) 0.005 % ophthalmic solution Place 1 drop into both eyes at bedtime.   . meloxicam (MOBIC) 15 MG tablet Take 1 tablet (15 mg total) by mouth daily.  . pramipexole (MIRAPEX) 0.5 MG tablet Take 1 tablet (0.5 mg total) by mouth 3 (three) times daily. (Patient taking differently: Take 0.5 mg by mouth 3 (three) times daily. Breakfast, lunch, bedtime.)  . Vitamin D, Ergocalciferol, (DRISDOL) 1.25 MG (50000 UNIT) CAPS capsule Take 1 capsule (50,000 Units total) by mouth every 7 (seven) days. (Patient not taking: No sig reported)   No facility-administered encounter medications on file as of 09/03/2020.    Reviewed chart for medication changes ahead of medication coordination call.  No OVs, Consults, or hospital visits since last care coordination call/Pharmacist visit. (If appropriate, list visit date, provider name)  No medication changes indicated OR if recent visit, treatment plan here.  BP Readings from Last 3 Encounters:   06/02/20 140/80  03/03/20 126/80  02/20/20 (!) 175/77    Lab Results  Component Value Date   HGBA1C 5.8 (H) 08/29/2013     Patient obtains medications through Vials  30 Days   Last adherence delivery included:  Citalopram 20mg  1 Tablet at Breakfast vB12 1000mg   Carb/levo 25/100mg  2 tablets at 6 AM, 2 tablets at 10 AM, 1 tablet at 2 PM, 1 tablet at 6 PM, Carbidopa er 50mg  1 Tablet at Bedtime VD2 1 Tablet weekly Pramipexole 0.5mg   1 Tablet three times daily  Patient is due for next adherence delivery on: 09/14/20 Called patient and reviewed medications and coordinated delivery.  This delivery to include: Citalopram 20mg  1 Tablet at Breakfast vB12 1000mg   Carb/levo 25/100mg  2 tablets at 6 AM, 2 tablets at 10 AM, 1 tablet at 2 PM, 1 tablet at 6 PM, Carbidopa er 50mg  1 Tablet at Bedtime VD3  Buspirone 7.5 mg    Patient needs refills for:  Carb/levo 25/100mg  Carbidopa er 50mg  Buspirone 7.5 mg   Confirmed delivery date of 09/14/20, advised patient that pharmacy will contact them the morning of delivery.  CPA, CMA

## 2020-09-05 ENCOUNTER — Other Ambulatory Visit: Payer: Self-pay | Admitting: Family Medicine

## 2020-09-06 DIAGNOSIS — G20 Parkinson's disease: Secondary | ICD-10-CM | POA: Diagnosis not present

## 2020-09-06 DIAGNOSIS — Z6839 Body mass index (BMI) 39.0-39.9, adult: Secondary | ICD-10-CM | POA: Diagnosis not present

## 2020-09-06 DIAGNOSIS — M8580 Other specified disorders of bone density and structure, unspecified site: Secondary | ICD-10-CM | POA: Diagnosis not present

## 2020-09-06 DIAGNOSIS — Z9181 History of falling: Secondary | ICD-10-CM | POA: Diagnosis not present

## 2020-09-06 DIAGNOSIS — E538 Deficiency of other specified B group vitamins: Secondary | ICD-10-CM | POA: Diagnosis not present

## 2020-09-06 DIAGNOSIS — E669 Obesity, unspecified: Secondary | ICD-10-CM | POA: Diagnosis not present

## 2020-09-06 DIAGNOSIS — M1712 Unilateral primary osteoarthritis, left knee: Secondary | ICD-10-CM | POA: Diagnosis not present

## 2020-09-06 DIAGNOSIS — R69 Illness, unspecified: Secondary | ICD-10-CM | POA: Diagnosis not present

## 2020-09-08 MED ORDER — BUSPIRONE HCL 7.5 MG PO TABS: 7.5000 mg | ORAL_TABLET | Freq: Two times a day (BID) | ORAL | 3 refills | Status: DC

## 2020-09-08 NOTE — Addendum Note (Signed)
Addended by: Dahlia Byes on: 09/08/2020 09:36 AM   Modules accepted: Orders

## 2020-09-09 ENCOUNTER — Ambulatory Visit: Payer: Medicare HMO | Admitting: Neurology

## 2020-09-13 DIAGNOSIS — R69 Illness, unspecified: Secondary | ICD-10-CM | POA: Diagnosis not present

## 2020-09-13 DIAGNOSIS — M8580 Other specified disorders of bone density and structure, unspecified site: Secondary | ICD-10-CM | POA: Diagnosis not present

## 2020-09-13 DIAGNOSIS — G20 Parkinson's disease: Secondary | ICD-10-CM | POA: Diagnosis not present

## 2020-09-13 DIAGNOSIS — E669 Obesity, unspecified: Secondary | ICD-10-CM | POA: Diagnosis not present

## 2020-09-13 DIAGNOSIS — Z6839 Body mass index (BMI) 39.0-39.9, adult: Secondary | ICD-10-CM | POA: Diagnosis not present

## 2020-09-13 DIAGNOSIS — M1712 Unilateral primary osteoarthritis, left knee: Secondary | ICD-10-CM | POA: Diagnosis not present

## 2020-09-13 DIAGNOSIS — E538 Deficiency of other specified B group vitamins: Secondary | ICD-10-CM | POA: Diagnosis not present

## 2020-09-13 DIAGNOSIS — Z9181 History of falling: Secondary | ICD-10-CM | POA: Diagnosis not present

## 2020-09-14 ENCOUNTER — Encounter: Payer: Self-pay | Admitting: Family Medicine

## 2020-09-14 ENCOUNTER — Other Ambulatory Visit: Payer: Self-pay

## 2020-09-14 ENCOUNTER — Ambulatory Visit (INDEPENDENT_AMBULATORY_CARE_PROVIDER_SITE_OTHER): Payer: Medicare HMO | Admitting: Family Medicine

## 2020-09-14 VITALS — BP 122/80 | HR 54 | Temp 97.5°F | Resp 19 | Ht 64.0 in | Wt 166.8 lb

## 2020-09-14 DIAGNOSIS — R350 Frequency of micturition: Secondary | ICD-10-CM | POA: Diagnosis not present

## 2020-09-14 DIAGNOSIS — R69 Illness, unspecified: Secondary | ICD-10-CM | POA: Diagnosis not present

## 2020-09-14 DIAGNOSIS — F411 Generalized anxiety disorder: Secondary | ICD-10-CM | POA: Diagnosis not present

## 2020-09-14 LAB — POCT URINALYSIS DIPSTICK
Bilirubin, UA: NEGATIVE
Blood, UA: NEGATIVE
Glucose, UA: NEGATIVE
Nitrite, UA: POSITIVE
Protein, UA: POSITIVE — AB
Spec Grav, UA: 1.02 (ref 1.010–1.025)
Urobilinogen, UA: 0.2 E.U./dL
pH, UA: 6 (ref 5.0–8.0)

## 2020-09-14 MED ORDER — CITALOPRAM HYDROBROMIDE 40 MG PO TABS: 40.0000 mg | ORAL_TABLET | Freq: Every day | ORAL | 3 refills | Status: DC

## 2020-09-14 MED ORDER — ALPRAZOLAM 0.5 MG PO TABS: 0.5000 mg | ORAL_TABLET | Freq: Two times a day (BID) | ORAL | 1 refills | Status: DC | PRN

## 2020-09-14 MED ORDER — CEPHALEXIN 500 MG PO CAPS: 500.0000 mg | ORAL_CAPSULE | Freq: Two times a day (BID) | ORAL | 0 refills | Status: AC

## 2020-09-14 NOTE — Addendum Note (Signed)
Addended by: Ilda Foil on: 09/14/2020 02:19 PM   Modules accepted: Orders

## 2020-09-14 NOTE — Assessment & Plan Note (Signed)
Deteriorated.  Caregiver reports she is continuing to have panic attacks on a regular basis.  Will increase daily SSRI but will also add Alprazolam to treat panic attacks.  Reviewed w/ pt, sister, and caregiver that this can be sedating and it would be best to start w/ 1/2 tab and only increase to full tab if needed.  Talked about increased risk of falls.  Will defer pt's nightmares to Dr Don Perking visit tomorrow.  Pt expressed understanding and is in agreement w/ plan.

## 2020-09-14 NOTE — Progress Notes (Signed)
   Subjective:    Patient ID: Carly [name], female    DOB: 1945-07-19, 75 y.o.   MRN: [id number]  HPI ? UTI- pt is here today w/ her home health aide.  They report that her urine has had a strong odor, it has been cloudy, increased frequency, and sometimes inability to go.  Denies itching, burning.  Sxs started ~2.5 weeks ago.  No fevers.  Denies suprapubic or CVA tenderness.  Anxiety- pt is averaging ~1 panic attack daily 'that really wipes her out'.  Aide feels this is really detrimental to her quality of life.  Currently on Buspar 7.5mg  BID and Citalopram 20mg  daily.     Review of Systems For ROS see HPI   This visit occurred during the SARS-CoV-2 public health emergency.  Safety protocols were in place, including screening questions prior to the visit, additional usage of staff PPE, and extensive cleaning of exam room while observing appropriate contact time as indicated for disinfecting solutions.      Objective:   Physical Exam Vitals reviewed.  Constitutional:      General: She is not in acute distress.    Appearance: Normal appearance. She is well-developed.  Abdominal:     General: There is no distension.     Palpations: Abdomen is soft.     Tenderness: There is no abdominal tenderness (no suprapubic or CVA tenderness).  Neurological:     Mental Status: She is alert. Mental status is at baseline.  Psychiatric:        Mood and Affect: Mood normal.        Behavior: Behavior normal.        Thought Content: Thought content normal.          Assessment & Plan:  Urine frequency- new.  Pt's reported sxs and UA suspicious for infxn.  Start Keflex and await culture results.

## 2020-09-14 NOTE — Progress Notes (Signed)
slm 

## 2020-09-14 NOTE — Progress Notes (Signed)
Assessment/Plan:   1.  Parkinsons Disease  -Continue carbidopa/levodopa 25/100, 2 tablets at 6 AM/2 tablets at 10 AM/1 tablet at 2 PM/1 tablet at 6 PM  -Continue carbidopa/levodopa 50/200 at bedtime  -proud of patient.  Using U step all the time and has full time caregiving  -Disease complicated by PD dyskinesia, but no medication required for this, and I think it would just cause further confusion.  2.  Memory change, likely PDD  -Had neurocognitive testing in June, 2021 that just demonstrated MCI.  Probable transition to PDD now given family reports.  Has been weaned off of pramipexole.  -doing much better with caregivers/family involvement  3.  B12 deficiency  -On oral supplementation.  4.  GAD  -Following with primary care.  On citalopram and BuSpar.   Subjective:   Carly [name] was seen today in follow up for Parkinsons disease.  My previous records were reviewed prior to todays visit as well as outside records available to me.  Pt with sister who supplements hx.  Pramipexole was stopped last visit because of memory change.  Physical therapy was sent to the home.  Discussed the importance of using a walker at all times and also discussed the importance of 24 hour/day care, including direct medication monitoring.  They report today that they mostly have 24 hour per day care except on the weekends and no one paid formally comes on sat/sun.  Caregiver is staying some overnight.  Sister is there some on the weekends and her significant other also stays some on the weekend.  She is never left alone (paid caregivers during week and unpaid friends/family on weekend).  pt denies falls and she is now using on the U-step.  Pt denies lightheadedness, near syncope.  No hallucinations.  Records from primary care are reviewed.  She had a video visit in April and citalopram was increased to 20 mg daily in addition to BuSpar.  Has a UTI currrently - noted it b/c of confusion and odor.  Sister  states that prior to UTI, mentation was great in the AM and then would decrease in the afternoon.    Current prescribed movement disorder medications: Carbidopa/levodopa 25/100, 2 tablets at 6 AM/2 tablets at 10 AM/1 tablet at 2 PM/1 tablet at 6 PM (increased last visit) Carbidopa/levodopa 50/200 at bedtime B12 supplementation (started last visit) (Citalopram, 20 mg daily and BuSpar 7.5 mg daily -both by primary care)   PREVIOUS MEDICATIONS: Sinemet, Sinemet CR, and Mirapex (stopped because of memory change)  ALLERGIES:   Allergies  Allergen Reactions   Codeine Nausea Only and Other (See Comments)    hallucinations    CURRENT MEDICATIONS:  Outpatient Encounter Medications as of 09/15/2020  Medication Sig   ALPRAZolam (XANAX) 0.5 MG tablet Take 1 tablet (0.5 mg total) by mouth 2 (two) times daily as needed for anxiety.   AMBULATORY NON FORMULARY MEDICATION Medication Name: Stairlift G20   busPIRone (BUSPAR) 7.5 MG tablet Take 1 tablet (7.5 mg total) by mouth 2 (two) times daily.   carbidopa-levodopa (SINEMET CR) 50-200 MG tablet Take 1 tablet by mouth at bedtime.   carbidopa-levodopa (SINEMET IR) 25-100 MG tablet 2 at 6 AM/2 at 10 AM/1 at 2 PM/1 at 6 PM   cephALEXin (KEFLEX) 500 MG capsule Take 1 capsule (500 mg total) by mouth 2 (two) times daily for 10 doses.   Cholecalciferol (VITAMIN D3) 100000 UNIT/GM POWD Take by mouth.   citalopram (CELEXA) 40 MG tablet Take 1 tablet (  40 mg total) by mouth daily.   Cyanocobalamin POWD Take by mouth.   ketorolac (ACULAR) 0.5 % ophthalmic solution 1 drop.   latanoprost (XALATAN) 0.005 % ophthalmic solution Place 1 drop into both eyes at bedtime.    meloxicam (MOBIC) 15 MG tablet Take 1 tablet (15 mg total) by mouth daily.   pramipexole (MIRAPEX) 0.5 MG tablet Take 1 tablet (0.5 mg total) by mouth 3 (three) times daily. (Patient taking differently: Take 0.5 mg by mouth 3 (three) times daily. Breakfast, lunch, bedtime.)   Vitamin D, Ergocalciferol,  (DRISDOL) 1.25 MG (50000 UNIT) CAPS capsule Take 1 capsule (50,000 Units total) by mouth every 7 (seven) days.   [DISCONTINUED] citalopram (CELEXA) 20 MG tablet Take 1 tablet (20 mg total) by mouth daily.   No facility-administered encounter medications on file as of 09/15/2020.    Objective:   PHYSICAL EXAMINATION:    VITALS:   Vitals:   09/15/20 1034  BP: 118/78  Pulse: (!) 55  SpO2: 99%  Weight: 167 lb (75.8 kg)  Height: 5\' 5"  (1.651 m)   Wt Readings from Last 3 Encounters:  09/15/20 167 lb (75.8 kg)  09/14/20 166 lb 12.8 oz (75.7 kg)  06/02/20 180 lb 12.8 oz (82 kg)     GEN:  The patient appears stated age and is in NAD. HEENT:  Normocephalic, atraumatic.  The mucous membranes are moist. The superficial temporal arteries are without ropiness or tenderness. CV:  brady.  regular Lungs:  CTAB Neck/HEME:  There are no carotid bruits bilaterally.  Neurological examination:  Orientation: The patient is alert and oriented x3. Cranial nerves: There is good facial symmetry with mild facial hypomimia. The speech is fluent and clear. Soft palate rises symmetrically and there is no tongue deviation. Hearing is intact to conversational tone. Sensation: Sensation is intact to light touch throughout Motor: Strength is at least antigravity x4.  Movement examination: Tone: There is nl tone in the UE/LE Abnormal movements: very mild axial and LE dyskinesia Coordination:  There is mild decremation with RAM's, with any form of RAMS, including alternating supination and pronation of the forearm, hand opening and closing, finger taps, heel taps and toe taps bilaterally Gait and Station: The patient has mild difficulty arising out of a deep-seated chair without the use of the hands. The patient's stride length is good with dragging of the leg on the L. She is stable with the U step  I have reviewed and interpreted the following labs independently    Chemistry      Component Value  Date/Time   NA 137 03/03/2020 1035   K 4.8 03/03/2020 1035   CL 102 03/03/2020 1035   CO2 30 03/03/2020 1035   BUN 25 (H) 03/03/2020 1035   CREATININE 1.24 (H) 03/03/2020 1035      Component Value Date/Time   CALCIUM 9.2 03/03/2020 1035   ALKPHOS 94 03/03/2020 1035   AST 16 03/03/2020 1035   ALT 5 03/03/2020 1035   BILITOT 0.8 03/03/2020 1035       Lab Results  Component Value Date   WBC 8.5 03/03/2020   HGB 12.9 03/03/2020   HCT 39.4 03/03/2020   MCV 91.2 03/03/2020   PLT 146.0 (L) 03/03/2020    Lab Results  Component Value Date   TSH 0.96 03/03/2020     Total time spent on today's visit was 30 minutes, including both face-to-face time and nonface-to-face time.  Time included that spent on review of records (prior notes available  to me/labs/imaging if pertinent), discussing treatment and goals, answering patient's questions and coordinating care.  Cc:  Sheliah Hatch, MD

## 2020-09-14 NOTE — Patient Instructions (Signed)
Follow up in 4-6 weeks to recheck anxiety INCREASE the Citalopram to 40mg  daily- 2 of what you currently have and 1 of the new prescription CONTINUE the Buspirone twice daily USE the Alprazolam as needed for panicked moments- you can start with half tab and increase to a full tab as needed TAKE the Cephalexin twice daily for presumed UTI Call with any questions or concerns Hang in there!!!

## 2020-09-15 ENCOUNTER — Encounter: Payer: Self-pay | Admitting: Neurology

## 2020-09-15 ENCOUNTER — Ambulatory Visit: Payer: Medicare HMO | Admitting: Neurology

## 2020-09-15 VITALS — BP 118/78 | HR 55 | Ht 65.0 in | Wt 167.0 lb

## 2020-09-15 DIAGNOSIS — G20 Parkinson's disease: Secondary | ICD-10-CM

## 2020-09-15 DIAGNOSIS — G249 Dystonia, unspecified: Secondary | ICD-10-CM | POA: Diagnosis not present

## 2020-09-16 LAB — URINE CULTURE
MICRO NUMBER:: 12006214
SPECIMEN QUALITY:: ADEQUATE

## 2020-09-20 ENCOUNTER — Telehealth: Payer: Self-pay | Admitting: Family Medicine

## 2020-09-20 NOTE — Telephone Encounter (Signed)
Patient sister called and stated that her frequency of urine has got a lot better but still has a smell and is cloudy. Please advise.

## 2020-09-20 NOTE — Telephone Encounter (Signed)
She would need to provide a repeat urine for [place] to culture

## 2020-09-20 NOTE — Telephone Encounter (Signed)
Patient's sister called and said that the frequency has gotten better but that urine is still cloudy and has a smell.  Please advise.

## 2020-09-21 ENCOUNTER — Telehealth: Payer: Self-pay

## 2020-09-21 ENCOUNTER — Other Ambulatory Visit: Payer: Self-pay | Admitting: Family Medicine

## 2020-09-21 ENCOUNTER — Telehealth: Payer: Self-pay | Admitting: Family Medicine

## 2020-09-21 DIAGNOSIS — Z6839 Body mass index (BMI) 39.0-39.9, adult: Secondary | ICD-10-CM | POA: Diagnosis not present

## 2020-09-21 DIAGNOSIS — R350 Frequency of micturition: Secondary | ICD-10-CM | POA: Diagnosis not present

## 2020-09-21 DIAGNOSIS — R69 Illness, unspecified: Secondary | ICD-10-CM | POA: Diagnosis not present

## 2020-09-21 DIAGNOSIS — E538 Deficiency of other specified B group vitamins: Secondary | ICD-10-CM | POA: Diagnosis not present

## 2020-09-21 DIAGNOSIS — G20 Parkinson's disease: Secondary | ICD-10-CM | POA: Diagnosis not present

## 2020-09-21 DIAGNOSIS — Z9181 History of falling: Secondary | ICD-10-CM | POA: Diagnosis not present

## 2020-09-21 DIAGNOSIS — M8580 Other specified disorders of bone density and structure, unspecified site: Secondary | ICD-10-CM | POA: Diagnosis not present

## 2020-09-21 DIAGNOSIS — M1712 Unilateral primary osteoarthritis, left knee: Secondary | ICD-10-CM | POA: Diagnosis not present

## 2020-09-21 DIAGNOSIS — E669 Obesity, unspecified: Secondary | ICD-10-CM | POA: Diagnosis not present

## 2020-09-21 NOTE — Telephone Encounter (Signed)
Anderson Malta called in stating that pt is being discharged from [facility] physical therapy. Pt has met her max potential.   Anderson Malta can be reached at [phone]

## 2020-09-21 NOTE — Progress Notes (Signed)
Orders entered for pt's urine

## 2020-09-21 NOTE — Telephone Encounter (Signed)
error 

## 2020-09-21 NOTE — Telephone Encounter (Signed)
Spoke with patient and she stated that she would get a sample up here sometime this week or today if she can.

## 2020-09-23 ENCOUNTER — Telehealth: Payer: Self-pay | Admitting: Family Medicine

## 2020-09-23 NOTE — Telephone Encounter (Signed)
Nurse is aware, just wanted to inform you of reoccurring symptoms.

## 2020-09-23 NOTE — Telephone Encounter (Signed)
Carly [name] called in asking for the results for the UA and urine culture.   Please call [phone]

## 2020-09-23 NOTE — Telephone Encounter (Signed)
Please let pt/family/caregiver know that we are waiting for the culture results and the lab says they should be back either late today for first thing tomorrow.  We are waiting on the culture since she was just treated for a UTI.  We want to make sure we are treating appropriately

## 2020-09-24 ENCOUNTER — Encounter: Payer: Self-pay | Admitting: Family Medicine

## 2020-09-24 ENCOUNTER — Ambulatory Visit (INDEPENDENT_AMBULATORY_CARE_PROVIDER_SITE_OTHER): Payer: Medicare HMO | Admitting: Family Medicine

## 2020-09-24 ENCOUNTER — Other Ambulatory Visit: Payer: Self-pay

## 2020-09-24 ENCOUNTER — Telehealth (HOSPITAL_COMMUNITY): Payer: Self-pay

## 2020-09-24 VITALS — BP 122/75 | HR 53 | Temp 97.5°F | Resp 17 | Ht 65.0 in | Wt 167.0 lb

## 2020-09-24 DIAGNOSIS — R1319 Other dysphagia: Secondary | ICD-10-CM | POA: Diagnosis not present

## 2020-09-24 LAB — URINE CULTURE
MICRO NUMBER:: 12034443
SPECIMEN QUALITY:: ADEQUATE

## 2020-09-24 MED ORDER — NITROFURANTOIN MONOHYD MACRO 100 MG PO CAPS: 100.0000 mg | ORAL_CAPSULE | Freq: Two times a day (BID) | ORAL | 0 refills | Status: AC

## 2020-09-24 NOTE — Patient Instructions (Signed)
Follow up as needed or as scheduled We'll call you to set up the speech pathology/swallow study appt Continue to take precautions when eating and drinking Call with any questions or concerns Hang in there!

## 2020-09-24 NOTE — Telephone Encounter (Signed)
Attempted to contact patient to schedule OP MBS - left voicemail. ?

## 2020-09-24 NOTE — Progress Notes (Signed)
   Subjective:    Patient ID: Carly [name], female    DOB: 10/03/1945, 75 y.o.   MRN: [id number]  HPI Dysphagia- pt doesn't feel that there is an issue but caregiver is concerned.  Caregiver reports 'we take a lot of precautions'.  Pt drinks from straws.  'thin liquids are hit or miss'.  Also occurring w/ food- hard foods, chewy foods.  She will have coughing spells during meals.  This has not been discussed w/ Dr Tat.     Review of Systems For ROS see HPI   This visit occurred during the SARS-CoV-2 public health emergency.  Safety protocols were in place, including screening questions prior to the visit, additional usage of staff PPE, and extensive cleaning of exam room while observing appropriate contact time as indicated for disinfecting solutions.      Objective:   Physical Exam Vitals reviewed.  Constitutional:      General: She is not in acute distress.    Appearance: Normal appearance.  HENT:     Head: Normocephalic and atraumatic.  Skin:    General: Skin is warm and dry.  Neurological:     Mental Status: She is alert and oriented to person, place, and time.     Gait: Gait abnormal (walks w/ rolling walker in a stooped position).     Comments: Soft voice  Psychiatric:     Comments: Flat affect, let's sister and caregiver speak for her          Assessment & Plan:   Dysphagia- new to provider.  Caregiver says this is ongoing, pt states it is not a problem.  + coughing while eating.  Caregiver states they are already taking precautions- using straws, trying to avoid hard or chewy foods.  They did not mention this to Neuro at their appt earlier this month.  Will refer to Speech Path for a swallow study and forward note to neuro.  Pt and family/caregiver expressed understanding.

## 2020-09-28 ENCOUNTER — Other Ambulatory Visit: Payer: Self-pay | Admitting: Family Medicine

## 2020-09-29 ENCOUNTER — Encounter: Payer: Self-pay | Admitting: *Deleted

## 2020-09-30 ENCOUNTER — Other Ambulatory Visit: Payer: Self-pay | Admitting: Family Medicine

## 2020-10-01 ENCOUNTER — Telehealth (HOSPITAL_COMMUNITY): Payer: Self-pay | Admitting: *Deleted

## 2020-10-01 ENCOUNTER — Telehealth: Payer: Self-pay

## 2020-10-01 NOTE — Telephone Encounter (Signed)
Left VM requesting a callback to schedule OP MBS. RKEEL

## 2020-10-01 NOTE — Chronic Care Management (AMB) (Signed)
    Chronic Care Management Pharmacy Assistant   Name: Carly [name]  MRN: [id number] DOB: 09/29/1945  Reason for Encounter: Medication Coordination Call   Recent office visits:  09/24/20 [name] MD- Patient was seen for Dysphagia, no medication changes, visit noted referral to speech pathologist for swallow study. Follow up prn 09/24/20 Orders Only-Dr. Beverely Low prescribed short course of Macrobid 100mg  x7 days due to UTI. 09/14/20 09/16/20 MD- pt was seen for urine frequency, started Alprazolam 0.5 mg BID PRN, short course Cephalexin 500mg  x5 days, increased Citalopram Hydrobromide from 20mg  daily to 40mg  daily. Follow up 4-6 weeks to recheck anxiety.  Recent consult visits:  09/15/20 Tat, DO(neurology)- pt was seen for Parkinsons Disease, no medication changes and no follow up documented.  Hospital visits:  None in previous 6 months  Medications: Outpatient Encounter Medications as of 10/01/2020  Medication Sig   ALPRAZolam (XANAX) 0.5 MG tablet Take 1 tablet (0.5 mg total) by mouth 2 (two) times daily as needed for anxiety.   AMBULATORY NON FORMULARY MEDICATION Medication Name: Stairlift G20   busPIRone (BUSPAR) 7.5 MG tablet Take 1 tablet (7.5 mg total) by mouth 2 (two) times daily.   carbidopa-levodopa (SINEMET CR) 50-200 MG tablet Take 1 tablet by mouth at bedtime.   carbidopa-levodopa (SINEMET IR) 25-100 MG tablet 2 at 6 AM/2 at 10 AM/1 at 2 PM/1 at 6 PM   Cholecalciferol (VITAMIN D3) 100000 UNIT/GM POWD Take by mouth.   citalopram (CELEXA) 40 MG tablet Take 1 tablet (40 mg total) by mouth daily.   Cyanocobalamin POWD Take by mouth.   ketorolac (ACULAR) 0.5 % ophthalmic solution 1 drop.   latanoprost (XALATAN) 0.005 % ophthalmic solution Place 1 drop into both eyes at bedtime.    meloxicam (MOBIC) 15 MG tablet Take 1 tablet (15 mg total) by mouth daily.   nitrofurantoin, macrocrystal-monohydrate, (MACROBID) 100 MG capsule Take 1 capsule (100 mg total)  by mouth 2 (two) times daily for 7 days.   Vitamin D, Ergocalciferol, (DRISDOL) 1.25 MG (50000 UNIT) CAPS capsule Take 1 capsule (50,000 Units total) by mouth every 7 (seven) days.   No facility-administered encounter medications on file as of 10/01/2020.   Reviewed chart for medication changes ahead of medication coordination call.  No OVs, Consults, or hospital visits since last care coordination call/Pharmacist visit. (If appropriate, list visit date, provider name)  No medication changes indicated OR if recent visit, treatment plan here.  BP Readings from Last 3 Encounters:  09/24/20 122/75  09/15/20 118/78  09/14/20 122/80    Lab Results  Component Value Date   HGBA1C 5.8 (H) 08/29/2013    Several unsuccessful attempts made to contact patient for medication coordination  Patient obtains medications through Vials  30 Days   Last adherence delivery included:  Citalopram 20mg  1 Tablet at Breakfast vB12 1000mg  Carb/levo 25/100mg  2 tablets at 6 AM, 2 tablets at 10 AM, 1 tablet at 2 PM, 1 tablet at 6 PM, Carbidopa er 50mg  1 Tablet at Bedtime VD3  Buspirone 7.5 mg     09/26/20 09/17/20, CMA

## 2020-10-08 ENCOUNTER — Telehealth (HOSPITAL_COMMUNITY): Payer: Self-pay

## 2020-10-08 NOTE — Telephone Encounter (Signed)
3rd attempt to contact patient to schedule OP MBS - left voicemail. If no return phone call, will close order.

## 2020-10-21 ENCOUNTER — Ambulatory Visit: Payer: Medicare HMO | Admitting: Family Medicine

## 2020-10-21 ENCOUNTER — Telehealth (INDEPENDENT_AMBULATORY_CARE_PROVIDER_SITE_OTHER): Payer: Medicare HMO | Admitting: Registered Nurse

## 2020-10-21 ENCOUNTER — Encounter: Payer: Self-pay | Admitting: Registered Nurse

## 2020-10-21 ENCOUNTER — Other Ambulatory Visit: Payer: Self-pay

## 2020-10-21 ENCOUNTER — Other Ambulatory Visit: Payer: Self-pay | Admitting: Registered Nurse

## 2020-10-21 VITALS — BP 130/85 | HR 60 | Resp 14

## 2020-10-21 DIAGNOSIS — F411 Generalized anxiety disorder: Secondary | ICD-10-CM

## 2020-10-21 DIAGNOSIS — W19XXXA Unspecified fall, initial encounter: Secondary | ICD-10-CM

## 2020-10-21 DIAGNOSIS — R1319 Other dysphagia: Secondary | ICD-10-CM | POA: Diagnosis not present

## 2020-10-21 DIAGNOSIS — G20 Parkinson's disease: Secondary | ICD-10-CM

## 2020-10-21 DIAGNOSIS — R69 Illness, unspecified: Secondary | ICD-10-CM | POA: Diagnosis not present

## 2020-10-21 MED ORDER — BUSPIRONE HCL 7.5 MG PO TABS: 7.5000 mg | ORAL_TABLET | Freq: Three times a day (TID) | ORAL | 1 refills | Status: DC

## 2020-10-21 MED ORDER — CITALOPRAM HYDROBROMIDE 20 MG PO TABS: 20.0000 mg | ORAL_TABLET | Freq: Every day | ORAL | 0 refills | Status: DC

## 2020-10-21 NOTE — Progress Notes (Signed)
Telemedicine Encounter- SOAP NOTE Established Patient  This telephone encounter was conducted with the patient's (or proxy's) verbal consent via audio telecommunications: yes/no: Yes Patient was instructed to have this encounter in a suitably private space; and to only have persons present to whom they give permission to participate. In addition, patient identity was confirmed by use of name plus two identifiers (DOB and address).  I discussed the limitations, risks, security and privacy concerns of performing an evaluation and management service by telephone and the availability of in person appointments. I also discussed with the patient that there may be a patient responsible charge related to this service. The patient expressed understanding and agreed to proceed.  I spent a total of 26 minutes talking with the patient or their proxy.  Patient at home Provider in office  Participants: [name], NP and Carly [name] and sister Carly [name] and caregiver.  Chief Complaint  Patient presents with   Follow-up    Patient states she is follow up on anxiety. Patient states that her medication for anxiety has been increased 40 mg  and she would like to decrease the medication due to having two falls and not helping. GAD7=15    Subjective   Carly [name] is a 75 y.o. established patient. Telephone visit today for follow up   HPI Increased citalopram to 40mg  around 6 weeks ago, anxiety worsening Has had two falls - one on July 16 at around 7am, one on July 18 at around 130pm  Pt describes it as knees buckling and falling. Did hit head on July 16, no bruising or swelling, no cognitive changes since.  Refused ER visit despite urging from family and caregiver. No ongoing pain or injuries, some bruising.   After increase of citalopram concern that this was related to her falls. Would like to decrease back to 20mg .  Still taking buspar 7.5mg  po bid Not taking alprazolam 0.5mg  at behest  of Dr. 12-08-1988 in neuro  Otherwise, notes she wants to continue PT at home with [facility]. Will need new orders placed soon.   Patient Active Problem List   Diagnosis Date Noted   Obesity (BMI 30-39.9) 03/03/2020   Mild neurocognitive disorder due to Parkinson's disease 09/30/2019   Color vision defect 05/21/2019   Decreased visual acuity 05/21/2019   Vitamin D deficiency 11/07/2017   Primary osteoarthritis of left knee 05/21/2017   Degenerative arthritis of left knee 05/18/2017   Anterior cervical lymphadenopathy 05/14/2015   Osteopenia 02/15/2015   Physical exam 02/09/2014   B12 deficiency 01/01/2014   Achilles tendinitis 01/31/2010   Parkinson's disease 07/08/2008   Tremor, right hand 04/07/2008   Anxiety state 01/31/2007   HTN (hypertension) 01/31/2007    Past Medical History:  Diagnosis Date   Anemia    Anterior cervical lymphadenopathy 05/14/2015   B12 deficiency 01/01/2014   Bronchitis 04/12/2011   Color vision defect 05/21/2019   Decreased visual acuity 05/21/2019   Degenerative arthritis of left knee 05/18/2017   DVT of lower extremity, bilateral    years ago    Generalized anxiety disorder with panic attacks 01/31/2007   HTN (hypertension) 01/31/2007   Hypertension    Mild neurocognitive disorder due to Parkinson's disease 09/30/2019   Osteopenia 02/15/2015   Parkinson's disease    Primary osteoarthritis of left knee 05/21/2017   Vitamin D deficiency     Current Outpatient Medications  Medication Sig Dispense Refill   ALPRAZolam (XANAX) 0.5 MG tablet Take 1 tablet (0.5 mg total)  by mouth 2 (two) times daily as needed for anxiety. 30 tablet 1   AMBULATORY NON FORMULARY MEDICATION Medication Name: Stairlift G20 1 Device 0   busPIRone (BUSPAR) 7.5 MG tablet Take 1 tablet (7.5 mg total) by mouth 2 (two) times daily. 60 tablet 3   carbidopa-levodopa (SINEMET CR) 50-200 MG tablet Take 1 tablet by mouth at bedtime. 90 tablet 1   carbidopa-levodopa (SINEMET IR) 25-100 MG tablet  2 at 6 AM/2 at 10 AM/1 at 2 PM/1 at 6 PM 540 tablet 1   Cholecalciferol (VITAMIN D3) 100000 UNIT/GM POWD Take by mouth.     citalopram (CELEXA) 40 MG tablet Take 1 tablet (40 mg total) by mouth daily. 30 tablet 3   Cyanocobalamin POWD Take by mouth.     ketorolac (ACULAR) 0.5 % ophthalmic solution 1 drop.     latanoprost (XALATAN) 0.005 % ophthalmic solution Place 1 drop into both eyes at bedtime.      meloxicam (MOBIC) 15 MG tablet Take 1 tablet (15 mg total) by mouth daily. 30 tablet 0   Vitamin D, Ergocalciferol, (DRISDOL) 1.25 MG (50000 UNIT) CAPS capsule Take 1 capsule (50,000 Units total) by mouth every 7 (seven) days. 12 capsule 0   No current facility-administered medications for this visit.    Allergies  Allergen Reactions   Codeine Nausea Only and Other (See Comments)    hallucinations    Social History   Socioeconomic History   Marital status: Significant Other    Spouse name: Not on file   Number of children: 1   Years of education: 12   Highest education level: High school graduate  Occupational History   Occupation: Retired  Tobacco Use   Smoking status: Never   Smokeless tobacco: Never  Vaping Use   Vaping Use: Never used  Substance and Sexual Activity   Alcohol use: No   Drug use: No   Sexual activity: Not on file  Other Topics Concern   Not on file  Social History Narrative   Patient lives at home with partner Carly [name].    Patient has one adult child.    Patient has 14 years of education.    Patient does not work.   Caffeine consumption is 1 cup daily    Social Determinants of Health   Financial Resource Strain: Low Risk    Difficulty of Paying Living Expenses: Not hard at all  Food Insecurity: No Food Insecurity   Worried About Programme researcher, broadcasting/film/video in the Last Year: Never true   Ran Out of Food in the Last Year: Never true  Transportation Needs: No Transportation Needs   Lack of Transportation (Medical): No   Lack of Transportation  (Non-Medical): No  Physical Activity: Inactive   Days of Exercise per Week: 0 days   Minutes of Exercise per Session: 0 min  Stress: No Stress Concern Present   Feeling of Stress : Only a little  Social Connections: Moderately Isolated   Frequency of Communication with Friends and Family: More than three times a week   Frequency of Social Gatherings with Friends and Family: More than three times a week   Attends Religious Services: Never   Database administrator or Organizations: No   Attends Banker Meetings: Never   Marital Status: Living with partner  Intimate Partner Violence: Not At Risk   Fear of Current or Ex-Partner: No   Emotionally Abused: No   Physically Abused: No   Sexually Abused: No  ROS Per hpi   Objective   Vitals as reported by the patient: Today's Vitals   10/21/20 1211  BP: 130/85  Pulse: 60  Resp: 14    Harumi was seen today for follow-up.  Diagnoses and all orders for this visit:  Other dysphagia -     DG SWALLOW STUDY OP MEDICARE SPEECH PATH; Future  Anxiety state -     citalopram (CELEXA) 20 MG tablet; Take 1 tablet (20 mg total) by mouth daily at 6 (six) AM. -     busPIRone (BUSPAR) 7.5 MG tablet; Take 1 tablet (7.5 mg total) by mouth 3 (three) times daily.  Fall, initial encounter  PLAN Unable to connect on video, but will contact PCP Dr. Beverely Low to see if she can place orders for PT and SLP based on OV on 09/24/20 CC message to Dr. Arbutus Leas - in particular concern for falls. Pt refused ED visits, urge her to seek in person care if another fall occurs. Will reduce citalopram to 20mg  daily at 6am. While I'm not certain that the increased dose was cause of falls, if she is not getting therapeutic benefit from higher dose, will resume 20mg  dosing. Explored options for further anxiety treatment including a nighttime dose of buspar 7.5mg  po qd or switching to another SSRI. Pt will consider options.  Return in 4-6 weeks for med check. Can  be virtual or in person Patient encouraged to call clinic with any questions, comments, or concerns.   I discussed the assessment and treatment plan with the patient. The patient was provided an opportunity to ask questions and all were answered. The patient agreed with the plan and demonstrated an understanding of the instructions.   The patient was advised to call back or seek an in-person evaluation if the symptoms worsen or if the condition fails to improve as anticipated.  I provided 26 minutes of non-face-to-face time during this encounter.  , NP

## 2020-10-21 NOTE — Patient Instructions (Signed)
° ° ° °  If you have lab work done today you will be contacted with your lab results within the next 2 weeks.  If you have not heard from us then please contact us. The fastest way to get your results is to register for My Chart. ° ° °IF you received an x-ray today, you will receive an invoice from [facility]. Please contact [facility] at [phone] with questions or concerns regarding your invoice.  ° °IF you received labwork today, you will receive an invoice from [facility]. Please contact [facility] at [phone] with questions or concerns regarding your invoice.  ° °Our billing staff will not be able to assist you with questions regarding bills from these companies. ° °You will be contacted with the lab results as soon as they are available. The fastest way to get your results is to activate your My Chart account. Instructions are located on the last page of this paperwork. If you have not heard from us regarding the results in 2 weeks, please contact this office. °  ° ° ° °

## 2020-10-29 ENCOUNTER — Telehealth: Payer: Self-pay

## 2020-10-29 NOTE — Chronic Care Management (AMB) (Signed)
    Chronic Care Management Pharmacy Assistant   Name: Carly [name]  MRN: [id number] DOB: 12-Aug-1945  Reason for Encounter: Chart Review    Recent office visits:  10/21/2020 VV, follow up anxiety, resume current medications  Recent consult visits:  None  Hospital visits:  None in previous 6 months  Medications: Outpatient Encounter Medications as of 10/29/2020  Medication Sig   AMBULATORY NON FORMULARY MEDICATION Medication Name: Stairlift G20   busPIRone (BUSPAR) 7.5 MG tablet Take 1 tablet (7.5 mg total) by mouth 3 (three) times daily.   carbidopa-levodopa (SINEMET CR) 50-200 MG tablet Take 1 tablet by mouth at bedtime.   carbidopa-levodopa (SINEMET IR) 25-100 MG tablet 2 at 6 AM/2 at 10 AM/1 at 2 PM/1 at 6 PM   Cholecalciferol (VITAMIN D3) 100000 UNIT/GM POWD Take by mouth.   citalopram (CELEXA) 20 MG tablet Take 1 tablet (20 mg total) by mouth daily at 6 (six) AM.   Cyanocobalamin POWD Take by mouth.   ketorolac (ACULAR) 0.5 % ophthalmic solution 1 drop.   latanoprost (XALATAN) 0.005 % ophthalmic solution Place 1 drop into both eyes at bedtime.    meloxicam (MOBIC) 15 MG tablet Take 1 tablet (15 mg total) by mouth daily.   Vitamin D, Ergocalciferol, (DRISDOL) 1.25 MG (50000 UNIT) CAPS capsule Take 1 capsule (50,000 Units total) by mouth every 7 (seven) days.   No facility-administered encounter medications on file as of 10/29/2020.    Reviewed chart for medication changes since last care coordination call/Pharmacist visit  No medication changes indicated.  Unsuccessful attempt to reach patient to schedule follow up telephone appointment with clinical pharmacist.   Future Appointments  Date Time Provider Department Center  11/02/2020  1:00 PM [facility] SPEECH THERAPIST [facility] [place]  11/02/2020  1:00 PM [facility] R/F 2 [facility] [place]  12/03/2020 10:50 AM [name], NP [facility]  04/05/2021 10:45 AM Tat, Octaviano Batty, DO [facility] None     April D [place],  [facility] Clinical Pharmacist Assistant [phone]

## 2020-10-31 ENCOUNTER — Other Ambulatory Visit: Payer: Self-pay | Admitting: Neurology

## 2020-11-01 ENCOUNTER — Telehealth: Payer: Self-pay

## 2020-11-01 NOTE — Progress Notes (Addendum)
    Chronic Care Management Pharmacy Assistant   Name: Carly [name]  MRN: [id number] DOB: 05-Jun-1945  Reason for Encounter: Medication Review   Recent office visits:  None noted.  Recent consult visits:  None noted  Hospital visits:  None in previous 6 months  Medications: Outpatient Encounter Medications as of 11/01/2020  Medication Sig   AMBULATORY NON FORMULARY MEDICATION Medication Name: Stairlift G20   busPIRone (BUSPAR) 7.5 MG tablet Take 1 tablet (7.5 mg total) by mouth 3 (three) times daily.   carbidopa-levodopa (SINEMET CR) 50-200 MG tablet Take 1 tablet by mouth at bedtime.   carbidopa-levodopa (SINEMET IR) 25-100 MG tablet 2 at 6 AM/2 at 10 AM/1 at 2 PM/1 at 6 PM   Cholecalciferol (VITAMIN D3) 100000 UNIT/GM POWD Take by mouth.   citalopram (CELEXA) 20 MG tablet Take 1 tablet (20 mg total) by mouth daily at 6 (six) AM.   Cyanocobalamin POWD Take by mouth.   ketorolac (ACULAR) 0.5 % ophthalmic solution 1 drop.   latanoprost (XALATAN) 0.005 % ophthalmic solution Place 1 drop into both eyes at bedtime.    meloxicam (MOBIC) 15 MG tablet Take 1 tablet (15 mg total) by mouth daily.   Vitamin D, Ergocalciferol, (DRISDOL) 1.25 MG (50000 UNIT) CAPS capsule Take 1 capsule (50,000 Units total) by mouth every 7 (seven) days.   No facility-administered encounter medications on file as of 11/01/2020.   Reviewed chart for medication changes ahead of medication coordination call.  No OVs, Consults, or hospital visits since last care coordination call/Pharmacist visit. (If appropriate, list visit date, provider name)  No medication changes indicated OR if recent visit, treatment plan here.  BP Readings from Last 3 Encounters:  10/21/20 130/85  09/24/20 122/75  09/15/20 118/78    Lab Results  Component Value Date   HGBA1C 5.8 (H) 08/29/2013     Patient obtains medications through Vials  30 Days   Last adherence delivery included: (medication name and frequency) Several  unsuccessful attempts made to contact patient for medication coordination   Patient obtains medications through Vials  30 Days    Last adherence delivery included:  Citalopram 20mg  1 Tablet at Breakfast vB12 1000mg  Carb/levo 25/100mg  2 tablets at 6 AM, 2 tablets at 10 AM, 1 tablet at 2 PM, 1 tablet at 6 PM, Carbidopa er 50mg  1 Tablet at Bedtime VD3  Buspirone 7.5 mg   Patient is due for next adherence delivery on: 11/11/2020. Called patient and reviewed medications and coordinated delivery.  This delivery to include: carbidopa-levodopa (SINEMET IR) 25-100 MG tablet carbidopa-levodopa (SINEMET CR) 50-200 MG tablet citalopram (CELEXA) 20 MG tablet Vitamin D, Ergocalciferol, (DRISDOL) 1.25 MG (50000 UNIT) CAPS capsule Vitamin B-12 10000 tablet Buspirone 7.5 mg   Patient needs refills for  Carbidopa-levodopa (SINEMET IR) 25-100 MG tablet  Confirmed delivery date of 11/11/2020, advised patient that pharmacy will contact them the morning of delivery.   Star Rating Drugs: No Star Rating Drugs identified.    , [facility] Clinical Pharmacist Assistant  [phone]

## 2020-11-02 ENCOUNTER — Ambulatory Visit (HOSPITAL_COMMUNITY)
Admission: RE | Admit: 2020-11-02 | Discharge: 2020-11-02 | Disposition: A | Discharge status: Home / Self Care | Payer: Medicare HMO | Source: Ambulatory Visit | Attending: Registered Nurse | Admitting: Registered Nurse

## 2020-11-02 ENCOUNTER — Other Ambulatory Visit: Payer: Self-pay

## 2020-11-02 DIAGNOSIS — R1319 Other dysphagia: Secondary | ICD-10-CM

## 2020-11-02 DIAGNOSIS — R1310 Dysphagia, unspecified: Secondary | ICD-10-CM | POA: Diagnosis not present

## 2020-11-02 DIAGNOSIS — G20 Parkinson's disease: Secondary | ICD-10-CM

## 2020-11-02 DIAGNOSIS — R6339 Other feeding difficulties: Secondary | ICD-10-CM | POA: Diagnosis not present

## 2020-11-02 DIAGNOSIS — T17320A Food in larynx causing asphyxiation, initial encounter: Secondary | ICD-10-CM | POA: Diagnosis not present

## 2020-11-02 NOTE — Progress Notes (Signed)
FYI

## 2020-11-08 ENCOUNTER — Other Ambulatory Visit: Payer: Self-pay | Admitting: Neurology

## 2020-11-22 DIAGNOSIS — G20 Parkinson's disease: Secondary | ICD-10-CM | POA: Diagnosis not present

## 2020-11-22 DIAGNOSIS — Z043 Encounter for examination and observation following other accident: Secondary | ICD-10-CM | POA: Diagnosis not present

## 2020-11-22 DIAGNOSIS — I493 Ventricular premature depolarization: Secondary | ICD-10-CM | POA: Diagnosis not present

## 2020-11-22 DIAGNOSIS — M4312 Spondylolisthesis, cervical region: Secondary | ICD-10-CM | POA: Diagnosis not present

## 2020-11-22 DIAGNOSIS — I440 Atrioventricular block, first degree: Secondary | ICD-10-CM | POA: Diagnosis not present

## 2020-11-22 DIAGNOSIS — R69 Illness, unspecified: Secondary | ICD-10-CM | POA: Diagnosis not present

## 2020-11-22 DIAGNOSIS — M50323 Other cervical disc degeneration at C6-C7 level: Secondary | ICD-10-CM | POA: Diagnosis not present

## 2020-11-22 DIAGNOSIS — M50322 Other cervical disc degeneration at C5-C6 level: Secondary | ICD-10-CM | POA: Diagnosis not present

## 2020-11-22 DIAGNOSIS — W19XXXA Unspecified fall, initial encounter: Secondary | ICD-10-CM | POA: Diagnosis not present

## 2020-11-22 DIAGNOSIS — Y998 Other external cause status: Secondary | ICD-10-CM | POA: Diagnosis not present

## 2020-11-22 DIAGNOSIS — R55 Syncope and collapse: Secondary | ICD-10-CM | POA: Diagnosis not present

## 2020-11-22 DIAGNOSIS — R42 Dizziness and giddiness: Secondary | ICD-10-CM | POA: Diagnosis not present

## 2020-11-22 DIAGNOSIS — R531 Weakness: Secondary | ICD-10-CM | POA: Diagnosis not present

## 2020-11-23 DIAGNOSIS — I491 Atrial premature depolarization: Secondary | ICD-10-CM | POA: Diagnosis not present

## 2020-11-23 DIAGNOSIS — M4312 Spondylolisthesis, cervical region: Secondary | ICD-10-CM | POA: Diagnosis not present

## 2020-11-23 DIAGNOSIS — Z043 Encounter for examination and observation following other accident: Secondary | ICD-10-CM | POA: Diagnosis not present

## 2020-11-23 DIAGNOSIS — I4581 Long QT syndrome: Secondary | ICD-10-CM | POA: Diagnosis not present

## 2020-11-23 DIAGNOSIS — B9620 Unspecified Escherichia coli [E. coli] as the cause of diseases classified elsewhere: Secondary | ICD-10-CM | POA: Diagnosis not present

## 2020-11-23 DIAGNOSIS — G20 Parkinson's disease: Secondary | ICD-10-CM | POA: Diagnosis not present

## 2020-11-23 DIAGNOSIS — R69 Illness, unspecified: Secondary | ICD-10-CM | POA: Diagnosis not present

## 2020-11-23 DIAGNOSIS — Y998 Other external cause status: Secondary | ICD-10-CM | POA: Diagnosis not present

## 2020-11-23 DIAGNOSIS — I951 Orthostatic hypotension: Secondary | ICD-10-CM | POA: Diagnosis not present

## 2020-11-23 DIAGNOSIS — R55 Syncope and collapse: Secondary | ICD-10-CM | POA: Diagnosis not present

## 2020-11-23 DIAGNOSIS — N390 Urinary tract infection, site not specified: Secondary | ICD-10-CM | POA: Diagnosis not present

## 2020-11-23 DIAGNOSIS — W19XXXA Unspecified fall, initial encounter: Secondary | ICD-10-CM | POA: Diagnosis not present

## 2020-11-23 DIAGNOSIS — I493 Ventricular premature depolarization: Secondary | ICD-10-CM | POA: Diagnosis not present

## 2020-11-23 DIAGNOSIS — I6523 Occlusion and stenosis of bilateral carotid arteries: Secondary | ICD-10-CM | POA: Diagnosis not present

## 2020-11-23 DIAGNOSIS — M50323 Other cervical disc degeneration at C6-C7 level: Secondary | ICD-10-CM | POA: Diagnosis not present

## 2020-11-23 DIAGNOSIS — I499 Cardiac arrhythmia, unspecified: Secondary | ICD-10-CM | POA: Insufficient documentation

## 2020-11-23 DIAGNOSIS — G903 Multi-system degeneration of the autonomic nervous system: Secondary | ICD-10-CM | POA: Diagnosis not present

## 2020-11-23 DIAGNOSIS — I471 Supraventricular tachycardia: Secondary | ICD-10-CM | POA: Diagnosis not present

## 2020-11-23 DIAGNOSIS — R42 Dizziness and giddiness: Secondary | ICD-10-CM | POA: Diagnosis not present

## 2020-11-23 DIAGNOSIS — M50322 Other cervical disc degeneration at C5-C6 level: Secondary | ICD-10-CM | POA: Diagnosis not present

## 2020-11-25 DIAGNOSIS — N390 Urinary tract infection, site not specified: Secondary | ICD-10-CM | POA: Insufficient documentation

## 2020-11-26 DIAGNOSIS — Z79899 Other long term (current) drug therapy: Secondary | ICD-10-CM | POA: Diagnosis not present

## 2020-11-26 DIAGNOSIS — S72002D Fracture of unspecified part of neck of left femur, subsequent encounter for closed fracture with routine healing: Secondary | ICD-10-CM | POA: Diagnosis not present

## 2020-11-26 DIAGNOSIS — Z87440 Personal history of urinary (tract) infections: Secondary | ICD-10-CM | POA: Diagnosis not present

## 2020-11-26 DIAGNOSIS — N390 Urinary tract infection, site not specified: Secondary | ICD-10-CM | POA: Diagnosis not present

## 2020-11-26 DIAGNOSIS — R69 Illness, unspecified: Secondary | ICD-10-CM | POA: Diagnosis not present

## 2020-11-26 DIAGNOSIS — G20 Parkinson's disease: Secondary | ICD-10-CM | POA: Diagnosis not present

## 2020-11-26 DIAGNOSIS — Z9181 History of falling: Secondary | ICD-10-CM | POA: Diagnosis not present

## 2020-11-26 DIAGNOSIS — I951 Orthostatic hypotension: Secondary | ICD-10-CM | POA: Diagnosis not present

## 2020-11-26 DIAGNOSIS — I4581 Long QT syndrome: Secondary | ICD-10-CM | POA: Diagnosis not present

## 2020-12-03 ENCOUNTER — Encounter: Payer: Self-pay | Admitting: Registered Nurse

## 2020-12-03 ENCOUNTER — Other Ambulatory Visit: Payer: Self-pay

## 2020-12-03 ENCOUNTER — Ambulatory Visit (INDEPENDENT_AMBULATORY_CARE_PROVIDER_SITE_OTHER): Payer: Medicare HMO | Admitting: Registered Nurse

## 2020-12-03 ENCOUNTER — Telehealth: Payer: Self-pay

## 2020-12-03 VITALS — Temp 98.0°F | Resp 16 | Ht 65.0 in

## 2020-12-03 DIAGNOSIS — R300 Dysuria: Secondary | ICD-10-CM | POA: Diagnosis not present

## 2020-12-03 LAB — POCT URINALYSIS DIPSTICK
Bilirubin, UA: NEGATIVE
Glucose, UA: NEGATIVE
Ketones, UA: POSITIVE
Nitrite, UA: NEGATIVE
Protein, UA: POSITIVE — AB
Spec Grav, UA: 1.015 (ref 1.010–1.025)
Urobilinogen, UA: 0.2 E.U./dL
pH, UA: 5 (ref 5.0–8.0)

## 2020-12-03 MED ORDER — SULFAMETHOXAZOLE-TRIMETHOPRIM 800-160 MG PO TABS: 1.0000 | ORAL_TABLET | Freq: Two times a day (BID) | ORAL | 0 refills | Status: DC

## 2020-12-03 NOTE — Patient Instructions (Signed)
Ms. Demica Zook to meet you (in person!)  Let's see how the urine studies look today.  Follow up with Dr. Beverely Low in 6 weeks to ensure she's up to date on your summer.  The shoes are from [employer] - I highly recommend them! Definitely size down.   Thank you  Rich

## 2020-12-03 NOTE — Progress Notes (Addendum)
Chronic Care Management Pharmacy Assistant   Name: Carly [name]  MRN: [id number] DOB: [birth year]   Reason for Encounter: Monthly Medication Coordination Call    Recent office visits:  12/03/20 Carly Agee, NP - Family Medicine - Dysuria - Labs ordered. Sulfamethoxazole-trimethoprim (BACTRIM DS) 800-160 MG tablet prescribed. Follow up in 6 weeks.   Recent consult visits:  11/26/20 Theodoro Grist, MD - Internal Medicine (Televisit)- Acute Cystitis -  No medication changes. Follow up not indicated.   Hospital visits: 11/22/20 - 11/25/20  Medication Reconciliation was completed by comparing discharge summary, patient's EMR and Pharmacy list, and upon discussion with patient.  Admitted to the hospital on 11/22/20 due to UTI. Discharge date was 11/25/20. Discharged from [facility].    New?Medications Started at [facility] Discharge:?? Started Cefdinir 300 MG capsule Take 1 capsule (300 mg total) by mouth 2 times daily for 4 days  Medication Changes at Hospital Discharge: No changes noted.  Medications Discontinued at Hospital Discharge: No medications were discontinued at discharge.  Medications that remain the same after Hospital Discharge:??  All other medications will remain the same.    Medications: Outpatient Encounter Medications as of 12/03/2020  Medication Sig   AMBULATORY NON FORMULARY MEDICATION Medication Name: Stairlift G20   busPIRone (BUSPAR) 7.5 MG tablet Take 1 tablet (7.5 mg total) by mouth 3 (three) times daily.   carbidopa-levodopa (SINEMET CR) 50-200 MG tablet Take 1 tablet by mouth at bedtime.   carbidopa-levodopa (SINEMET IR) 25-100 MG tablet TAKE TWO TABLETS BY MOUTH AT 6am & 10am and TAKE ONE TABLET BY MOUTH AT 2pm & 6pm   Cholecalciferol (VITAMIN D3) 100000 UNIT/GM POWD Take by mouth.   citalopram (CELEXA) 20 MG tablet Take 1 tablet (20 mg total) by mouth daily at 6 (six) AM. (Patient not taking: Reported on 12/03/2020)   Cyanocobalamin POWD  Take by mouth. (Patient not taking: Reported on 12/03/2020)   ketorolac (ACULAR) 0.5 % ophthalmic solution 1 drop. (Patient not taking: Reported on 12/03/2020)   latanoprost (XALATAN) 0.005 % ophthalmic solution Place 1 drop into both eyes at bedtime.  (Patient not taking: Reported on 12/03/2020)   meloxicam (MOBIC) 15 MG tablet Take 1 tablet (15 mg total) by mouth daily.   sulfamethoxazole-trimethoprim (BACTRIM DS) 800-160 MG tablet Take 1 tablet by mouth 2 (two) times daily.   Vitamin D, Ergocalciferol, (DRISDOL) 1.25 MG (50000 UNIT) CAPS capsule Take 1 capsule (50,000 Units total) by mouth every 7 (seven) days.   No facility-administered encounter medications on file as of 12/03/2020.   Reviewed chart for medication changes ahead of medication coordination call.  No OVs, Consults, or hospital visits since last care coordination call/Pharmacist visit. (If appropriate, list visit date, provider name)  No medication changes indicated OR if recent visit, treatment plan here.  BP Readings from Last 3 Encounters:  10/21/20 130/85  09/24/20 122/75  09/15/20 118/78    Lab Results  Component Value Date   HGBA1C 5.8 (H) 08/29/2013     Patient obtains medications through Vials  30 Days   Last adherence delivery included: (medication name and frequency)  carbidopa-levodopa (SINEMET IR) 25-100 MG tablet carbidopa-levodopa (SINEMET CR) 50-200 MG tablet citalopram (CELEXA) 20 MG tablet Vitamin D, Ergocalciferol, (DRISDOL) 1.25 MG (50000 UNIT) CAPS capsule Vitamin B-12 10000 tablet Buspirone 7.5 mg    Patient is due for next adherence delivery on: 12/13/2020 Called patient and reviewed medications and coordinated delivery.   This delivery to include:  carbidopa-levodopa (SINEMET IR) 25-100 MG tablet  carbidopa-levodopa (SINEMET CR) 50-200 MG tablet Vitamin D, Ergocalciferol, (DRISDOL) 1.25 MG (50000 UNIT) CAPS capsule Vitamin B-12 10000 tablet Buspirone 7.5 mg   *Patient reported she has  been taken off Citalopram 20 mg.  Confirmed delivery date of 12/13/20, advised patient that pharmacy will contact them the morning of delivery.   Star Rating Drugs: No Star Rating Drugs noted.    Future Appointments  Date Time Provider Department Center  01/14/2021  1:00 PM Sheliah Hatch, MD [facility]  04/05/2021 10:45 AM Tat, Octaviano Batty, DO [facility] None   Eugenie Filler, CCMA Clinical Pharmacist Assistant  [phone]  Time Spent: 44 minutes

## 2020-12-03 NOTE — Progress Notes (Signed)
Established Patient Office Visit  Subjective:  Patient ID: Carly [name], female    DOB: [birth year]  Age: 75 y.o. MRN: [id number]  CC:  Chief Complaint  Patient presents with   Depression    Pt reports decrease to 20 mg citalopram, pt care giver concerned she is still having side effects from the medication, pt was recently seen in hospital due to falls   Nocturia    Caregiver reports frequent urination, urgent night time urination previous abx was not taken appropriately     HPI Carly [name] presents for follow up  Had reduced citalopram to 20mg  po qd  11/22/20 - fall - 4 day admission Citalopram dc'd during admission with concern for QTC prolongation  No falls since dc, but has had episodes of hypotension and dizziness.  Has heart monitor on - 2 weeks - will follow up with cardiology in Oct  UTI symptoms - urgency, some dysuria, frequency   Past Medical History:  Diagnosis Date   Anemia    Anterior cervical lymphadenopathy 05/14/2015   B12 deficiency 01/01/2014   Bronchitis 04/12/2011   Color vision defect 05/21/2019   Decreased visual acuity 05/21/2019   Degenerative arthritis of left knee 05/18/2017   DVT of lower extremity, bilateral    years ago    Generalized anxiety disorder with panic attacks 01/31/2007   HTN (hypertension) 01/31/2007   Hypertension    Mild neurocognitive disorder due to Parkinson's disease 09/30/2019   Osteopenia 02/15/2015   Parkinson's disease    Primary osteoarthritis of left knee 05/21/2017   Vitamin D deficiency     Past Surgical History:  Procedure Laterality Date   BREAST BIOPSY     DILATION AND CURETTAGE OF UTERUS     EYE SURGERY Right 05/21/2018   KNEE SURGERY Left 01/2017   REFRACTIVE SURGERY Bilateral    TOTAL KNEE ARTHROPLASTY Left 05/21/2017   Procedure: TOTAL KNEE ARTHROPLASTY;  Surgeon: 05/23/2017, MD;  Location: [facility] OR;  Service: Orthopedics;  Laterality: Left;   wrist sx      Family History  Problem Relation Age  of Onset   Heart attack Father    Heart failure Mother    Cancer Brother        brain   Cancer Brother        brain   Healthy Sister    Stroke Sister    Arthritis Son     Social History   Socioeconomic History   Marital status: Significant Other    Spouse name: Not on file   Number of children: 1   Years of education: 12   Highest education level: High school graduate  Occupational History   Occupation: Retired  Tobacco Use   Smoking status: Never   Smokeless tobacco: Never  Gean [place] Use: Never used  Substance and Sexual Activity   Alcohol use: No   Drug use: No   Sexual activity: Not on file  Other Topics Concern   Not on file  Social History Narrative   Patient lives at home with partner Building services engineer.    Patient has one adult child.    Patient has 14 years of education.    Patient does not work.   Caffeine consumption is 1 cup daily    Social Determinants of Health   Financial Resource Strain: Low Risk    Difficulty of Paying Living Expenses: Not hard at all  Food Insecurity: No Food Insecurity   Worried About Running  Out of Food in the Last Year: Never true   Ran Out of Food in the Last Year: Never true  Transportation Needs: No Transportation Needs   Lack of Transportation (Medical): No   Lack of Transportation (Non-Medical): No  Physical Activity: Inactive   Days of Exercise per Week: 0 days   Minutes of Exercise per Session: 0 min  Stress: No Stress Concern Present   Feeling of Stress : Only a little  Social Connections: Moderately Isolated   Frequency of Communication with Friends and Family: More than three times a week   Frequency of Social Gatherings with Friends and Family: More than three times a week   Attends Religious Services: Never   Database administrator or Organizations: No   Attends Engineer, structural: Never   Marital Status: Living with partner  Intimate Partner Violence: Not At Risk   Fear of Current or  Ex-Partner: No   Emotionally Abused: No   Physically Abused: No   Sexually Abused: No    Outpatient Medications Prior to Visit  Medication Sig Dispense Refill   AMBULATORY NON FORMULARY MEDICATION Medication Name: Stairlift G20 1 Device 0   busPIRone (BUSPAR) 7.5 MG tablet Take 1 tablet (7.5 mg total) by mouth 3 (three) times daily. 90 tablet 1   carbidopa-levodopa (SINEMET CR) 50-200 MG tablet Take 1 tablet by mouth at bedtime. 90 tablet 0   carbidopa-levodopa (SINEMET IR) 25-100 MG tablet TAKE TWO TABLETS BY MOUTH AT 6am & 10am and TAKE ONE TABLET BY MOUTH AT 2pm & 6pm 540 tablet 0   Cholecalciferol (VITAMIN D3) 100000 UNIT/GM POWD Take by mouth.     meloxicam (MOBIC) 15 MG tablet Take 1 tablet (15 mg total) by mouth daily. 30 tablet 0   Vitamin D, Ergocalciferol, (DRISDOL) 1.25 MG (50000 UNIT) CAPS capsule Take 1 capsule (50,000 Units total) by mouth every 7 (seven) days. 12 capsule 0   citalopram (CELEXA) 20 MG tablet Take 1 tablet (20 mg total) by mouth daily at 6 (six) AM. (Patient not taking: Reported on 12/03/2020) 90 tablet 0   Cyanocobalamin POWD Take by mouth. (Patient not taking: Reported on 12/03/2020)     ketorolac (ACULAR) 0.5 % ophthalmic solution 1 drop. (Patient not taking: Reported on 12/03/2020)     latanoprost (XALATAN) 0.005 % ophthalmic solution Place 1 drop into both eyes at bedtime.  (Patient not taking: Reported on 12/03/2020)     No facility-administered medications prior to visit.    Allergies  Allergen Reactions   Codeine Nausea Only and Other (See Comments)    hallucinations    ROS Review of Systems    Objective:    Physical Exam  Temp 98 F (36.7 C) (Temporal)   Resp 16   Ht 5\' 5"  (1.651 m)   SpO2 97%   BMI 27.79 kg/m  Wt Readings from Last 3 Encounters:  09/24/20 167 lb (75.8 kg)  09/15/20 167 lb (75.8 kg)  09/14/20 166 lb 12.8 oz (75.7 kg)     Health Maintenance Due  Topic Date Due   MAMMOGRAM  11/14/2018   INFLUENZA VACCINE  11/01/2020    Fecal DNA (Cologuard)  11/02/2020    There are no preventive care reminders to display for this patient.  Lab Results  Component Value Date   TSH 0.96 03/03/2020   Lab Results  Component Value Date   WBC 8.5 03/03/2020   HGB 12.9 03/03/2020   HCT 39.4 03/03/2020   MCV 91.2 03/03/2020  PLT 146.0 (L) 03/03/2020   Lab Results  Component Value Date   NA 137 03/03/2020   K 4.8 03/03/2020   CO2 30 03/03/2020   GLUCOSE 104 (H) 03/03/2020   BUN 25 (H) 03/03/2020   CREATININE 1.24 (H) 03/03/2020   BILITOT 0.8 03/03/2020   ALKPHOS 94 03/03/2020   AST 16 03/03/2020   ALT 5 03/03/2020   PROT 7.1 03/03/2020   ALBUMIN 4.2 03/03/2020   CALCIUM 9.2 03/03/2020   ANIONGAP 11 05/22/2017   GFR 43.53 (L) 03/03/2020   Lab Results  Component Value Date   CHOL 176 03/03/2020   Lab Results  Component Value Date   HDL 65.50 03/03/2020   Lab Results  Component Value Date   LDLCALC 98 03/03/2020   Lab Results  Component Value Date   TRIG 61.0 03/03/2020   Lab Results  Component Value Date   CHOLHDL 3 03/03/2020   Lab Results  Component Value Date   HGBA1C 5.8 (H) 08/29/2013      Assessment & Plan:   Problem List Items Addressed This Visit   None Visit Diagnoses     Dysuria    -  Primary   Relevant Medications   sulfamethoxazole-trimethoprim (BACTRIM DS) 800-160 MG tablet   Other Relevant Orders   POCT Urinalysis Dipstick (Completed)   Urine Culture (Completed)       Meds ordered this encounter  Medications   DISCONTD: sulfamethoxazole-trimethoprim (BACTRIM DS) 800-160 MG tablet    Sig: Take 1 tablet by mouth 2 (two) times daily.    Dispense:  6 tablet    Refill:  0    Order Specific Question:   Supervising Provider    Answer:   Neva Seat, JEFFREY R [[id number]]   sulfamethoxazole-trimethoprim (BACTRIM DS) 800-160 MG tablet    Sig: Take 1 tablet by mouth 2 (two) times daily.    Dispense:  6 tablet    Refill:  0    Order Specific Question:   Supervising Provider     Answer:   Neva Seat, JEFFREY R [[id number]]    Follow-up: Return in about 6 weeks (around 01/14/2021) for Follow up with Dr. Beverely Low - make sure she's up to date on recent events.   PLAN Plan to follow up with cardiology as scheduled. Ok to stay on lower dose of medication. Urinary symptoms - discussed role of potential UTI in cognitive and constitutional changes in older adults. Will treat presumptively with bactrim po bid x 3 days, send urine culture. Follow up as warranted Discussed home safety with patient, sister, and caretaker. Reviewed plan with each. They all voice understanding of plan and follow up - will follow up with Dr. Beverely Low in around 6 weeks. Patient encouraged to call clinic with any questions, comments, or concerns.  [name], NP

## 2020-12-07 LAB — URINE CULTURE
MICRO NUMBER:: 12329565
SPECIMEN QUALITY:: ADEQUATE

## 2020-12-22 DIAGNOSIS — R55 Syncope and collapse: Secondary | ICD-10-CM | POA: Diagnosis not present

## 2020-12-23 DIAGNOSIS — I495 Sick sinus syndrome: Secondary | ICD-10-CM | POA: Diagnosis not present

## 2020-12-23 DIAGNOSIS — I491 Atrial premature depolarization: Secondary | ICD-10-CM | POA: Diagnosis not present

## 2020-12-23 DIAGNOSIS — I471 Supraventricular tachycardia: Secondary | ICD-10-CM | POA: Diagnosis not present

## 2020-12-23 DIAGNOSIS — I4891 Unspecified atrial fibrillation: Secondary | ICD-10-CM | POA: Diagnosis not present

## 2020-12-23 DIAGNOSIS — I493 Ventricular premature depolarization: Secondary | ICD-10-CM | POA: Diagnosis not present

## 2020-12-30 ENCOUNTER — Telehealth: Payer: Self-pay

## 2020-12-30 DIAGNOSIS — F411 Generalized anxiety disorder: Secondary | ICD-10-CM

## 2020-12-30 NOTE — Progress Notes (Signed)
Chronic Care Management Pharmacy Assistant   Name: Carly [name]  MRN: [id number] DOB: 1945-11-16   Reason for Encounter: Medication Coordination Call    Recent office visits:  None noted.   Recent consult visits:  None noted.   Hospital visits: 11/22/20 - 11/25/20   Medication Reconciliation was completed by comparing discharge summary, patient's EMR and Pharmacy list, and upon discussion with patient.   Admitted to the hospital on 11/22/20 due to UTI. Discharge date was 11/25/20. Discharged from [facility].     New?Medications Started at [facility] Discharge:?? Started Cefdinir 300 MG capsule Take 1 capsule (300 mg total) by mouth 2 times daily for 4 days   Medication Changes at Hospital Discharge: No changes noted.   Medications Discontinued at Hospital Discharge: No medications were discontinued at discharge.   Medications that remain the same after Hospital Discharge:??  All other medications will remain the same.    Medications: Outpatient Encounter Medications as of 12/30/2020  Medication Sig   AMBULATORY NON FORMULARY MEDICATION Medication Name: Stairlift G20   busPIRone (BUSPAR) 7.5 MG tablet Take 1 tablet (7.5 mg total) by mouth 3 (three) times daily.   carbidopa-levodopa (SINEMET CR) 50-200 MG tablet Take 1 tablet by mouth at bedtime.   carbidopa-levodopa (SINEMET IR) 25-100 MG tablet TAKE TWO TABLETS BY MOUTH AT 6am & 10am and TAKE ONE TABLET BY MOUTH AT 2pm & 6pm   Cholecalciferol (VITAMIN D3) 100000 UNIT/GM POWD Take by mouth.   citalopram (CELEXA) 20 MG tablet Take 1 tablet (20 mg total) by mouth daily at 6 (six) AM. (Patient not taking: Reported on 12/03/2020)   Cyanocobalamin POWD Take by mouth. (Patient not taking: Reported on 12/03/2020)   ketorolac (ACULAR) 0.5 % ophthalmic solution 1 drop. (Patient not taking: Reported on 12/03/2020)   latanoprost (XALATAN) 0.005 % ophthalmic solution Place 1 drop into both eyes at bedtime.  (Patient not taking:  Reported on 12/03/2020)   meloxicam (MOBIC) 15 MG tablet Take 1 tablet (15 mg total) by mouth daily.   sulfamethoxazole-trimethoprim (BACTRIM DS) 800-160 MG tablet Take 1 tablet by mouth 2 (two) times daily.   Vitamin D, Ergocalciferol, (DRISDOL) 1.25 MG (50000 UNIT) CAPS capsule Take 1 capsule (50,000 Units total) by mouth every 7 (seven) days.   No facility-administered encounter medications on file as of 12/30/2020.    Reviewed chart for medication changes ahead of medication coordination call.  No OVs, Consults, or hospital visits since last care coordination call/Pharmacist visit. (If appropriate, list visit date, provider name)  No medication changes indicated OR if recent visit, treatment plan here.  BP Readings from Last 3 Encounters:  10/21/20 130/85  09/24/20 122/75  09/15/20 118/78    Lab Results  Component Value Date   HGBA1C 5.8 (H) 08/29/2013     Patient obtains medications through Vials  w/o safety caps 30 Days    Last adherence delivery included: (medication name and frequency)  carbidopa-levodopa (SINEMET IR) 25-100 MG tablet carbidopa-levodopa (SINEMET CR) 50-200 MG tablet Vitamin D, Ergocalciferol, (DRISDOL) 1.25 MG (50000 UNIT) CAPS capsule Vitamin B-12 10000 tablet Buspirone 7.5 mg    Patient is due for next adherence delivery on: 01/11/21. Called patient and reviewed medications and coordinated delivery.   This delivery to include:  carbidopa-levodopa (SINEMET IR) 25-100 MG tablet carbidopa-levodopa (SINEMET CR) 50-200 MG tablet Vitamin D, Ergocalciferol, (DRISDOL) 1.25 MG (50000 UNIT) CAPS capsule Vitamin B-12 10000 tablet Buspirone 7.5 mg    Patient needs refills for: Buspirone 7.5 mg from  PCP 30 day supply  Confirmed delivery date of 01/11/21, advised patient that pharmacy will contact them the morning of delivery.   Care Gaps  AWV: done 03/03/20 (due 03/03/21) Colonoscopy: unknown DM Eye Exam: done 11/17/19 DM Foot Exam:  N/A Microalbumin: N/A HbgAIC: N/A DEXA: last done 03/02/15 overdue (has been ordered) Mammogram: last done 11/13/17 overdue (has been ordered)   Star Rating Drugs: No Star Rating Drugs Noted.   Future Appointments  Date Time Provider Department Center  01/14/2021  1:00 PM Sheliah Hatch, MD [facility]  04/05/2021 10:45 AM Tat, Octaviano Batty, DO [facility] None    Eugenie Filler, CCMA Clinical Pharmacist Assistant  [phone]  Time Spent: 40 minutes

## 2021-01-04 MED ORDER — BUSPIRONE HCL 7.5 MG PO TABS: 7.5000 mg | ORAL_TABLET | Freq: Three times a day (TID) | ORAL | 1 refills | Status: DC

## 2021-01-04 NOTE — Addendum Note (Signed)
Addended by: Dahlia Byes on: 01/04/2021 08:51 AM   Modules accepted: Orders

## 2021-01-14 ENCOUNTER — Other Ambulatory Visit: Payer: Self-pay

## 2021-01-14 ENCOUNTER — Telehealth (INDEPENDENT_AMBULATORY_CARE_PROVIDER_SITE_OTHER): Payer: Medicare HMO | Admitting: Family Medicine

## 2021-01-14 ENCOUNTER — Encounter: Payer: Self-pay | Admitting: Family Medicine

## 2021-01-14 ENCOUNTER — Ambulatory Visit: Payer: Medicare HMO | Admitting: Family Medicine

## 2021-01-14 DIAGNOSIS — F411 Generalized anxiety disorder: Secondary | ICD-10-CM | POA: Diagnosis not present

## 2021-01-14 DIAGNOSIS — R350 Frequency of micturition: Secondary | ICD-10-CM | POA: Diagnosis not present

## 2021-01-14 DIAGNOSIS — I951 Orthostatic hypotension: Secondary | ICD-10-CM | POA: Diagnosis not present

## 2021-01-14 DIAGNOSIS — R69 Illness, unspecified: Secondary | ICD-10-CM | POA: Diagnosis not present

## 2021-01-14 MED ORDER — CEPHALEXIN 500 MG PO CAPS: 500.0000 mg | ORAL_CAPSULE | Freq: Two times a day (BID) | ORAL | 0 refills | Status: AC

## 2021-01-14 MED ORDER — BUSPIRONE HCL 10 MG PO TABS: 10.0000 mg | ORAL_TABLET | Freq: Three times a day (TID) | ORAL | 3 refills | Status: DC

## 2021-01-14 NOTE — Progress Notes (Signed)
Virtual Visit via Video   I connected with patient on 01/14/21 at  1:00 PM EDT by a video enabled telemedicine application and verified that I am speaking with the correct person using two identifiers.  Location patient: Home Location provider: [place] [facility] Office Persons participating in the virtual visit: Patient, Provider, CMA Tresa Endo C)  I discussed the limitations of evaluation and management by telemedicine and the availability of in person appointments. The patient expressed understanding and agreed to proceed.  Subjective:   HPI:   Urinary frequency- sxs started 'at least a week'.  + odor.  Some abdominal discomfort.  + hesitancy.  Some dysuria.  Anxiety- pt decreased Citalopram this summer to 20mg .  This is in addition to her Buspar 7.5mg  TID.  Was taken off Citalopram completely after orthostatic hypotension.  Pt reports some trouble sleeping- caregiver reports frequent nightmares.    Fainting spells- caregiver indicates pt has 1-3 episodes/week.  Had a spell this AM in which HR 60, BP 82/60.  Has seen cardiology.  Had a holter monitor and f/u pending.    ROS:   See pertinent positives and negatives per HPI.  Patient Active Problem List   Diagnosis Date Noted   UTI (urinary tract infection) 11/25/2020   Cardiac arrhythmia 11/23/2020   Other dysphagia 10/21/2020   Obesity (BMI 30-39.9) 03/03/2020   Mild neurocognitive disorder due to Parkinson's disease 09/30/2019   Color vision defect 05/21/2019   Decreased visual acuity 05/21/2019   Vitamin D deficiency 11/07/2017   Primary osteoarthritis of left knee 05/21/2017   Degenerative arthritis of left knee 05/18/2017   Anterior cervical lymphadenopathy 05/14/2015   Osteopenia 02/15/2015   Physical exam 02/09/2014   B12 deficiency 01/01/2014   Achilles tendinitis 01/31/2010   Parkinson's disease 07/08/2008   Tremor, right hand 04/07/2008   Anxiety state 01/31/2007   HTN (hypertension) 01/31/2007    Social  History   Tobacco Use   Smoking status: Never   Smokeless tobacco: Never  Substance Use Topics   Alcohol use: No    Current Outpatient Medications:    AMBULATORY NON FORMULARY MEDICATION, Medication Name: Stairlift G20, Disp: 1 Device, Rfl: 0   busPIRone (BUSPAR) 7.5 MG tablet, Take 1 tablet (7.5 mg total) by mouth 3 (three) times daily., Disp: 90 tablet, Rfl: 1   carbidopa-levodopa (SINEMET CR) 50-200 MG tablet, Take 1 tablet by mouth at bedtime., Disp: 90 tablet, Rfl: 0   carbidopa-levodopa (SINEMET IR) 25-100 MG tablet, TAKE TWO TABLETS BY MOUTH AT 6am & 10am and TAKE ONE TABLET BY MOUTH AT 2pm & 6pm, Disp: 540 tablet, Rfl: 0   Cholecalciferol (VITAMIN D3) 100000 UNIT/GM POWD, Take by mouth., Disp: , Rfl:    citalopram (CELEXA) 20 MG tablet, Take 1 tablet (20 mg total) by mouth daily at 6 (six) AM., Disp: 90 tablet, Rfl: 0   Cyanocobalamin POWD, Take by mouth., Disp: , Rfl:    ketorolac (ACULAR) 0.5 % ophthalmic solution, , Disp: , Rfl:    latanoprost (XALATAN) 0.005 % ophthalmic solution, Place 1 drop into both eyes at bedtime., Disp: , Rfl:    meloxicam (MOBIC) 15 MG tablet, Take 1 tablet (15 mg total) by mouth daily., Disp: 30 tablet, Rfl: 0   sulfamethoxazole-trimethoprim (BACTRIM DS) 800-160 MG tablet, Take 1 tablet by mouth 2 (two) times daily., Disp: 6 tablet, Rfl: 0   Vitamin D, Ergocalciferol, (DRISDOL) 1.25 MG (50000 UNIT) CAPS capsule, Take 1 capsule (50,000 Units total) by mouth every 7 (seven) days., Disp: 12  capsule, Rfl: 0  Allergies  Allergen Reactions   Codeine Nausea Only and Other (See Comments)    hallucinations    Objective:   There were no vitals taken for this visit. AAOx3, NAD NCAT, EOMI No obvious CN deficits Coloring WNL Pt is able to speak clearly, coherently without shortness of breath or increased work of breathing.  Speech is soft, halting  Thought process is linear.  Mood is appropriate. Flat affect  Assessment and Plan:   Urinary frequency-  recurrent problem for pt.  Has had recurrent UTIs and this again sounds suspicious.  Will treat w/ Keflex and wait on UA and culture.  Pt expressed understanding and is in agreement w/ plan.   Anxiety- ongoing issue for pt.  Stopped Citalopram due to increased frequency of falls.  Will increase her Buspar to 10mg  TID and monitor for improvement.  Do not want to treat insomnia b/c anything sedating will increase fall risk  Orthostatic hypotension- often seen w/ Parkinson's.  Has had full cardiac work up w/ holter monitor.  They have f/u appt pending.  Encouraged increased water intake and changing positions slowly   , MD 01/14/2021

## 2021-01-25 ENCOUNTER — Telehealth: Payer: Self-pay

## 2021-01-25 NOTE — Telephone Encounter (Signed)
Error

## 2021-01-26 ENCOUNTER — Telehealth (INDEPENDENT_AMBULATORY_CARE_PROVIDER_SITE_OTHER): Payer: Medicare HMO | Admitting: Registered Nurse

## 2021-01-26 ENCOUNTER — Other Ambulatory Visit: Payer: Self-pay

## 2021-01-26 VITALS — BP 120/56 | HR 58 | Temp 97.6°F

## 2021-01-26 DIAGNOSIS — R350 Frequency of micturition: Secondary | ICD-10-CM | POA: Diagnosis not present

## 2021-01-26 DIAGNOSIS — R300 Dysuria: Secondary | ICD-10-CM

## 2021-01-26 DIAGNOSIS — N390 Urinary tract infection, site not specified: Secondary | ICD-10-CM | POA: Diagnosis not present

## 2021-01-26 DIAGNOSIS — N898 Other specified noninflammatory disorders of vagina: Secondary | ICD-10-CM | POA: Diagnosis not present

## 2021-01-26 MED ORDER — SULFAMETHOXAZOLE-TRIMETHOPRIM 800-160 MG PO TABS: 1.0000 | ORAL_TABLET | Freq: Two times a day (BID) | ORAL | 0 refills | Status: DC

## 2021-01-26 NOTE — Progress Notes (Signed)
Telemedicine Encounter- SOAP NOTE Established Patient  This telephone encounter was conducted with the patient's (or proxy's) verbal consent via audio telecommunications: yes/no: Yes Patient was instructed to have this encounter in a suitably private space; and to only have persons present to whom they give permission to participate. In addition, patient identity was confirmed by use of name plus two identifiers (DOB and address).  I discussed the limitations, risks, security and privacy concerns of performing an evaluation and management service by telephone and the availability of in person appointments. I also discussed with the patient that there may be a patient responsible charge related to this service. The patient expressed understanding and agreed to proceed.  I spent a total of 17 minutes talking with the patient or their proxy.  Patient at home Provider in office  Participants: [name], NP and [name]  Chief Complaint  Patient presents with   Urinary Tract Infection    Patient states she has been experiencing some UTI since 01/22/2021. Patient urine frequency and some abdominal pressure and pain, odor ,discharge.    Subjective   Carly [name] is a 75 y.o. established patient. Telephone visit today for UTI  HPI Ongoing 3-4 days Seen via virtual visit by Dr. Beverely Low, her pcp, on 01/14/21. Given cephalexin 500mg  po bid.  Responded well after a few days, by the 19th feeling back to baseline.  Now again having dysuria, frequency, urgency, and now some discharge. Caretaker notes that they have been taking routine hygiene and lifestyle steps to avoid UTI  No constitutional, cognitive, or neuro symptoms at this time.  Has used azo q12h with some relief.  Patient Active Problem List   Diagnosis Date Noted   UTI (urinary tract infection) 11/25/2020   Cardiac arrhythmia 11/23/2020   Other dysphagia 10/21/2020   Obesity (BMI 30-39.9) 03/03/2020   Mild  neurocognitive disorder due to Parkinson's disease 09/30/2019   Color vision defect 05/21/2019   Decreased visual acuity 05/21/2019   Vitamin D deficiency 11/07/2017   Primary osteoarthritis of left knee 05/21/2017   Degenerative arthritis of left knee 05/18/2017   Anterior cervical lymphadenopathy 05/14/2015   Osteopenia 02/15/2015   Physical exam 02/09/2014   B12 deficiency 01/01/2014   Achilles tendinitis 01/31/2010   Parkinson's disease 07/08/2008   Tremor, right hand 04/07/2008   Anxiety state 01/31/2007   HTN (hypertension) 01/31/2007    Past Medical History:  Diagnosis Date   Anemia    Anterior cervical lymphadenopathy 05/14/2015   B12 deficiency 01/01/2014   Bronchitis 04/12/2011   Color vision defect 05/21/2019   Decreased visual acuity 05/21/2019   Degenerative arthritis of left knee 05/18/2017   DVT of lower extremity, bilateral    years ago    Generalized anxiety disorder with panic attacks 01/31/2007   HTN (hypertension) 01/31/2007   Hypertension    Mild neurocognitive disorder due to Parkinson's disease 09/30/2019   Osteopenia 02/15/2015   Parkinson's disease    Primary osteoarthritis of left knee 05/21/2017   Vitamin D deficiency     Current Outpatient Medications  Medication Sig Dispense Refill   AMBULATORY NON FORMULARY MEDICATION Medication Name: Stairlift G20 1 Device 0   busPIRone (BUSPAR) 10 MG tablet Take 1 tablet (10 mg total) by mouth 3 (three) times daily. 90 tablet 3   carbidopa-levodopa (SINEMET CR) 50-200 MG tablet Take 1 tablet by mouth at bedtime. 90 tablet 0   carbidopa-levodopa (SINEMET IR) 25-100 MG tablet TAKE TWO TABLETS BY MOUTH AT 6am &  10am and TAKE ONE TABLET BY MOUTH AT 2pm & 6pm 540 tablet 0   Cholecalciferol (VITAMIN D3) 100000 UNIT/GM POWD Take by mouth.     citalopram (CELEXA) 20 MG tablet Take 1 tablet (20 mg total) by mouth daily at 6 (six) AM. 90 tablet 0   Cyanocobalamin POWD Take by mouth.     ketorolac (ACULAR) 0.5 % ophthalmic  solution      latanoprost (XALATAN) 0.005 % ophthalmic solution Place 1 drop into both eyes at bedtime.     meloxicam (MOBIC) 15 MG tablet Take 1 tablet (15 mg total) by mouth daily. 30 tablet 0   sulfamethoxazole-trimethoprim (BACTRIM DS) 800-160 MG tablet Take 1 tablet by mouth 2 (two) times daily. 14 tablet 0   Vitamin D, Ergocalciferol, (DRISDOL) 1.25 MG (50000 UNIT) CAPS capsule Take 1 capsule (50,000 Units total) by mouth every 7 (seven) days. 12 capsule 0   No current facility-administered medications for this visit.    Allergies  Allergen Reactions   Codeine Nausea Only and Other (See Comments)    hallucinations    Social History   Socioeconomic History   Marital status: Significant Other    Spouse name: Not on file   Number of children: 1   Years of education: 12   Highest education level: High school graduate  Occupational History   Occupation: Retired  Tobacco Use   Smoking status: Never   Smokeless tobacco: Never  Vaping Use   Vaping Use: Never used  Substance and Sexual Activity   Alcohol use: No   Drug use: No   Sexual activity: Not on file  Other Topics Concern   Not on file  Social History Narrative   Patient lives at home with partner Demetrios Loll.    Patient has one adult child.    Patient has 14 years of education.    Patient does not work.   Caffeine consumption is 1 cup daily    Social Determinants of Health   Financial Resource Strain: Low Risk    Difficulty of Paying Living Expenses: Not hard at all  Food Insecurity: No Food Insecurity   Worried About Programme researcher, broadcasting/film/video in the Last Year: Never true   Ran Out of Food in the Last Year: Never true  Transportation Needs: No Transportation Needs   Lack of Transportation (Medical): No   Lack of Transportation (Non-Medical): No  Physical Activity: Inactive   Days of Exercise per Week: 0 days   Minutes of Exercise per Session: 0 min  Stress: No Stress Concern Present   Feeling of Stress : Only a  little  Social Connections: Moderately Isolated   Frequency of Communication with Friends and Family: More than three times a week   Frequency of Social Gatherings with Friends and Family: More than three times a week   Attends Religious Services: Never   Database administrator or Organizations: No   Attends Engineer, structural: Never   Marital Status: Living with partner  Intimate Partner Violence: Not At Risk   Fear of Current or Ex-Partner: No   Emotionally Abused: No   Physically Abused: No   Sexually Abused: No    Review of Systems  Constitutional: Negative.   HENT: Negative.    Eyes: Negative.   Respiratory: Negative.    Cardiovascular: Negative.   Gastrointestinal: Negative.   Genitourinary:  Positive for dysuria, frequency and urgency. Negative for flank pain and hematuria.  Musculoskeletal: Negative.   Skin: Negative.  Neurological: Negative.   Endo/Heme/Allergies: Negative.   Psychiatric/Behavioral: Negative.    All other systems reviewed and are negative.  Objective   Vitals as reported by the patient: Today's Vitals   01/26/21 0838  BP: (!) 120/56  Pulse: (!) 58  Temp: 97.6 F (36.4 C)  TempSrc: Temporal    Presleigh was seen today for urinary tract infection.  Diagnoses and all orders for this visit:  Urinary frequency -     Urinalysis, Routine w reflex microscopic; Future -     Urine Culture; Future -     Urine cytology ancillary only([place]); Future  Dysuria -     Urinalysis, Routine w reflex microscopic; Future -     Urine Culture; Future -     Urine cytology ancillary only([place]); Future -     sulfamethoxazole-trimethoprim (BACTRIM DS) 800-160 MG tablet; Take 1 tablet by mouth 2 (two) times daily.  Vaginal discharge -     Urinalysis, Routine w reflex microscopic; Future -     Urine Culture; Future -     Urine cytology ancillary only([place]); Future  Recurrent UTI -     Ambulatory referral to  Urology   PLAN Bactrim on 7 day course. Ok to continue intermittent azo Pt caretaker will drop of urine sample today for testing as above. Will adjusttreatmnet as warranted Discussed nonpharm for infection prevention Patient encouraged to call clinic with any questions, comments, or concerns.  I discussed the assessment and treatment plan with the patient. The patient was provided an opportunity to ask questions and all were answered. The patient agreed with the plan and demonstrated an understanding of the instructions.   The patient was advised to call back or seek an in-person evaluation if the symptoms worsen or if the condition fails to improve as anticipated.  I provided 17 minutes of non-face-to-face time during this encounter.  [name], NP

## 2021-01-27 DIAGNOSIS — I495 Sick sinus syndrome: Secondary | ICD-10-CM | POA: Diagnosis not present

## 2021-01-27 DIAGNOSIS — I491 Atrial premature depolarization: Secondary | ICD-10-CM | POA: Diagnosis not present

## 2021-01-27 DIAGNOSIS — R55 Syncope and collapse: Secondary | ICD-10-CM | POA: Diagnosis not present

## 2021-01-27 DIAGNOSIS — I493 Ventricular premature depolarization: Secondary | ICD-10-CM | POA: Diagnosis not present

## 2021-01-27 DIAGNOSIS — I4891 Unspecified atrial fibrillation: Secondary | ICD-10-CM | POA: Diagnosis not present

## 2021-01-27 DIAGNOSIS — I471 Supraventricular tachycardia: Secondary | ICD-10-CM | POA: Diagnosis not present

## 2021-01-27 DIAGNOSIS — R001 Bradycardia, unspecified: Secondary | ICD-10-CM | POA: Diagnosis not present

## 2021-01-27 DIAGNOSIS — I950 Idiopathic hypotension: Secondary | ICD-10-CM | POA: Diagnosis not present

## 2021-01-28 DIAGNOSIS — R001 Bradycardia, unspecified: Secondary | ICD-10-CM | POA: Diagnosis not present

## 2021-01-31 ENCOUNTER — Telehealth: Payer: Self-pay

## 2021-01-31 NOTE — Progress Notes (Signed)
Chronic Care Management Pharmacy Assistant   Name: Carly [name]  MRN: [id number] DOB: [birth year]   Reason for Encounter: Monthly Medication Coordination Call     Recent office visits:  01/26/21 [name], NP - Family Medicine (Video Visit) - Urinary Frequency - Labs were done. Referral placed to Urology. Follow up no indicated.   01/14/21 [name], MD (PCP) - Family Medicine - Urinary Frequency - cephALEXin (KEFLEX) 500 MG capsule Take 1 capsule (500 mg total) by mouth 2 (two) times daily for 10 doses and busPIRone (BUSPAR) 10 MG tablet Take 1 tablet (10 mg total) by mouth 3 (three) times daily prescribed. Follow up not indicated.  Recent consult visits:  None noted.    Hospital visits: 11/22/20 - 11/25/20   Medication Reconciliation was completed by comparing discharge summary, patient's EMR and Pharmacy list, and upon discussion with patient.   Admitted to the hospital on 11/22/20 due to UTI. Discharge date was 11/25/20. Discharged from [facility].     New?Medications Started at [facility] Discharge:?? Started Cefdinir 300 MG capsule Take 1 capsule (300 mg total) by mouth 2 times daily for 4 days   Medication Changes at Hospital Discharge: No changes noted.   Medications Discontinued at Hospital Discharge: No medications were discontinued at discharge.   Medications that remain the same after Hospital Discharge:??  All other medications will remain the same.      Medications: Outpatient Encounter Medications as of 01/31/2021  Medication Sig   AMBULATORY NON FORMULARY MEDICATION Medication Name: Stairlift G20   busPIRone (BUSPAR) 10 MG tablet Take 1 tablet (10 mg total) by mouth 3 (three) times daily.   carbidopa-levodopa (SINEMET CR) 50-200 MG tablet Take 1 tablet by mouth at bedtime.   carbidopa-levodopa (SINEMET IR) 25-100 MG tablet TAKE TWO TABLETS BY MOUTH AT 6am & 10am and TAKE ONE TABLET BY MOUTH AT 2pm & 6pm   Cholecalciferol (VITAMIN D3)  100000 UNIT/GM POWD Take by mouth.   citalopram (CELEXA) 20 MG tablet Take 1 tablet (20 mg total) by mouth daily at 6 (six) AM.   Cyanocobalamin POWD Take by mouth.   ketorolac (ACULAR) 0.5 % ophthalmic solution    latanoprost (XALATAN) 0.005 % ophthalmic solution Place 1 drop into both eyes at bedtime.   meloxicam (MOBIC) 15 MG tablet Take 1 tablet (15 mg total) by mouth daily.   sulfamethoxazole-trimethoprim (BACTRIM DS) 800-160 MG tablet Take 1 tablet by mouth 2 (two) times daily.   Vitamin D, Ergocalciferol, (DRISDOL) 1.25 MG (50000 UNIT) CAPS capsule Take 1 capsule (50,000 Units total) by mouth every 7 (seven) days.   No facility-administered encounter medications on file as of 01/31/2021.    Reviewed chart for medication changes ahead of medication coordination call.  No OVs, Consults, or hospital visits since last care coordination call/Pharmacist visit. (If appropriate, list visit date, provider name)  No medication changes indicated OR if recent visit, treatment plan here.  BP Readings from Last 3 Encounters:  01/26/21 (!) 120/56  10/21/20 130/85  09/24/20 122/75    Lab Results  Component Value Date   HGBA1C 5.8 (H) 08/29/2013     Patient obtains medications through Vials  w/o Safety Caps 30 Days   Last adherence delivery included: (medication name and frequency)  carbidopa-levodopa (SINEMET IR) 25-100 MG tablet carbidopa-levodopa (SINEMET CR) 50-200 MG tablet Vitamin D, Ergocalciferol, (DRISDOL) 1.25 MG (50000 UNIT) CAPS capsule Vitamin B-12 10000 tablet Buspirone 7.5 mg    Patient is due for next adherence delivery  on: 02/10/21. Called patient and reviewed medications and coordinated delivery.  This delivery to include:  carbidopa-levodopa (SINEMET IR) 25-100 MG tablet carbidopa-levodopa (SINEMET CR) 50-200 MG tablet Vitamin D, Ergocalciferol, (DRISDOL) 1.25 MG (50000 UNIT) CAPS capsule Vitamin B-12 10000 tablet Buspirone 7.5 mg 1 tablet three times a day  (increased  from 7.5 mg on 01/14/21 but per caregiver they have decided not to continue at 10 mg and will continue at previous dosing) Meloxicam 115 mg 1 tablet daily as needed   Patient needs refills for : carbidopa-levodopa (SINEMET IR) 25-100 MG tablet and carbidopa-levodopa (SINEMET CR) 50-200 MG tablet and Meloxicam 15 mg 1 tablet daily prn for 30 day refills   Confirmed delivery date of 02/10/2021, advised patient that pharmacy will contact them the morning of delivery.   Care Gaps   AWV: done 03/03/20 (due 03/03/21) Colonoscopy: unknown DM Eye Exam: done 11/17/19 DM Foot Exam: N/A Microalbumin: N/A HbgAIC: N/A DEXA: last done 03/02/15 overdue (has been ordered) Mammogram: last done 11/13/17 overdue (has been ordered)   Star Rating Drugs: No Star Rating Drugs noted.  Future Appointments  Date Time Provider Department Center  02/18/2021  2:30 PM Sheliah Hatch, MD [facility]  04/05/2021 10:45 AM Tat, Octaviano Batty, DO [facility] None   Eugenie Filler, CCMA Clinical Pharmacist Assistant  [phone]  Time Spent: 48 minutes

## 2021-02-01 DIAGNOSIS — I739 Peripheral vascular disease, unspecified: Secondary | ICD-10-CM | POA: Diagnosis not present

## 2021-02-01 DIAGNOSIS — Z8249 Family history of ischemic heart disease and other diseases of the circulatory system: Secondary | ICD-10-CM | POA: Diagnosis not present

## 2021-02-01 DIAGNOSIS — K5900 Constipation, unspecified: Secondary | ICD-10-CM | POA: Diagnosis not present

## 2021-02-01 DIAGNOSIS — M1990 Unspecified osteoarthritis, unspecified site: Secondary | ICD-10-CM | POA: Diagnosis not present

## 2021-02-01 DIAGNOSIS — G20 Parkinson's disease: Secondary | ICD-10-CM | POA: Diagnosis not present

## 2021-02-01 DIAGNOSIS — H409 Unspecified glaucoma: Secondary | ICD-10-CM | POA: Diagnosis not present

## 2021-02-01 DIAGNOSIS — G8929 Other chronic pain: Secondary | ICD-10-CM | POA: Diagnosis not present

## 2021-02-01 DIAGNOSIS — Z008 Encounter for other general examination: Secondary | ICD-10-CM | POA: Diagnosis not present

## 2021-02-01 DIAGNOSIS — N390 Urinary tract infection, site not specified: Secondary | ICD-10-CM | POA: Diagnosis not present

## 2021-02-01 DIAGNOSIS — R269 Unspecified abnormalities of gait and mobility: Secondary | ICD-10-CM | POA: Diagnosis not present

## 2021-02-01 DIAGNOSIS — M545 Low back pain, unspecified: Secondary | ICD-10-CM | POA: Diagnosis not present

## 2021-02-01 DIAGNOSIS — R69 Illness, unspecified: Secondary | ICD-10-CM | POA: Diagnosis not present

## 2021-02-01 DIAGNOSIS — M8580 Other specified disorders of bone density and structure, unspecified site: Secondary | ICD-10-CM | POA: Diagnosis not present

## 2021-02-03 ENCOUNTER — Other Ambulatory Visit: Payer: Self-pay | Admitting: Neurology

## 2021-02-03 DIAGNOSIS — F0670 Mild neurocognitive disorder due to known physiological condition without behavioral disturbance: Secondary | ICD-10-CM

## 2021-02-03 DIAGNOSIS — G20 Parkinson's disease: Secondary | ICD-10-CM

## 2021-02-04 DIAGNOSIS — I950 Idiopathic hypotension: Secondary | ICD-10-CM | POA: Diagnosis not present

## 2021-02-04 DIAGNOSIS — I455 Other specified heart block: Secondary | ICD-10-CM | POA: Diagnosis not present

## 2021-02-04 DIAGNOSIS — I491 Atrial premature depolarization: Secondary | ICD-10-CM | POA: Diagnosis not present

## 2021-02-04 DIAGNOSIS — I493 Ventricular premature depolarization: Secondary | ICD-10-CM | POA: Diagnosis not present

## 2021-02-04 DIAGNOSIS — R42 Dizziness and giddiness: Secondary | ICD-10-CM | POA: Diagnosis not present

## 2021-02-04 DIAGNOSIS — I471 Supraventricular tachycardia: Secondary | ICD-10-CM | POA: Diagnosis not present

## 2021-02-04 DIAGNOSIS — R55 Syncope and collapse: Secondary | ICD-10-CM | POA: Diagnosis not present

## 2021-02-07 ENCOUNTER — Telehealth: Payer: Self-pay | Admitting: Neurology

## 2021-02-07 NOTE — Telephone Encounter (Signed)
POA called in, would like to know if Carly [name] will do a VV before her jan appt. Its very hard to get her here for an appt. They have noticed many changes since her last appt in June, and they feel they need to speak with Carly [name]. They are very concerned

## 2021-02-07 NOTE — Telephone Encounter (Signed)
Called POA [name] back to gather a little more information on what the symptoms are for the patient at this time

## 2021-02-08 NOTE — Telephone Encounter (Signed)
Patient's sister returned call to [facility].

## 2021-02-08 NOTE — Telephone Encounter (Signed)
Pt sister called back, returning a call to [place]. She asked if you could  call [phone] since they are playing phone tag

## 2021-02-08 NOTE — Telephone Encounter (Signed)
Called POA [name] back  for second time to gather a little more information on what the symptoms are for the patient at this time

## 2021-02-09 NOTE — Telephone Encounter (Signed)
Spoke with Carly [name] pt sister she stated that 2 months ago pt was D/c from [facility] and has had a great change. She has to have a pacemaker, they stated she has neurologic orthostatic BP, frequent UTIs. PT Sister is asking for a VV to start pt on  BP medication for the neurologic orthostatic BP and with her living at home she stated it dangerous for her BP to be dropping in the 80 like it does when she stands. Pt is off her anxiety meds, they have increased her hydration and BP still drops.  They are asking if Dr Tat will please do a VV because they are scared taken pt out she may fall going from house to car and car to office.

## 2021-02-09 NOTE — Telephone Encounter (Signed)
Spoke with pt sister informed her hat Dr Tat is sorry sorry Mrs Hibbard is having so much difficulty.  She would need to unfortunately come in for that one b/c we would need to check BP's.  However, she did just see her cardiologist 5 days ago, and he/she could manage this.  Most of our cardiologists manage Neurogenic Orthostatic Hypotension, as opposed to Dr Tat.  IDr Tat recommends them call cardiology and ask about that, along with PPM recommendations. Pt sister stated that cardiology gave them the run around but she will call them back and would like to added to the wait list.

## 2021-02-15 ENCOUNTER — Telehealth (INDEPENDENT_AMBULATORY_CARE_PROVIDER_SITE_OTHER): Payer: Medicare HMO | Admitting: Family Medicine

## 2021-02-15 ENCOUNTER — Encounter: Payer: Self-pay | Admitting: Family Medicine

## 2021-02-15 DIAGNOSIS — N390 Urinary tract infection, site not specified: Secondary | ICD-10-CM

## 2021-02-15 MED ORDER — NITROFURANTOIN MONOHYD MACRO 100 MG PO CAPS: ORAL_CAPSULE | ORAL | 0 refills | Status: AC

## 2021-02-15 NOTE — Progress Notes (Signed)
Virtual Visit via Video   I connected with patient on 02/15/21 at 11:30 AM EST by a video enabled telemedicine application and verified that I am speaking with the correct person using two identifiers.  Location patient: Home Location provider: [place] [facility], Office Persons participating in the virtual visit: Patient, Provider, CMA Tresa Endo C)  I discussed the limitations of evaluation and management by telemedicine and the availability of in person appointments. The patient expressed understanding and agreed to proceed.  Subjective:   HPI:  Urinary Frequency- pt was seen 10/26 for similar and started on Bactrim x7 days.  This was after being seen 10/14 and treated w/ Keflex.  She was referred to Urology for recurrent UTIs and has an appt upcoming at [facility] in December.  Pt reports she developed sxs ~1 week after finishing Bactrim.  + odor, cloudy, frequency.  Pt doesn't have caregivers present on Saturday or Sunday and she is not wiping correctly when no one is doing it for her.  ROS:   See pertinent positives and negatives per HPI.  Patient Active Problem List   Diagnosis Date Noted   UTI (urinary tract infection) 11/25/2020   Cardiac arrhythmia 11/23/2020   Other dysphagia 10/21/2020   Obesity (BMI 30-39.9) 03/03/2020   Mild neurocognitive disorder due to Parkinson's disease 09/30/2019   Color vision defect 05/21/2019   Decreased visual acuity 05/21/2019   Vitamin D deficiency 11/07/2017   Primary osteoarthritis of left knee 05/21/2017   Degenerative arthritis of left knee 05/18/2017   Anterior cervical lymphadenopathy 05/14/2015   Osteopenia 02/15/2015   Physical exam 02/09/2014   B12 deficiency 01/01/2014   Achilles tendinitis 01/31/2010   Parkinson's disease 07/08/2008   Tremor, right hand 04/07/2008   Anxiety state 01/31/2007   HTN (hypertension) 01/31/2007    Social History   Tobacco Use   Smoking status: Never   Smokeless tobacco: Never   Substance Use Topics   Alcohol use: No    Current Outpatient Medications:    AMBULATORY NON FORMULARY MEDICATION, Medication Name: Stairlift G20, Disp: 1 Device, Rfl: 0   busPIRone (BUSPAR) 10 MG tablet, Take 1 tablet (10 mg total) by mouth 3 (three) times daily. (Patient taking differently: Take by mouth. 7.5 mg two times daily), Disp: 90 tablet, Rfl: 3   carbidopa-levodopa (SINEMET CR) 50-200 MG tablet, TAKE ONE TABLET BY MOUTH EVERYDAY AT BEDTIME, Disp: 90 tablet, Rfl: 0   carbidopa-levodopa (SINEMET IR) 25-100 MG tablet, TAKE TWO TABLETS BY MOUTH AT 6am & 10am and TAKE ONE TABLET BY MOUTH AT 2pm & 6pm, Disp: 540 tablet, Rfl: 0   Cholecalciferol (VITAMIN D3) 100000 UNIT/GM POWD, Take by mouth., Disp: , Rfl:    citalopram (CELEXA) 20 MG tablet, Take 1 tablet (20 mg total) by mouth daily at 6 (six) AM., Disp: 90 tablet, Rfl: 0   ketorolac (ACULAR) 0.5 % ophthalmic solution, , Disp: , Rfl:    latanoprost (XALATAN) 0.005 % ophthalmic solution, Place 1 drop into both eyes at bedtime., Disp: , Rfl:    Vitamin D, Ergocalciferol, (DRISDOL) 1.25 MG (50000 UNIT) CAPS capsule, Take 1 capsule (50,000 Units total) by mouth every 7 (seven) days., Disp: 12 capsule, Rfl: 0   Cyanocobalamin POWD, Take by mouth. (Patient not taking: Reported on 02/15/2021), Disp: , Rfl:    meloxicam (MOBIC) 15 MG tablet, Take 1 tablet (15 mg total) by mouth daily. (Patient not taking: Reported on 02/15/2021), Disp: 30 tablet, Rfl: 0   sulfamethoxazole-trimethoprim (BACTRIM DS) 800-160 MG tablet, Take  1 tablet by mouth 2 (two) times daily. (Patient not taking: Reported on 02/15/2021), Disp: 14 tablet, Rfl: 0  Allergies  Allergen Reactions   Codeine Nausea Only and Other (See Comments)    hallucinations    Objective:   There were no vitals taken for this visit.  AAOx2, NAD NCAT, EOMI No obvious CN deficits Coloring WNL Pt is able to speak clearly, coherently without shortness of breath or increased work of  breathing.  Thought process is linear.  Mood is appropriate.   Assessment and Plan:   Recurrent UTI- ongoing issue for pt.  As soon as she completes a round of abx, she typically develops recurrent sxs within the week.  Today I found that this is bc she doesn't have a caregiver with her on Friday nights, Saturdays, and Sundays.  If she does not have a caregiver to assist her, she is not wiping correctly and will wipe back to front.  Caregiver states she will bring a urine today for [place] to send for culture to make sure we are treating correctly.  In the meantime, will start treatment dose Macrobid x7 days and then do prophylaxis dosing until upcoming urology appt.  Pt and caregiver expressed understanding.   [name], MD 02/15/2021

## 2021-02-18 ENCOUNTER — Ambulatory Visit: Payer: Medicare HMO | Admitting: Family Medicine

## 2021-02-23 ENCOUNTER — Telehealth: Payer: Self-pay | Admitting: Neurology

## 2021-02-23 NOTE — Telephone Encounter (Signed)
Does pt need to continue mirapex. Doesn't look like she has been taking it.

## 2021-02-23 NOTE — Telephone Encounter (Signed)
Called patients sister back and looked at last Encounter stating she was taken off of the Mirapex due to change in memory. Patients sister understood and was just checking to make sure she had no further questions at this time

## 2021-02-25 DIAGNOSIS — I951 Orthostatic hypotension: Secondary | ICD-10-CM | POA: Diagnosis not present

## 2021-02-25 DIAGNOSIS — G20 Parkinson's disease: Secondary | ICD-10-CM | POA: Diagnosis not present

## 2021-02-25 DIAGNOSIS — I495 Sick sinus syndrome: Secondary | ICD-10-CM | POA: Diagnosis not present

## 2021-02-25 DIAGNOSIS — Z950 Presence of cardiac pacemaker: Secondary | ICD-10-CM | POA: Diagnosis not present

## 2021-02-25 DIAGNOSIS — I480 Paroxysmal atrial fibrillation: Secondary | ICD-10-CM | POA: Diagnosis not present

## 2021-02-26 DIAGNOSIS — I517 Cardiomegaly: Secondary | ICD-10-CM | POA: Diagnosis not present

## 2021-02-26 DIAGNOSIS — I480 Paroxysmal atrial fibrillation: Secondary | ICD-10-CM | POA: Diagnosis not present

## 2021-02-26 DIAGNOSIS — Z950 Presence of cardiac pacemaker: Secondary | ICD-10-CM | POA: Diagnosis not present

## 2021-02-26 DIAGNOSIS — G20 Parkinson's disease: Secondary | ICD-10-CM | POA: Diagnosis not present

## 2021-02-26 DIAGNOSIS — I495 Sick sinus syndrome: Secondary | ICD-10-CM | POA: Diagnosis not present

## 2021-02-26 DIAGNOSIS — I951 Orthostatic hypotension: Secondary | ICD-10-CM | POA: Diagnosis not present

## 2021-03-02 ENCOUNTER — Telehealth: Payer: Self-pay | Admitting: *Deleted

## 2021-03-02 DIAGNOSIS — R1319 Other dysphagia: Secondary | ICD-10-CM | POA: Diagnosis not present

## 2021-03-02 DIAGNOSIS — F419 Anxiety disorder, unspecified: Secondary | ICD-10-CM | POA: Diagnosis not present

## 2021-03-02 DIAGNOSIS — I480 Paroxysmal atrial fibrillation: Secondary | ICD-10-CM | POA: Diagnosis not present

## 2021-03-02 DIAGNOSIS — R69 Illness, unspecified: Secondary | ICD-10-CM | POA: Diagnosis not present

## 2021-03-02 DIAGNOSIS — Z87440 Personal history of urinary (tract) infections: Secondary | ICD-10-CM | POA: Diagnosis not present

## 2021-03-02 DIAGNOSIS — G20 Parkinson's disease: Secondary | ICD-10-CM | POA: Diagnosis not present

## 2021-03-02 DIAGNOSIS — Z950 Presence of cardiac pacemaker: Secondary | ICD-10-CM | POA: Diagnosis not present

## 2021-03-02 DIAGNOSIS — R001 Bradycardia, unspecified: Secondary | ICD-10-CM | POA: Diagnosis not present

## 2021-03-02 DIAGNOSIS — H25013 Cortical age-related cataract, bilateral: Secondary | ICD-10-CM | POA: Diagnosis not present

## 2021-03-02 DIAGNOSIS — F0670 Mild neurocognitive disorder due to known physiological condition without behavioral disturbance: Secondary | ICD-10-CM | POA: Diagnosis not present

## 2021-03-02 DIAGNOSIS — Z48812 Encounter for surgical aftercare following surgery on the circulatory system: Secondary | ICD-10-CM | POA: Diagnosis not present

## 2021-03-02 DIAGNOSIS — H401134 Primary open-angle glaucoma, bilateral, indeterminate stage: Secondary | ICD-10-CM | POA: Diagnosis not present

## 2021-03-02 DIAGNOSIS — I951 Orthostatic hypotension: Secondary | ICD-10-CM | POA: Diagnosis not present

## 2021-03-02 DIAGNOSIS — G908 Other disorders of autonomic nervous system: Secondary | ICD-10-CM | POA: Diagnosis not present

## 2021-03-02 DIAGNOSIS — I455 Other specified heart block: Secondary | ICD-10-CM | POA: Diagnosis not present

## 2021-03-02 DIAGNOSIS — I471 Supraventricular tachycardia: Secondary | ICD-10-CM | POA: Diagnosis not present

## 2021-03-02 NOTE — Telephone Encounter (Signed)
Home Health Verbal Orders  Agency:  [facility]  Caller: Environmental manager and title) Hilda Call back #: [phone] Fax #: [phone]    Requesting OT/ PT/ Skilled nursing/ Social Work/ Speech:  Physical Therapy at home for strengthening   Reason for Request:  Recent hospitalization  Frequency:  1x a week for 9 weeks   HH needs F2F w/in last 30 days

## 2021-03-03 NOTE — Progress Notes (Signed)
Assessment/Plan:   1.  Parkinsons Disease  -Continue carbidopa/levodopa 25/100, 2 tablets at 6 AM/2 tablets at 10 AM/1 tablet at 2 PM/1 tablet at 6 PM  -Continue carbidopa/levodopa 50/200 at bedtime   2.  Probable PDD  -Had neurocognitive testing in June, 2021 that just demonstrated MCI but likely has PDD now  3.  B12 deficiency  -On oral supplementation.  4.  GAD  -Following with primary care.  On citalopram and BuSpar.  5.  Syncope  -Was probably duly related to SVT and Neurogenic Orthostatic Hypotension.  She just had a permanent pacemaker placed November 25 for the SVT.  -add midodrine, 2.5 mg tid with meals.  Raise HOB.  Discussed concept of supine HTN.  Discussed importance of raising head of bed.  Discussed concept of permissive hypertension, but discussed upper limits for that as well.  -Patient needs to increase water intake.  Discussed this.   Subjective:   Carly [name] was seen today in follow up for Parkinsons disease.  My previous records were reviewed prior to todays visit as well as outside records available to me.  Pt with caregiver/CNA who supplements hx.  Sister on phone and supplements hx.  patient's sister called me about a month ago stating that patient's blood pressure was dropping.  They wanted to do a video visit with me to discuss that, but told her at that point in time that she would need to come in.  However, I told them that I also noticed in her chart that she had just seen cardiology 5 days before calling me and many times cardiologists manage orthostatic hypotension.  In addition, the patient was in need of a pacemaker, which certainly could cause syncope/near syncope.  She did have her pacemaker implanted after that phone call.  It was implanted on November 25.  Cardiology records indicate that prior to the pacemaker, she wore a Zio patch and had pauses up to 4 seconds and a PVC burden of about 10% in addition to paroxysmal SVT.  SVT episodes were  up to 30 minutes.  Caregiver states that still having spells with BP dropping but better.  BP still running in the low 100's.  Not drinking a lot of water.  Caregiver notes that since July more confusion in the AM.  Occ hallucination - not daily.  Worse in the evening.  She has caregivers.  The one works 90 hours/week (stays in the AM until 1pm and then agency comes in 1-5pm and caregiver back in over night; 8am-5pm on sat/sun).  On weekends, meds are usually given correctly but caregiver thinks about 20% of time, its not given right.  She does have alarm.    Current prescribed movement disorder medications: Carbidopa/levodopa 25/100, 2 tablets at 6 AM/2 tablets at 10 AM/1 tablet at 2 PM/1 tablet at 6 PM  Carbidopa/levodopa 50/200 at bedtime B12 supplementation  (Citalopram, 20 mg daily and BuSpar 7.5 mg daily -both by primary care)   PREVIOUS MEDICATIONS: Sinemet, Sinemet CR, and Mirapex (stopped because of memory change)  ALLERGIES:   Allergies  Allergen Reactions   Codeine Nausea Only and Other (See Comments)    hallucinations    CURRENT MEDICATIONS:  Outpatient Encounter Medications as of 03/07/2021  Medication Sig   busPIRone (BUSPAR) 10 MG tablet Take 1 tablet (10 mg total) by mouth 3 (three) times daily. (Patient taking differently: Take 7.5 mg by mouth 2 (two) times daily. 7.5 mg two times daily)   carbidopa-levodopa (SINEMET  CR) 50-200 MG tablet TAKE ONE TABLET BY MOUTH EVERYDAY AT BEDTIME   carbidopa-levodopa (SINEMET IR) 25-100 MG tablet TAKE TWO TABLETS BY MOUTH AT 6am & 10am and TAKE ONE TABLET BY MOUTH AT 2pm & 6pm   Cholecalciferol (VITAMIN D3) 100000 UNIT/GM POWD Take by mouth.   ketorolac (ACULAR) 0.5 % ophthalmic solution    latanoprost (XALATAN) 0.005 % ophthalmic solution Place 1 drop into both eyes at bedtime.   nitrofurantoin, macrocrystal-monohydrate, (MACROBID) 100 MG capsule Take 1 capsule (100 mg total) by mouth 2 (two) times daily for 7 days, THEN 1 capsule (100 mg  total) at bedtime for 21 days.   Vitamin D, Ergocalciferol, (DRISDOL) 1.25 MG (50000 UNIT) CAPS capsule Take 1 capsule (50,000 Units total) by mouth every 7 (seven) days.   AMBULATORY NON FORMULARY MEDICATION Medication Name: Stairlift G20   [DISCONTINUED] citalopram (CELEXA) 20 MG tablet Take 1 tablet (20 mg total) by mouth daily at 6 (six) AM. (Patient not taking: Reported on 03/07/2021)   No facility-administered encounter medications on file as of 03/07/2021.    Objective:   PHYSICAL EXAMINATION:    VITALS:   Vitals:   03/07/21 1057 03/07/21 1058 03/07/21 1059  SpO2: 98% 94% 94%  Weight: 167 lb (75.8 kg) 167 lb (75.8 kg) 167 lb (75.8 kg)  Height: 5\' 5"  (1.651 m)      Wt Readings from Last 3 Encounters:  03/07/21 167 lb (75.8 kg)  09/24/20 167 lb (75.8 kg)  09/15/20 167 lb (75.8 kg)   Orthostatic VS for the past 72 hrs (Last 3 readings):  Orthostatic BP Patient Position BP Location Orthostatic Pulse  03/07/21 1059 92/62 Standing Right Arm 84  03/07/21 1058 108/72 Supine Right Arm 84  03/07/21 1057 128/78 Sitting Right Arm (!) 43      GEN:  The patient appears stated age and is in NAD. HEENT:  Normocephalic, atraumatic.  The mucous membranes are moist. The superficial temporal arteries are without ropiness or tenderness. CV:  brady.  regular Lungs:  CTAB Neck/HEME:  There are no carotid bruits bilaterally.  Neurological examination:  Orientation: The patient is alert and oriented x3.  She remembers this examiner has twins and asks me the age.  She does look to her caregiver for more sensitive aspects of history. Cranial nerves: There is good facial symmetry with mild facial hypomimia. The speech is fluent and clear. Soft palate rises symmetrically and there is no tongue deviation. Hearing is intact to conversational tone. Sensation: Sensation is intact to light touch throughout Motor: Strength is at least antigravity x4.  Movement examination: Tone: There is mild  increased tone in the RUE Abnormal movements:none today (caregiver hasn't noted that) Coordination:  There is mild decremation with RAM's, with any form of RAMS, including alternating supination and pronation of the forearm, hand opening and closing, finger taps, heel taps and toe taps bilaterally Gait and Station: The patient has mild difficulty arising out of a deep-seated chair without the use of the hands. The patient's stride length is good with dragging of the leg on the L.   L foot is a bit dystonic and almost trips the R foot.   I have reviewed and interpreted the following labs independently    Chemistry      Component Value Date/Time   NA 137 03/03/2020 1035   K 4.8 03/03/2020 1035   CL 102 03/03/2020 1035   CO2 30 03/03/2020 1035   BUN 25 (H) 03/03/2020 1035   CREATININE 1.24 (  H) 03/03/2020 1035      Component Value Date/Time   CALCIUM 9.2 03/03/2020 1035   ALKPHOS 94 03/03/2020 1035   AST 16 03/03/2020 1035   ALT 5 03/03/2020 1035   BILITOT 0.8 03/03/2020 1035       Lab Results  Component Value Date   WBC 8.5 03/03/2020   HGB 12.9 03/03/2020   HCT 39.4 03/03/2020   MCV 91.2 03/03/2020   PLT 146.0 (L) 03/03/2020    Lab Results  Component Value Date   TSH 0.96 03/03/2020     Total time spent on today's visit was 30 minutes, including both face-to-face time and nonface-to-face time.  Time included that spent on review of records (prior notes available to me/labs/imaging if pertinent), discussing treatment and goals, answering patient's questions and coordinating care.  Cc:  Sheliah Hatch, MD

## 2021-03-03 NOTE — Telephone Encounter (Signed)
Ok to proceed w/ verbal orders. 

## 2021-03-03 NOTE — Telephone Encounter (Signed)
Spoke to [place] and provided verbal orders

## 2021-03-04 ENCOUNTER — Telehealth: Payer: Self-pay | Admitting: Pharmacist

## 2021-03-04 ENCOUNTER — Other Ambulatory Visit: Payer: Self-pay | Admitting: Family Medicine

## 2021-03-04 DIAGNOSIS — M1712 Unilateral primary osteoarthritis, left knee: Secondary | ICD-10-CM

## 2021-03-04 NOTE — Progress Notes (Addendum)
Chronic Care Management Pharmacy Assistant   Name: Carly [name]  MRN: [id number] DOB: 1945/04/06   Reason for Encounter: Monthly Medication Coordination Call   Medications: Outpatient Encounter Medications as of 03/04/2021  Medication Sig   AMBULATORY NON FORMULARY MEDICATION Medication Name: Stairlift G20   busPIRone (BUSPAR) 10 MG tablet Take 1 tablet (10 mg total) by mouth 3 (three) times daily. (Patient taking differently: Take by mouth. 7.5 mg two times daily)   carbidopa-levodopa (SINEMET CR) 50-200 MG tablet TAKE ONE TABLET BY MOUTH EVERYDAY AT BEDTIME   carbidopa-levodopa (SINEMET IR) 25-100 MG tablet TAKE TWO TABLETS BY MOUTH AT 6am & 10am and TAKE ONE TABLET BY MOUTH AT 2pm & 6pm   Cholecalciferol (VITAMIN D3) 100000 UNIT/GM POWD Take by mouth.   citalopram (CELEXA) 20 MG tablet Take 1 tablet (20 mg total) by mouth daily at 6 (six) AM.   ketorolac (ACULAR) 0.5 % ophthalmic solution    latanoprost (XALATAN) 0.005 % ophthalmic solution Place 1 drop into both eyes at bedtime.   nitrofurantoin, macrocrystal-monohydrate, (MACROBID) 100 MG capsule Take 1 capsule (100 mg total) by mouth 2 (two) times daily for 7 days, THEN 1 capsule (100 mg total) at bedtime for 21 days.   Vitamin D, Ergocalciferol, (DRISDOL) 1.25 MG (50000 UNIT) CAPS capsule Take 1 capsule (50,000 Units total) by mouth every 7 (seven) days.   No facility-administered encounter medications on file as of 03/04/2021.    Reviewed chart for medication changes ahead of medication coordination call.  No OVs, Consults, or hospital visits since last care coordination call/Pharmacist visit. (If appropriate, list visit date, provider name)  No medication changes indicated OR if recent visit, treatment plan here.  BP Readings from Last 3 Encounters:  01/26/21 (!) 120/56  10/21/20 130/85  09/24/20 122/75    Lab Results  Component Value Date   HGBA1C 5.8 (H) 08/29/2013     Patient obtains medications through  Vials  30 Days   Last adherence delivery included: (medication name and frequency)  carbidopa-levodopa (SINEMET IR) 25-100 MG tablet carbidopa-levodopa (SINEMET CR) 50-200 MG tablet Vitamin D, Ergocalciferol, (DRISDOL) 1.25 MG (50000 UNIT) CAPS capsule Vitamin B-12 10000 tablet Buspirone 7.5 mg 1 tablet three times a day (increased  from 7.5 mg on 01/14/21 but per caregiver they have decided not to continue at 10 mg and will continue at previous dosing) Meloxicam 15 mg 1 tablet daily as needed    Patient is due for next adherence delivery on: 03/11/21. Called patient and reviewed medications and coordinated delivery.  This delivery to include:  carbidopa-levodopa (SINEMET IR) 25-100 MG tablet carbidopa-levodopa (SINEMET CR) 50-200 MG tablet Vitamin D, Ergocalciferol, (DRISDOL) 1.25 MG (50000 UNIT) CAPS capsule Vitamin B-12 10000 tablet Buspirone 7.5 mg 1 tablet three times a day (increased  from 7.5 mg on 01/14/21 but per caregiver they have decided not to continue at 10 mg and will continue at previous dosing) Meloxicam 15 mg 1 tablet daily as needed    Patient needs refills for Meloxicam 15 mg 1 at bedtime prn 30 day supply  Confirmed delivery date of 03/11/21, advised patient that pharmacy will contact them the morning of delivery.  Star Rating Drugs: No star rating drugs noted.  Future Appointments  Date Time Provider Department Center  03/07/2021 11:15 AM Tat, Octaviano Batty, DO [facility] None  03/10/2021 11:15 AM [facility]     Eugenie Filler, CCMA Clinical Pharmacist Assistant  [phone]  10 minutes spent in review,  coordination, and documentation.  Reviewed by: Willa Frater, PharmD Clinical Pharmacist [phone]

## 2021-03-07 ENCOUNTER — Encounter: Payer: Self-pay | Admitting: Neurology

## 2021-03-07 ENCOUNTER — Ambulatory Visit: Payer: Medicare HMO | Admitting: Neurology

## 2021-03-07 ENCOUNTER — Other Ambulatory Visit: Payer: Self-pay

## 2021-03-07 VITALS — Ht 65.0 in | Wt 167.0 lb

## 2021-03-07 DIAGNOSIS — G903 Multi-system degeneration of the autonomic nervous system: Secondary | ICD-10-CM

## 2021-03-07 DIAGNOSIS — G20 Parkinson's disease: Secondary | ICD-10-CM | POA: Diagnosis not present

## 2021-03-07 DIAGNOSIS — F0280 Dementia in other diseases classified elsewhere without behavioral disturbance: Secondary | ICD-10-CM | POA: Diagnosis not present

## 2021-03-07 DIAGNOSIS — R69 Illness, unspecified: Secondary | ICD-10-CM | POA: Diagnosis not present

## 2021-03-07 MED ORDER — MIDODRINE HCL 2.5 MG PO TABS: 2.5000 mg | ORAL_TABLET | Freq: Three times a day (TID) | ORAL | 0 refills | Status: DC

## 2021-03-07 MED ORDER — MIDODRINE HCL 2.5 MG PO TABS: 2.5000 mg | ORAL_TABLET | Freq: Three times a day (TID) | ORAL | 1 refills | Status: DC

## 2021-03-07 NOTE — Patient Instructions (Signed)
Start midodrine 2.5 mg three times per day with meals  I want to see 60 oz of water per day.

## 2021-03-08 DIAGNOSIS — I455 Other specified heart block: Secondary | ICD-10-CM | POA: Diagnosis not present

## 2021-03-08 DIAGNOSIS — I493 Ventricular premature depolarization: Secondary | ICD-10-CM | POA: Diagnosis not present

## 2021-03-08 DIAGNOSIS — I480 Paroxysmal atrial fibrillation: Secondary | ICD-10-CM | POA: Diagnosis not present

## 2021-03-08 DIAGNOSIS — Z45018 Encounter for adjustment and management of other part of cardiac pacemaker: Secondary | ICD-10-CM | POA: Diagnosis not present

## 2021-03-09 DIAGNOSIS — N390 Urinary tract infection, site not specified: Secondary | ICD-10-CM | POA: Diagnosis not present

## 2021-03-09 DIAGNOSIS — R351 Nocturia: Secondary | ICD-10-CM | POA: Diagnosis not present

## 2021-03-09 DIAGNOSIS — R350 Frequency of micturition: Secondary | ICD-10-CM | POA: Diagnosis not present

## 2021-03-10 ENCOUNTER — Ambulatory Visit (INDEPENDENT_AMBULATORY_CARE_PROVIDER_SITE_OTHER): Payer: Medicare HMO

## 2021-03-10 DIAGNOSIS — Z0000 Encounter for general adult medical examination without abnormal findings: Secondary | ICD-10-CM | POA: Diagnosis not present

## 2021-03-10 NOTE — Progress Notes (Signed)
Subjective:   Carly [name] is a 75 y.o. female who presents for Medicare Annual (Subsequent) preventive examination.  I connected with Theodoro Grist today by telephone and verified that I am speaking with the correct person using two identifiers. Location patient: home Location provider: work Persons participating in the virtual visit: patient, provider.   I discussed the limitations, risks, security and privacy concerns of performing an evaluation and management service by telephone and the availability of in person appointments. I also discussed with the patient that there may be a patient responsible charge related to this service. The patient expressed understanding and verbally consented to this telephonic visit.    Interactive audio and video telecommunications were attempted between this provider and patient, however failed, due to patient having technical difficulties OR patient did not have access to video capability.  We continued and completed visit with audio only.    Review of Systems     Cardiac Risk Factors include: advanced age (>21men, >73 women);hypertension;dyslipidemia     Objective:    Today's Vitals   There is no height or weight on file to calculate BMI.  Advanced Directives 03/10/2021 03/07/2021 09/15/2020 05/14/2020 02/20/2020 02/16/2020 11/27/2019  Does Patient Have a Medical Advance Directive? Yes Yes Yes Yes Yes Yes Yes  Type of Estate agent of [place];Living will Living will Healthcare Power of [place];Living will;Out of facility DNR (pink MOST or yellow form) Healthcare Power of [place];Living will Healthcare Power of [place];Living will Healthcare Power of [place];Living will Healthcare Power of [place];Living will  Does patient want to make changes to medical advance directive? - - - - - - -  Copy of Healthcare Power of Attorney in Chart? No - copy requested - - - - No - copy requested -    Current Medications  (verified) Outpatient Encounter Medications as of 03/10/2021  Medication Sig   AMBULATORY NON FORMULARY MEDICATION Medication Name: Stairlift G20   busPIRone (BUSPAR) 10 MG tablet Take 1 tablet (10 mg total) by mouth 3 (three) times daily. (Patient taking differently: Take 7.5 mg by mouth 2 (two) times daily. 7.5 mg two times daily)   carbidopa-levodopa (SINEMET CR) 50-200 MG tablet TAKE ONE TABLET BY MOUTH EVERYDAY AT BEDTIME   carbidopa-levodopa (SINEMET IR) 25-100 MG tablet TAKE TWO TABLETS BY MOUTH AT 6am & 10am and TAKE ONE TABLET BY MOUTH AT 2pm & 6pm   Cholecalciferol (VITAMIN D3) 100000 UNIT/GM POWD Take by mouth.   ketorolac (ACULAR) 0.5 % ophthalmic solution    midodrine (PROAMATINE) 2.5 MG tablet Take 1 tablet (2.5 mg total) by mouth 3 (three) times daily with meals.   midodrine (PROAMATINE) 2.5 MG tablet Take 1 tablet (2.5 mg total) by mouth 3 (three) times daily with meals.   nitrofurantoin, macrocrystal-monohydrate, (MACROBID) 100 MG capsule Take 1 capsule (100 mg total) by mouth 2 (two) times daily for 7 days, THEN 1 capsule (100 mg total) at bedtime for 21 days.   Vitamin D, Ergocalciferol, (DRISDOL) 1.25 MG (50000 UNIT) CAPS capsule Take 1 capsule (50,000 Units total) by mouth every 7 (seven) days.   flecainide (TAMBOCOR) 50 MG tablet Take 50 mg by mouth 2 (two) times daily.   latanoprost (XALATAN) 0.005 % ophthalmic solution Place 1 drop into both eyes at bedtime. (Patient not taking: Reported on 03/10/2021)   No facility-administered encounter medications on file as of 03/10/2021.    Allergies (verified) Codeine   History: Past Medical History:  Diagnosis Date   Anemia  Anterior cervical lymphadenopathy 05/14/2015   B12 deficiency 01/01/2014   Bronchitis 04/12/2011   Color vision defect 05/21/2019   Decreased visual acuity 05/21/2019   Degenerative arthritis of left knee 05/18/2017   DVT of lower extremity, bilateral    years ago    Generalized anxiety disorder with  panic attacks 01/31/2007   HTN (hypertension) 01/31/2007   Hypertension    Mild neurocognitive disorder due to Parkinson's disease 09/30/2019   Osteopenia 02/15/2015   Parkinson's disease    Primary osteoarthritis of left knee 05/21/2017   Vitamin D deficiency    Past Surgical History:  Procedure Laterality Date   BREAST BIOPSY     DILATION AND CURETTAGE OF UTERUS     EYE SURGERY Right 05/21/2018   KNEE SURGERY Left 01/2017   pace maker     REFRACTIVE SURGERY Bilateral    TOTAL KNEE ARTHROPLASTY Left 05/21/2017   Procedure: TOTAL KNEE ARTHROPLASTY;  Surgeon: Gean Birchwood, MD;  Location: [facility] OR;  Service: Orthopedics;  Laterality: Left;   wrist sx     Family History  Problem Relation Age of Onset   Heart attack Father    Heart failure Mother    Cancer Brother        brain   Cancer Brother        brain   Healthy Sister    Stroke Sister    Arthritis Son    Social History   Socioeconomic History   Marital status: Significant Other    Spouse name: Not on file   Number of children: 1   Years of education: 12   Highest education level: High school graduate  Occupational History   Occupation: Retired  Tobacco Use   Smoking status: Never   Smokeless tobacco: Never  Building services engineer Use: Never used  Substance and Sexual Activity   Alcohol use: No   Drug use: No   Sexual activity: Not on file  Other Topics Concern   Not on file  Social History Narrative   Patient lives at home with partner Demetrios Loll.    Patient has one adult child.    Patient has 14 years of education.    Patient does not work.   Caffeine consumption is 1 cup daily    Social Determinants of Health   Financial Resource Strain: Low Risk    Difficulty of Paying Living Expenses: Not hard at all  Food Insecurity: No Food Insecurity   Worried About Programme researcher, broadcasting/film/video in the Last Year: Never true   Ran Out of Food in the Last Year: Never true  Transportation Needs: No Transportation Needs   Lack of  Transportation (Medical): No   Lack of Transportation (Non-Medical): No  Physical Activity: Insufficiently Active   Days of Exercise per Week: 1 day   Minutes of Exercise per Session: 30 min  Stress: No Stress Concern Present   Feeling of Stress : Not at all  Social Connections: Moderately Isolated   Frequency of Communication with Friends and Family: Twice a week   Frequency of Social Gatherings with Friends and Family: Twice a week   Attends Religious Services: Never   Database administrator or Organizations: No   Attends Engineer, structural: Never   Marital Status: Living with partner    Tobacco Counseling Counseling given: Not Answered   Clinical Intake:  Pre-visit preparation completed: Yes  Pain : No/denies pain     Nutritional Risks: Unintentional weight loss (Visual merchandiser  surgery) Diabetes: No  How often do you need to have someone help you when you read instructions, pamphlets, or other written materials from your doctor or pharmacy?: 1 - Never What is the last grade level you completed in school?: High School  Diabetic?no   Interpreter Needed?: No  Information entered by :: L.Jackelin Correia,LPN   Activities of Daily Living In your present state of health, do you have any difficulty performing the following activities: 03/10/2021 09/24/2020  Hearing? N N  Vision? N N  Difficulty concentrating or making decisions? N N  Walking or climbing stairs? N N  Dressing or bathing? N N  Doing errands, shopping? N N  Preparing Food and eating ? N -  Using the Toilet? N -  In the past six months, have you accidently leaked urine? N -  Do you have problems with loss of bowel control? N -  Managing your Medications? N -  Managing your Finances? N -  Housekeeping or managing your Housekeeping? N -  Some recent data might be hidden    Patient Care Team: Sheliah Hatch, MD as PCP - General Tat, Octaviano Batty, DO as Consulting Physician (Neurology) Salvatore Marvel, MD  as Consulting Physician (Orthopedic Surgery) Gean Birchwood, MD as Consulting Physician (Orthopedic Surgery) Dahlia Byes, [facility] as Pharmacist (Pharmacist)  Indicate any recent Medical Services you may have received from other than [facility] providers in the past year (date may be approximate).     Assessment:   This is a routine wellness examination for [place].  Hearing/Vision screen Vision Screening - Comments:: Annual eye exams   Dietary issues and exercise activities discussed: Current Exercise Habits: The patient does not participate in regular exercise at present, Exercise limited by: neurologic condition(s)   Goals Addressed   None    Depression Screen PHQ 2/9 Scores 03/10/2021 03/10/2021 01/26/2021 01/14/2021 12/03/2020 10/21/2020 09/24/2020  PHQ - 2 Score 0 0 0 3 4 0 0  PHQ- 9 Score - - 0 12 17 0 0    Fall Risk Fall Risk  03/10/2021 03/07/2021 01/26/2021 01/14/2021 01/14/2021  Falls in the past year? 0 Comment - - - - -  Number falls in past yr: 0 Injury with Fall? 0 0 1 0 1  Comment - - - - -  Risk Factor Category  - - - - -  Risk for fall due to : Impaired balance/gait;Impaired mobility - History of fall(s) History of fall(s) History of fall(s)  Follow up Falls evaluation completed;Education provided - Falls evaluation completed Falls evaluation completed Falls evaluation completed    FALL RISK PREVENTION PERTAINING TO THE HOME:  Any stairs in or around the home? Yes  If so, are there any without handrails? No  Home free of loose throw rugs in walkways, pet beds, electrical cords, etc? Yes  Adequate lighting in your home to reduce risk of falls? Yes   ASSISTIVE DEVICES UTILIZED TO PREVENT FALLS:  Life alert? No  Use of a cane, walker or w/c? Yes  Grab bars in the bathroom? Yes  Shower chair or bench in shower? Yes  Elevated toilet seat or a handicapped toilet? Yes    Cognitive Function: Normal cognitive status assessed by direct observation by this  Nurse Health Advisor. No abnormalities found.   MMSE - Mini Mental State Exam 11/07/2017  Orientation to time 5  Orientation to Place 5  Registration 3  Attention/ Calculation 3  Recall 3  Language- name 2 objects 2  Language- repeat 1  Language- follow 3 step command 3  Language- read & follow direction 1  Write a sentence 1  Copy design 1  Total score 28        Immunizations Immunization History  Administered Date(s) Administered   Influenza Whole 01/31/2007, 12/24/2008, 12/30/2009   Influenza,inj,Quad PF,6+ Mos 02/09/2014, 02/15/2015   PFIZER(Purple Top)SARS-COV-2 Vaccination 04/17/2019, 05/08/2019   Pneumococcal Conjugate-13 02/09/2014   Pneumococcal Polysaccharide-23 02/15/2015    TDAP status: Due, Education has been provided regarding the importance of this vaccine. Advised may receive this vaccine at local pharmacy or Health Dept. Aware to provide a copy of the vaccination record if obtained from local pharmacy or Health Dept. Verbalized acceptance and understanding.  Flu Vaccine status: Due, Education has been provided regarding the importance of this vaccine. Advised may receive this vaccine at local pharmacy or Health Dept. Aware to provide a copy of the vaccination record if obtained from local pharmacy or Health Dept. Verbalized acceptance and understanding.  Pneumococcal vaccine status: Up to date  Covid-19 vaccine status: Completed vaccines  Qualifies for Shingles Vaccine? Yes   Zostavax completed No   Shingrix Completed?: No.    Education has been provided regarding the importance of this vaccine. Patient has been advised to call insurance company to determine out of pocket expense if they have not yet received this vaccine. Advised may also receive vaccine at local pharmacy or Health Dept. Verbalized acceptance and understanding.  Screening Tests Health Maintenance  Topic Date Due   MAMMOGRAM  11/14/2018   INFLUENZA VACCINE  11/01/2020   Fecal DNA  (Cologuard)  11/02/2020   TETANUS/TDAP  04/02/2021 (Originally 10/29/1964)   COVID-19 Vaccine (3 - Pfizer risk series) 04/02/2021 (Originally 06/05/2019)   Zoster Vaccines- Shingrix (1 of 2) 04/02/2021 (Originally 10/29/1964)   Pneumonia Vaccine 43+ Years old  Completed   DEXA SCAN  Completed   Hepatitis C Screening  Completed   HPV VACCINES  Aged Out    Health Maintenance  Health Maintenance Due  Topic Date Due   MAMMOGRAM  11/14/2018   INFLUENZA VACCINE  11/01/2020   Fecal DNA (Cologuard)  11/02/2020    Colorectal cancer screening: Type of screening: Cologuard. Completed per patient completed 2021. Repeat every 3 years  Mammogram status: No longer required due to patient declines due to health condition and unable to come into office in person .Marland Kitchen  Mammogram status: No longer required due to patient declines due to health condition and unable to come into office in person .Marland Kitchen  Lung Cancer Screening: (Low Dose CT Chest recommended if Age 22-80 years, 30 pack-year currently smoking OR have quit w/in 15years.) does not qualify.   Lung Cancer Screening Referral: n/a  Additional Screening:  Hepatitis C Screening: does not qualify; Completed 04/04/2007  Vision Screening: Recommended annual ophthalmology exams for early detection of glaucoma and other disorders of the eye. Is the patient up to date with their annual eye exam?  Yes  Who is the provider or what is the name of the office in which the patient attends annual eye exams? Dr. Hale Bogus [facility]  If pt is not established with a provider, would they like to be referred to a provider to establish care? No .   Dental Screening: Recommended annual dental exams for proper oral hygiene  Community Resource Referral / Chronic Care Management: CRR required this visit?  No   CCM required this visit?  No  Plan:     I have personally reviewed and noted the following in the patient's chart:   Medical and social history Use  of alcohol, tobacco or illicit drugs  Current medications and supplements including opioid prescriptions.  Functional ability and status Nutritional status Physical activity Advanced directives List of other physicians Hospitalizations, surgeries, and ER visits in previous 12 months Vitals Screenings to include cognitive, depression, and falls Referrals and appointments  In addition, I have reviewed and discussed with patient certain preventive protocols, quality metrics, and best practice recommendations. A written personalized care plan for preventive services as well as general preventive health recommendations were provided to patient.     March Rummage, LPN   [id number]   Nurse Notes: none

## 2021-03-10 NOTE — Patient Instructions (Signed)
Carly [name] , Thank you for taking time to come for your Medicare Wellness Visit. I appreciate your ongoing commitment to your health goals. Please review the following plan we discussed and let me know if I can assist you in the future.   Screening recommendations/referrals: Colonoscopy: Per patient completed cologuard no results in the chart  Mammogram: declined  Bone Density: declined  Recommended yearly ophthalmology/optometry visit for glaucoma screening and checkup Recommended yearly dental visit for hygiene and checkup  Vaccinations: Influenza vaccine: declined  Pneumococcal vaccine: completed  Tdap vaccine: due  Shingles vaccine: declined     Advanced directives: will provide copies   Conditions/risks identified: none   Next appointment: none    Preventive Care 65 Years and Older, Female Preventive care refers to lifestyle choices and visits with your health care provider that can promote health and wellness. What does preventive care include? A yearly physical exam. This is also called an annual well check. Dental exams once or twice a year. Routine eye exams. Ask your health care provider how often you should have your eyes checked. Personal lifestyle choices, including: Daily care of your teeth and gums. Regular physical activity. Eating a healthy diet. Avoiding tobacco and drug use. Limiting alcohol use. Practicing safe sex. Taking low-dose aspirin every day. Taking vitamin and mineral supplements as recommended by your health care provider. What happens during an annual well check? The services and screenings done by your health care provider during your annual well check will depend on your age, overall health, lifestyle risk factors, and family history of disease. Counseling  Your health care provider may ask you questions about your: Alcohol use. Tobacco use. Drug use. Emotional well-being. Home and relationship well-being. Sexual activity. Eating  habits. History of falls. Memory and ability to understand (cognition). Work and work Astronomer. Reproductive health. Screening  You may have the following tests or measurements: Height, weight, and BMI. Blood pressure. Lipid and cholesterol levels. These may be checked every 5 years, or more frequently if you are over 31 years old. Skin check. Lung cancer screening. You may have this screening every year starting at age 73 if you have a 30-pack-year history of smoking and currently smoke or have quit within the past 15 years. Fecal occult blood test (FOBT) of the stool. You may have this test every year starting at age 73. Flexible sigmoidoscopy or colonoscopy. You may have a sigmoidoscopy every 5 years or a colonoscopy every 10 years starting at age 73. Hepatitis C blood test. Hepatitis B blood test. Sexually transmitted disease (STD) testing. Diabetes screening. This is done by checking your blood sugar (glucose) after you have not eaten for a while (fasting). You may have this done every 1-3 years. Bone density scan. This is done to screen for osteoporosis. You may have this done starting at age 73. Mammogram. This may be done every 1-2 years. Talk to your health care provider about how often you should have regular mammograms. Talk with your health care provider about your test results, treatment options, and if necessary, the need for more tests. Vaccines  Your health care provider may recommend certain vaccines, such as: Influenza vaccine. This is recommended every year. Tetanus, diphtheria, and acellular pertussis (Tdap, Td) vaccine. You may need a Td booster every 10 years. Zoster vaccine. You may need this after age 39. Pneumococcal 13-valent conjugate (PCV13) vaccine. One dose is recommended after age 97. Pneumococcal polysaccharide (PPSV23) vaccine. One dose is recommended after age 73. Talk to your  health care provider about which screenings and vaccines you need and how  often you need them. This information is not intended to replace advice given to you by your health care provider. Make sure you discuss any questions you have with your health care provider. Document Released: 04/16/2015 Document Revised: 12/08/2015 Document Reviewed: 01/19/2015 Elsevier Interactive Patient Education  2017 [place] Prevention in the Home Falls can cause injuries. They can happen to people of all ages. There are many things you can do to make your home safe and to help prevent falls. What can I do on the outside of my home? Regularly fix the edges of walkways and driveways and fix any cracks. Remove anything that might make you trip as you walk through a door, such as a raised step or threshold. Trim any bushes or trees on the path to your home. Use bright outdoor lighting. Clear any walking paths of anything that might make someone trip, such as rocks or tools. Regularly check to see if handrails are loose or broken. Make sure that both sides of any steps have handrails. Any raised decks and porches should have guardrails on the edges. Have any leaves, snow, or ice cleared regularly. Use sand or salt on walking paths during winter. Clean up any spills in your garage right away. This includes oil or grease spills. What can I do in the bathroom? Use night lights. Install grab bars by the toilet and in the tub and shower. Do not use towel bars as grab bars. Use non-skid mats or decals in the tub or shower. If you need to sit down in the shower, use a plastic, non-slip stool. Keep the floor dry. Clean up any water that spills on the floor as soon as it happens. Remove soap buildup in the tub or shower regularly. Attach bath mats securely with double-sided non-slip rug tape. Do not have throw rugs and other things on the floor that can make you trip. What can I do in the bedroom? Use night lights. Make sure that you have a light by your bed that is easy to  reach. Do not use any sheets or blankets that are too big for your bed. They should not hang down onto the floor. Have a firm chair that has side arms. You can use this for support while you get dressed. Do not have throw rugs and other things on the floor that can make you trip. What can I do in the kitchen? Clean up any spills right away. Avoid walking on wet floors. Keep items that you use a lot in easy-to-reach places. If you need to reach something above you, use a strong step stool that has a grab bar. Keep electrical cords out of the way. Do not use floor polish or wax that makes floors slippery. If you must use wax, use non-skid floor wax. Do not have throw rugs and other things on the floor that can make you trip. What can I do with my stairs? Do not leave any items on the stairs. Make sure that there are handrails on both sides of the stairs and use them. Fix handrails that are broken or loose. Make sure that handrails are as long as the stairways. Check any carpeting to make sure that it is firmly attached to the stairs. Fix any carpet that is loose or worn. Avoid having throw rugs at the top or bottom of the stairs. If you do have throw rugs, attach them  to the floor with carpet tape. Make sure that you have a light switch at the top of the stairs and the bottom of the stairs. If you do not have them, ask someone to add them for you. What else can I do to help prevent falls? Wear shoes that: Do not have high heels. Have rubber bottoms. Are comfortable and fit you well. Are closed at the toe. Do not wear sandals. If you use a stepladder: Make sure that it is fully opened. Do not climb a closed stepladder. Make sure that both sides of the stepladder are locked into place. Ask someone to hold it for you, if possible. Clearly mark and make sure that you can see: Any grab bars or handrails. First and last steps. Where the edge of each step is. Use tools that help you move  around (mobility aids) if they are needed. These include: Canes. Walkers. Scooters. Crutches. Turn on the lights when you go into a dark area. Replace any light bulbs as soon as they burn out. Set up your furniture so you have a clear path. Avoid moving your furniture around. If any of your floors are uneven, fix them. If there are any pets around you, be aware of where they are. Review your medicines with your doctor. Some medicines can make you feel dizzy. This can increase your chance of falling. Ask your doctor what other things that you can do to help prevent falls. This information is not intended to replace advice given to you by your health care provider. Make sure you discuss any questions you have with your health care provider. Document Released: 01/14/2009 Document Revised: 08/26/2015 Document Reviewed: 04/24/2014 Elsevier Interactive Patient Education  2017 [employer].

## 2021-03-12 DIAGNOSIS — Z79899 Other long term (current) drug therapy: Secondary | ICD-10-CM | POA: Diagnosis not present

## 2021-03-12 DIAGNOSIS — I951 Orthostatic hypotension: Secondary | ICD-10-CM | POA: Diagnosis not present

## 2021-03-12 DIAGNOSIS — Z20822 Contact with and (suspected) exposure to covid-19: Secondary | ICD-10-CM | POA: Diagnosis not present

## 2021-03-12 DIAGNOSIS — G20 Parkinson's disease: Secondary | ICD-10-CM | POA: Diagnosis not present

## 2021-03-12 DIAGNOSIS — R531 Weakness: Secondary | ICD-10-CM | POA: Diagnosis not present

## 2021-03-12 DIAGNOSIS — Z950 Presence of cardiac pacemaker: Secondary | ICD-10-CM | POA: Diagnosis not present

## 2021-03-12 DIAGNOSIS — I517 Cardiomegaly: Secondary | ICD-10-CM | POA: Diagnosis not present

## 2021-03-12 DIAGNOSIS — I495 Sick sinus syndrome: Secondary | ICD-10-CM | POA: Diagnosis not present

## 2021-03-12 DIAGNOSIS — R55 Syncope and collapse: Secondary | ICD-10-CM | POA: Diagnosis not present

## 2021-03-12 DIAGNOSIS — N390 Urinary tract infection, site not specified: Secondary | ICD-10-CM | POA: Diagnosis not present

## 2021-03-12 DIAGNOSIS — R42 Dizziness and giddiness: Secondary | ICD-10-CM | POA: Diagnosis not present

## 2021-03-12 DIAGNOSIS — I480 Paroxysmal atrial fibrillation: Secondary | ICD-10-CM | POA: Diagnosis not present

## 2021-03-12 DIAGNOSIS — I493 Ventricular premature depolarization: Secondary | ICD-10-CM | POA: Diagnosis not present

## 2021-03-13 DIAGNOSIS — R55 Syncope and collapse: Secondary | ICD-10-CM | POA: Diagnosis not present

## 2021-03-31 ENCOUNTER — Telehealth: Payer: Self-pay | Admitting: Pharmacist

## 2021-03-31 NOTE — Progress Notes (Addendum)
Chronic Care Management Pharmacy Assistant   Name: LIDDIE CHICHESTER  MRN: [id number] DOB: 1945-07-02   Reason for Encounter: Medication Coordination Call     Medications: Outpatient Encounter Medications as of 03/31/2021  Medication Sig   AMBULATORY NON FORMULARY MEDICATION Medication Name: Stairlift G20   busPIRone (BUSPAR) 10 MG tablet Take 1 tablet (10 mg total) by mouth 3 (three) times daily. (Patient taking differently: Take 7.5 mg by mouth 2 (two) times daily. 7.5 mg two times daily)   carbidopa-levodopa (SINEMET CR) 50-200 MG tablet TAKE ONE TABLET BY MOUTH EVERYDAY AT BEDTIME   carbidopa-levodopa (SINEMET IR) 25-100 MG tablet TAKE TWO TABLETS BY MOUTH AT 6am & 10am and TAKE ONE TABLET BY MOUTH AT 2pm & 6pm   Cholecalciferol (VITAMIN D3) 100000 UNIT/GM POWD Take by mouth.   flecainide (TAMBOCOR) 50 MG tablet Take 50 mg by mouth 2 (two) times daily.   ketorolac (ACULAR) 0.5 % ophthalmic solution    latanoprost (XALATAN) 0.005 % ophthalmic solution Place 1 drop into both eyes at bedtime. (Patient not taking: Reported on 03/10/2021)   midodrine (PROAMATINE) 2.5 MG tablet Take 1 tablet (2.5 mg total) by mouth 3 (three) times daily with meals.   midodrine (PROAMATINE) 2.5 MG tablet Take 1 tablet (2.5 mg total) by mouth 3 (three) times daily with meals.   Vitamin D, Ergocalciferol, (DRISDOL) 1.25 MG (50000 UNIT) CAPS capsule Take 1 capsule (50,000 Units total) by mouth every 7 (seven) days.   No facility-administered encounter medications on file as of 03/31/2021.   Reviewed chart for medication changes ahead of medication coordination call.  No OVs, Consults, or hospital visits since last care coordination call/Pharmacist visit. (If appropriate, list visit date, provider name)  No medication changes indicated OR if recent visit, treatment plan here.  BP Readings from Last 3 Encounters:  01/26/21 (!) 120/56  10/21/20 130/85  09/24/20 122/75    Lab Results  Component Value  Date   HGBA1C 5.8 (H) 08/29/2013     Patient obtains medications through Vials  w/o safety caps 30 Days   Last adherence delivery included: (medication name and frequency)  carbidopa-levodopa (SINEMET IR) 25-100 MG tablet carbidopa-levodopa (SINEMET CR) 50-200 MG tablet Vitamin D, Ergocalciferol, (DRISDOL) 1.25 MG (50000 UNIT) CAPS capsule Vitamin B-12 10000 tablet Buspirone 7.5 mg 1 tablet three times a day (increased  from 7.5 mg on 01/14/21 but per caregiver they have decided not to continue at 10 mg and will continue at previous dosing) Meloxicam 15 mg 1 tablet daily as needed   Patient is due for next adherence delivery on: 04/12/2021. Called patient and reviewed medications and coordinated delivery.   This delivery to include:  carbidopa-levodopa (SINEMET IR) 25-100 MG tablet carbidopa-levodopa (SINEMET CR) 50-200 MG tablet Vitamin D3 2000IU 1 tablet daily  Vitamin B-12 10000 tablet Buspirone 7.5 mg 1 tablet three times a day  Macrobid 100 mg  Midodrine 2.5 mg 1 tablet three times daily Flecainide 50 mg 1 tablet daily   Patient needs refills for None.  Confirmed delivery date of 04/12/2021, advised patient that pharmacy will contact them the morning of delivery.   Care Gaps  AWV: done 03/10/21 Colonoscopy: unknown  DM Eye Exam: N/A DM Foot Exam:N/A Microalbumin: N/A HbgAIC: N/A DEXA: 03/02/15 Mammogram: 11/13/17  Star Rating Drugs: No star rating drugs noted.   No future appointments.   Eugenie Filler, CCMA Clinical Pharmacist Assistant  [phone]  8 minutes spent in review, coordination, and documentation.  Reviewed by: Ephriam Knuckles  Earlene Plater, PharmD Clinical Pharmacist [phone]

## 2021-04-05 ENCOUNTER — Ambulatory Visit: Payer: Medicare HMO | Admitting: Neurology

## 2021-04-13 DIAGNOSIS — R55 Syncope and collapse: Secondary | ICD-10-CM | POA: Diagnosis not present

## 2021-04-13 DIAGNOSIS — R9431 Abnormal electrocardiogram [ECG] [EKG]: Secondary | ICD-10-CM | POA: Diagnosis not present

## 2021-04-13 DIAGNOSIS — R531 Weakness: Secondary | ICD-10-CM | POA: Diagnosis not present

## 2021-04-13 DIAGNOSIS — I517 Cardiomegaly: Secondary | ICD-10-CM | POA: Diagnosis not present

## 2021-04-13 DIAGNOSIS — Z950 Presence of cardiac pacemaker: Secondary | ICD-10-CM | POA: Diagnosis not present

## 2021-04-13 DIAGNOSIS — I951 Orthostatic hypotension: Secondary | ICD-10-CM | POA: Diagnosis not present

## 2021-04-14 DIAGNOSIS — I4439 Other atrioventricular block: Secondary | ICD-10-CM | POA: Diagnosis not present

## 2021-04-21 DIAGNOSIS — N390 Urinary tract infection, site not specified: Secondary | ICD-10-CM | POA: Diagnosis not present

## 2021-04-21 DIAGNOSIS — R351 Nocturia: Secondary | ICD-10-CM | POA: Diagnosis not present

## 2021-04-28 ENCOUNTER — Other Ambulatory Visit: Payer: Self-pay | Admitting: Neurology

## 2021-04-28 ENCOUNTER — Other Ambulatory Visit: Payer: Self-pay | Admitting: Family Medicine

## 2021-04-28 DIAGNOSIS — X58XXXA Exposure to other specified factors, initial encounter: Secondary | ICD-10-CM | POA: Diagnosis not present

## 2021-04-28 DIAGNOSIS — S70312A Abrasion, left thigh, initial encounter: Secondary | ICD-10-CM | POA: Diagnosis not present

## 2021-04-28 DIAGNOSIS — R55 Syncope and collapse: Secondary | ICD-10-CM | POA: Diagnosis not present

## 2021-04-28 DIAGNOSIS — F411 Generalized anxiety disorder: Secondary | ICD-10-CM

## 2021-04-28 DIAGNOSIS — E872 Acidosis, unspecified: Secondary | ICD-10-CM | POA: Diagnosis not present

## 2021-04-28 DIAGNOSIS — R4020 Unspecified coma: Secondary | ICD-10-CM | POA: Diagnosis not present

## 2021-04-28 DIAGNOSIS — R9431 Abnormal electrocardiogram [ECG] [EKG]: Secondary | ICD-10-CM | POA: Diagnosis not present

## 2021-04-28 DIAGNOSIS — G249 Dystonia, unspecified: Secondary | ICD-10-CM

## 2021-04-28 DIAGNOSIS — G20 Parkinson's disease: Secondary | ICD-10-CM

## 2021-04-28 DIAGNOSIS — F0670 Mild neurocognitive disorder due to known physiological condition without behavioral disturbance: Secondary | ICD-10-CM

## 2021-04-29 ENCOUNTER — Other Ambulatory Visit: Payer: Self-pay

## 2021-04-29 ENCOUNTER — Telehealth: Payer: Self-pay | Admitting: Pharmacist

## 2021-04-29 NOTE — Progress Notes (Addendum)
Chronic Care Management Pharmacy Assistant   Name: Carly [name]  MRN: [id number] DOB: 25-Mar-1946   Reason for Encounter: Monthly Medication Coordination   Medications: Outpatient Encounter Medications as of 04/29/2021  Medication Sig   AMBULATORY NON FORMULARY MEDICATION Medication Name: Stairlift G20   busPIRone (BUSPAR) 10 MG tablet Take 1 tablet (10 mg total) by mouth 3 (three) times daily. (Patient taking differently: Take 7.5 mg by mouth 2 (two) times daily. 7.5 mg two times daily)   carbidopa-levodopa (SINEMET CR) 50-200 MG tablet TAKE ONE TABLET BY MOUTH EVERYDAY AT BEDTIME   carbidopa-levodopa (SINEMET IR) 25-100 MG tablet TAKE TWO TABLETS BY MOUTH AT 6am & 10am and TAKE ONE TABLET BY MOUTH AT 2pm & 6pm   Cholecalciferol (VITAMIN D3) 100000 UNIT/GM POWD Take by mouth.   flecainide (TAMBOCOR) 50 MG tablet Take 50 mg by mouth 2 (two) times daily.   ketorolac (ACULAR) 0.5 % ophthalmic solution    latanoprost (XALATAN) 0.005 % ophthalmic solution Place 1 drop into both eyes at bedtime. (Patient not taking: Reported on 03/10/2021)   midodrine (PROAMATINE) 2.5 MG tablet Take 1 tablet (2.5 mg total) by mouth 3 (three) times daily with meals.   midodrine (PROAMATINE) 2.5 MG tablet Take 1 tablet (2.5 mg total) by mouth 3 (three) times daily with meals.   Vitamin D, Ergocalciferol, (DRISDOL) 1.25 MG (50000 UNIT) CAPS capsule Take 1 capsule (50,000 Units total) by mouth every 7 (seven) days.   No facility-administered encounter medications on file as of 04/29/2021.   Reviewed chart for medication changes ahead of medication coordination call.  No OVs, Consults, or hospital visits since last care coordination call/Pharmacist visit. (If appropriate, list visit date, provider name)  No medication changes indicated OR if recent visit, treatment plan here.  BP Readings from Last 3 Encounters:  01/26/21 (!) 120/56  10/21/20 130/85  09/24/20 122/75    Lab Results  Component Value  Date   HGBA1C 5.8 (H) 08/29/2013     Patient obtains medications through Vials  w/o Safety caps 30 Days   Last adherence delivery included: (medication name and frequency)  carbidopa-levodopa (SINEMET IR) 25-100 MG tablet carbidopa-levodopa (SINEMET CR) 50-200 MG tablet Vitamin D3 2000IU 1 tablet daily  Vitamin B-12 10000 tablet Buspirone 7.5 mg 1 tablet three times a day  Macrobid 100 mg  Midodrine 2.5 mg 1 tablet three times daily Flecainide 50 mg 1 tablet daily    Patient is due for next adherence delivery on: 05/11/21. Called patient and reviewed medications and coordinated delivery.  This delivery to include:  carbidopa-levodopa (SINEMET IR) 25-100 MG tablet carbidopa-levodopa (SINEMET CR) 50-200 MG tablet Vitamin D3 2000IU 1 tablet daily  Vitamin B-12 10000 tablet Buspirone 7.5 mg 1 tablet three times a day  Macrobid 100 mg  Midodrine 2.5 mg 1 tablet three times daily Flecainide 50 mg 1 tablet daily    Patient needs refills for Buspirone 7.5 mg 1 tablet three times a day for 30 day supply.  Confirmed delivery date of 05/11/21, advised patient that pharmacy will contact them the morning of delivery.    Care Gaps   AWV: done 03/10/21 Colonoscopy: unknown  DM Eye Exam: N/A DM Foot Exam:N/A Microalbumin: N/A HbgAIC: N/A DEXA: 03/02/15 Mammogram: 11/13/17   Star Rating Drugs: No star rating drugs noted.    Future Appointments  Date Time Provider Department Center  09/29/2021 10:45 AM Tat, Octaviano Batty, DO [facility] None    Eugenie Filler, CCMA Clinical Pharmacist Assistant  [phone]  [phone]  10 minutes spent in review, coordination, and documentation.  Reviewed by: Willa Frater, PharmD Clinical Pharmacist [phone]

## 2021-05-05 ENCOUNTER — Other Ambulatory Visit: Payer: Self-pay | Admitting: Family Medicine

## 2021-05-05 DIAGNOSIS — F411 Generalized anxiety disorder: Secondary | ICD-10-CM

## 2021-05-06 ENCOUNTER — Other Ambulatory Visit: Payer: Self-pay

## 2021-05-06 MED ORDER — BUSPIRONE HCL 10 MG PO TABS: 10.0000 mg | ORAL_TABLET | Freq: Three times a day (TID) | ORAL | 3 refills | Status: DC

## 2021-05-13 ENCOUNTER — Telehealth: Payer: Self-pay | Admitting: Neurology

## 2021-05-13 NOTE — Telephone Encounter (Signed)
Patients son [place] called regarding his mother. She had covid and since then she has declined rather fast. She has had a couple of ER visits and falls. She has an appt 6/29, he was wondering if she needed to be seen sooner. [place] also is asking for the next visit to be a VV due to her not being mobile.

## 2021-05-16 NOTE — Telephone Encounter (Signed)
Patient is scheduled on 05/27/21 for 9:00 AM called patients son and asked him to call be back to confirm. They will need to take patients BP standing and sitting

## 2021-05-16 NOTE — Telephone Encounter (Signed)
Patients son [place] called back and confirmed the 05/27/2021 9:00 appointment.  He said they will still do the virtual visit.  They will also take the blood pressure standing and sitting.

## 2021-05-16 NOTE — Telephone Encounter (Signed)
Called patients son [place] he said that they have been monitoring the patients blood pressure and before it was running low and high he said it has leveled off a t around 120/80 on average . Told [place] that we were looking for a follow up appt for patient and would call ASAP

## 2021-05-25 NOTE — Progress Notes (Signed)
Virtual Visit Via Video   The purpose of this virtual visit is to provide medical care while limiting exposure to the novel coronavirus.    Consent was obtained for video visit:  Yes.   Answered questions that patient had about telehealth interaction:  Yes.   I discussed the limitations, risks, security and privacy concerns of performing an evaluation and management service by telemedicine. I also discussed with the patient that there may be a patient responsible charge related to this service. The patient expressed understanding and agreed to proceed.  Pt location: Home Physician Location: office Name of referring provider:  Sheliah Hatch, MD I connected with Carly [name] at patients initiation/request on 05/27/2021 at  9:15 AM EST by video enabled telemedicine application and verified that I am speaking with the correct person using two identifiers. Pt MRN:  [id number] Pt DOB:  09-May-1945 Video Participants:  Carly [name];  son/sister in law supplements hx; nurse present  Assessment/Plan:   1.  Parkinsons Disease  -Patient was previously on carbidopa/levodopa 25/100, 2 tablets at 6 AM/2 tablets at 10 AM/1 tablet at 2 PM/1 tablet at 6 PM.  They have changed that to 1 tablet every hour for 6 hours.  While I really do not have an objection to it, and it may help her feel less sleepy, it is very hard to avoid protein (they are giving it to her about every hour).  We discussed potential interactions with protein.  -Continue carbidopa/levodopa 50/200 at bedtime  -PT coming to the home  -they ask about hospital bed but wanted fully electric and discussed with them that insurance would not pay for that.  I am happy to write for 1, but they would only pay for semi-electric, if that.  They decided to hold on that request.   2.  Probable PDD  -Had neurocognitive testing in June, 2021 that just demonstrated MCI but likely has PDD now  3.  B12 deficiency  -On oral  supplementation.  4.  GAD  -Following with primary care.  On citalopram and BuSpar.  5.  Syncope  -Multiple episodes.  -her new RN has stopped her midodrine and only giving it if BP is HIGH.  Discussed with the nurse directly that this medicine is meant to raise blood pressure and not lower it.  I want them to restart the midodrine 2.5 mg three times per day with meals.  I would guess that she needs more than this but difficult to evaluate in a video visit.  -Patient needs to increase water intake.  Discussed this.  -Cardiologist had told him to ask me about droxidopa.  In my experience, that has not been any more beneficial than midodrine.  In addition, it is quite costly and hard to get approval.   6.  Patient has requested video visit, but it is very difficult to treat orthostatic hypotension, when I cannot get orthostatic vitals.  Have discussed this with the patient and family.  Have discussed transitioning her care somewhere closer to home.  For now, they would like to continue care here and will try to get her here next visit.  7.  Discussed with patient, family and nurse present that they need to call before medications are changed.  Her nurse has done some changes of medications and discontinue medications that really were not appropriate.  Patient and family do report that she is doing much better, but I think that is because she was doing so poorly when  she had COVID, and is just now getting over COVID.     Subjective:   Carly [name] was seen today in follow up for Parkinsons disease.  My previous records were reviewed prior to todays visit as well as outside records available to me.  Patient with family who supplements history.  Patient worked in today, virtual visit requested.  Last visit, midodrine added because of syncope, although she also had those events because of SVT (pacemaker not placed).  Since that time, she has had multiple syncopal episodes, being in the emergency  room in December and twice in January.  December ER records indicate that patient had syncopal episode after "poor p.o. intake."  Cardiology recommended increased fluid intake, but it was also noted that patient had continued orthostatic hypotension on date of discharge.  Cardiology also told her to ask [place] about droxidopa (although they also reported that midodrine had just been added that day (I had seen her 5 days prior).  Patient was back in the emergency room 1 month later.  This time, they reported possible seizure-like activity.  Emergency room physician stated that family was in the lobby but he did not speak with them, but family stated that they told her that she had full body shaking and foaming at the mouth.  She had apparently had 2 syncopal episodes earlier that day and then the third presented like seizure.  Her pacemaker was interrogated.  I do not see orthostatics were done, but she was discharged from the emergency room with a diagnosis of orthostatic hypotension.  She was back in the emergency room about 15 days later.  She got up to use the bathroom and became lightheaded and had a syncopal episode.  Triage vitals were actually high (160s).  Son states that after this, she had Covid and following that she got very weak.  He states that he called our office last week b/c she would "open her eyes but hardly speak."  They then hired a new Museum/gallery conservator from [facility] and they state that she stopped the patients midodrine and buspar.  She also changed her carbidopa/levodopa 25/100 and is giving her 1 po 6 times per day.  If her BP is high, she will give her midodrine.  They feel she got much better this week (her covid has also resolved) and she is better than she has been for quite some time.    Current prescribed movement disorder medications: Carbidopa/levodopa 25/100, 2 tablets at 6 AM/2 tablets at 10 AM/1 tablet at 2 PM/1 tablet at 6 PM  Carbidopa/levodopa 50/200 at bedtime B12  supplementation     PREVIOUS MEDICATIONS: Sinemet, Sinemet CR, and Mirapex (stopped because of memory change)  ALLERGIES:   Allergies  Allergen Reactions   Codeine Nausea Only and Other (See Comments)    hallucinations    CURRENT MEDICATIONS:  Outpatient Encounter Medications as of 05/27/2021  Medication Sig   AMBULATORY NON FORMULARY MEDICATION Medication Name: Stairlift G20   carbidopa-levodopa (SINEMET CR) 50-200 MG tablet TAKE ONE TABLET BY MOUTH EVERYDAY AT BEDTIME   carbidopa-levodopa (SINEMET IR) 25-100 MG tablet TAKE TWO TABLETS BY MOUTH at 6 am & 10 am AND TAKE ONE TABLET BY MOUTH at 2 pm & 6 pm   Cholecalciferol (VITAMIN D3) 100000 UNIT/GM POWD Take by mouth.   flecainide (TAMBOCOR) 50 MG tablet Take 50 mg by mouth 2 (two) times daily.   ketorolac (ACULAR) 0.5 % ophthalmic solution    Vitamin D, Ergocalciferol, (  DRISDOL) 1.25 MG (50000 UNIT) CAPS capsule Take 1 capsule (50,000 Units total) by mouth every 7 (seven) days.   [DISCONTINUED] busPIRone (BUSPAR) 10 MG tablet Take 1 tablet (10 mg total) by mouth 3 (three) times daily.   [DISCONTINUED] latanoprost (XALATAN) 0.005 % ophthalmic solution Place 1 drop into both eyes at bedtime. (Patient not taking: Reported on 03/10/2021)   [DISCONTINUED] midodrine (PROAMATINE) 2.5 MG tablet Take 1 tablet (2.5 mg total) by mouth 3 (three) times daily with meals. (Patient not taking: Reported on 05/27/2021)   [DISCONTINUED] midodrine (PROAMATINE) 2.5 MG tablet Take 1 tablet (2.5 mg total) by mouth 3 (three) times daily with meals.   No facility-administered encounter medications on file as of 05/27/2021.    Objective:   PHYSICAL EXAMINATION:    VITALS:   Vitals:   05/27/21 0835  Weight: 167 lb (75.8 kg)  Height: 5\' 5"  (1.651 m)     Wt Readings from Last 3 Encounters:  05/27/21 167 lb (75.8 kg)  03/07/21 167 lb (75.8 kg)  09/24/20 167 lb (75.8 kg)   No data found.     GEN:  The patient appears stated age and is in  NAD. HEENT:  Normocephalic, atraumatic.  The mucous membranes are moist.   Neurological examination:  Orientation: The patient is alert and oriented x3.    I have reviewed and interpreted the following labs independently    Chemistry      Component Value Date/Time   NA 137 03/03/2020 1035   K 4.8 03/03/2020 1035   CL 102 03/03/2020 1035   CO2 30 03/03/2020 1035   BUN 25 (H) 03/03/2020 1035   CREATININE 1.24 (H) 03/03/2020 1035      Component Value Date/Time   CALCIUM 9.2 03/03/2020 1035   ALKPHOS 94 03/03/2020 1035   AST 16 03/03/2020 1035   ALT 5 03/03/2020 1035   BILITOT 0.8 03/03/2020 1035       Lab Results  Component Value Date   WBC 8.5 03/03/2020   HGB 12.9 03/03/2020   HCT 39.4 03/03/2020   MCV 91.2 03/03/2020   PLT 146.0 (L) 03/03/2020    Lab Results  Component Value Date   TSH 0.96 03/03/2020     Total time spent on today's visit was 42 minutes, including both face-to-face time and nonface-to-face time.  Time included that spent on review of records (prior notes available to me/labs/imaging if pertinent), discussing treatment and goals, answering patient's questions and coordinating care.  Cc:  Sheliah Hatch, MD

## 2021-05-27 ENCOUNTER — Telehealth: Payer: Medicare HMO | Admitting: Neurology

## 2021-05-27 ENCOUNTER — Other Ambulatory Visit: Payer: Self-pay

## 2021-05-27 ENCOUNTER — Encounter: Payer: Self-pay | Admitting: Neurology

## 2021-05-27 VITALS — Ht 65.0 in | Wt 167.0 lb

## 2021-05-27 DIAGNOSIS — G20 Parkinson's disease: Secondary | ICD-10-CM

## 2021-05-27 DIAGNOSIS — R55 Syncope and collapse: Secondary | ICD-10-CM

## 2021-05-27 DIAGNOSIS — G903 Multi-system degeneration of the autonomic nervous system: Secondary | ICD-10-CM

## 2021-05-30 ENCOUNTER — Telehealth: Payer: Self-pay | Admitting: Pharmacist

## 2021-05-30 DIAGNOSIS — R55 Syncope and collapse: Secondary | ICD-10-CM | POA: Diagnosis not present

## 2021-05-30 DIAGNOSIS — Z45018 Encounter for adjustment and management of other part of cardiac pacemaker: Secondary | ICD-10-CM | POA: Diagnosis not present

## 2021-05-30 DIAGNOSIS — I455 Other specified heart block: Secondary | ICD-10-CM | POA: Diagnosis not present

## 2021-05-30 DIAGNOSIS — I471 Supraventricular tachycardia: Secondary | ICD-10-CM | POA: Diagnosis not present

## 2021-05-30 DIAGNOSIS — R001 Bradycardia, unspecified: Secondary | ICD-10-CM | POA: Diagnosis not present

## 2021-05-30 DIAGNOSIS — R42 Dizziness and giddiness: Secondary | ICD-10-CM | POA: Diagnosis not present

## 2021-05-30 NOTE — Progress Notes (Addendum)
° ° °  Chronic Care Management Pharmacy Assistant   Name: Carly [name]  MRN: [id number] DOB: [birth year]   Reason for Encounter: Monthly Medication Coordination    Medications: Outpatient Encounter Medications as of 05/30/2021  Medication Sig   AMBULATORY NON FORMULARY MEDICATION Medication Name: Stairlift G20   carbidopa-levodopa (SINEMET CR) 50-200 MG tablet TAKE ONE TABLET BY MOUTH EVERYDAY AT BEDTIME   carbidopa-levodopa (SINEMET IR) 25-100 MG tablet TAKE TWO TABLETS BY MOUTH at 6 am & 10 am AND TAKE ONE TABLET BY MOUTH at 2 pm & 6 pm   Cholecalciferol (VITAMIN D3) 100000 UNIT/GM POWD Take by mouth.   flecainide (TAMBOCOR) 50 MG tablet Take 50 mg by mouth 2 (two) times daily.   ketorolac (ACULAR) 0.5 % ophthalmic solution    Vitamin D, Ergocalciferol, (DRISDOL) 1.25 MG (50000 UNIT) CAPS capsule Take 1 capsule (50,000 Units total) by mouth every 7 (seven) days.   No facility-administered encounter medications on file as of 05/30/2021.    Reviewed chart for medication changes ahead of medication coordination call.  No OVs, Consults, or hospital visits since last care coordination call/Pharmacist visit. (If appropriate, list visit date, provider name)  No medication changes indicated OR if recent visit, treatment plan here.  BP Readings from Last 3 Encounters:  01/26/21 (!) 120/56  10/21/20 130/85  09/24/20 122/75    Lab Results  Component Value Date   HGBA1C 5.8 (H) 08/29/2013     Patient obtains medications through Vials  30 Days   Last adherence delivery included: (medication name and frequency)  carbidopa-levodopa (SINEMET IR) 25-100 MG tablet carbidopa-levodopa (SINEMET CR) 50-200 MG tablet Vitamin D3 2000IU 1 tablet daily  Vitamin B-12 10000 tablet Buspirone 7.5 mg 1 tablet three times a day  Macrobid 100 mg  Midodrine 2.5 mg 1 tablet three times daily Flecainide 50 mg 1 tablet daily   Patient is due for next adherence delivery on: 06/09/21. Called patient  and reviewed medications and coordinated delivery.  This delivery to include:  carbidopa-levodopa (SINEMET IR) 25-100 MG tablet carbidopa-levodopa (SINEMET CR) 50-200 MG tablet Vitamin D3 2000IU 1 tablet daily  Vitamin B-12 10000 tablet Macrobid 100 mg  Midodrine 2.5 mg 1 tablet three times daily Flecainide 50 mg 1 tablet daily   Patient declined: Buspirone 7.5 mg 1 tablet three times a day  (pt is no longer taking per patient caregiver)  Patient needs refills for None .  Confirmed delivery date of 06/09/21, advised patient that pharmacy will contact them the morning of delivery.   Care Gaps   AWV: done 03/10/21 Colonoscopy: unknown  DM Eye Exam: N/A DM Foot Exam:N/A Microalbumin: N/A HbgAIC: N/A DEXA: 03/02/15 Mammogram: 11/13/17    Star Rating Drugs: No star rating drugs noted.   Future Appointments  Date Time Provider Department Center  09/29/2021 10:45 AM Tat, Octaviano Batty, DO [facility] None     Eugenie Filler, CCMA Clinical Pharmacist Assistant  [phone]  9 minutes spent in review, coordination, and documentation.  Reviewed by: Willa Frater, PharmD Clinical Pharmacist [phone]

## 2021-06-23 DIAGNOSIS — M545 Low back pain, unspecified: Secondary | ICD-10-CM | POA: Diagnosis not present

## 2021-06-27 DIAGNOSIS — R55 Syncope and collapse: Secondary | ICD-10-CM | POA: Diagnosis not present

## 2021-06-27 DIAGNOSIS — I471 Supraventricular tachycardia: Secondary | ICD-10-CM | POA: Diagnosis not present

## 2021-06-27 DIAGNOSIS — R0681 Apnea, not elsewhere classified: Secondary | ICD-10-CM | POA: Diagnosis not present

## 2021-06-27 DIAGNOSIS — R0683 Snoring: Secondary | ICD-10-CM | POA: Diagnosis not present

## 2021-06-28 DIAGNOSIS — H25812 Combined forms of age-related cataract, left eye: Secondary | ICD-10-CM | POA: Diagnosis not present

## 2021-06-28 DIAGNOSIS — H401124 Primary open-angle glaucoma, left eye, indeterminate stage: Secondary | ICD-10-CM | POA: Diagnosis not present

## 2021-06-28 DIAGNOSIS — H353132 Nonexudative age-related macular degeneration, bilateral, intermediate dry stage: Secondary | ICD-10-CM | POA: Diagnosis not present

## 2021-06-28 DIAGNOSIS — H401114 Primary open-angle glaucoma, right eye, indeterminate stage: Secondary | ICD-10-CM | POA: Diagnosis not present

## 2021-06-29 ENCOUNTER — Telehealth: Payer: Self-pay | Admitting: Pharmacist

## 2021-06-29 NOTE — Progress Notes (Signed)
? ? ?  Chronic Care Management ?Pharmacy Assistant  ? ?Name: Carly [name]  MRN: [id number] DOB: [birth year] ? ? ?Reason for Encounter: Monthly Medication Coordination Call  ?  ? ? ?Medications: ?Outpatient Encounter Medications as of 06/29/2021  ?Medication Sig  ? AMBULATORY NON FORMULARY MEDICATION Medication Name: Stairlift ?G20  ? carbidopa-levodopa (SINEMET CR) 50-200 MG tablet TAKE ONE TABLET BY MOUTH EVERYDAY AT BEDTIME  ? carbidopa-levodopa (SINEMET IR) 25-100 MG tablet TAKE TWO TABLETS BY MOUTH at 6 am & 10 am AND TAKE ONE TABLET BY MOUTH at 2 pm & 6 pm  ? Cholecalciferol (VITAMIN D3) 100000 UNIT/GM POWD Take by mouth.  ? flecainide (TAMBOCOR) 50 MG tablet Take 50 mg by mouth 2 (two) times daily.  ? ketorolac (ACULAR) 0.5 % ophthalmic solution   ? Vitamin D, Ergocalciferol, (DRISDOL) 1.25 MG (50000 UNIT) CAPS capsule Take 1 capsule (50,000 Units total) by mouth every 7 (seven) days.  ? ?No facility-administered encounter medications on file as of 06/29/2021.  ? ?Reviewed chart for medication changes ahead of medication coordination call. ? ?No OVs, Consults, or hospital visits since last care coordination call/Pharmacist visit. (If appropriate, list visit date, provider name) ? ?No medication changes indicated OR if recent visit, treatment plan here. ? ?BP Readings from Last 3 Encounters:  ?01/26/21 (!) 120/56  ?10/21/20 130/85  ?09/24/20 122/75  ?  ?Lab Results  ?Component Value Date  ? HGBA1C 5.8 (H) 08/29/2013  ?  ? ?Patient obtains medications through Vials w  easy open 30 Days  ? ? ?Last adherence delivery included: (medication name and frequency) ? ?carbidopa-levodopa (SINEMET IR) 25-100 MG tablet ?carbidopa-levodopa (SINEMET CR) 50-200 MG tablet ?Vitamin D3 2000IU 1 tablet daily  ?Vitamin B-12 1000 tablet ?Macrobid 100 mg  ?Midodrine 2.5 mg 1 tablet three times daily ?Flecainide 50 mg 1 tablet daily  ? ?Buspirone 7.5 mg was discontinued per caregiver.  ? ?Patient is due for next adherence delivery on:  07/11/21. ?Called patient and reviewed medications and coordinated delivery. ? ?This delivery to include: ? ?carbidopa-levodopa (SINEMET IR) 25-100 MG tablet ?carbidopa-levodopa (SINEMET CR) 50-200 MG tablet ?Vitamin D3 2000IU 1 tablet daily  ?Vitamin B-12 1000 tablet ?Midodrine 2.5 mg 1 tablet three times daily ?Flecainide 50 mg 1 tablet daily  ? ? ?Patient declined: Macrobid 100 mg completed therapy per daughter ? ? ?Patient needs refills for: Vitamin D3 2000IU 1 tablet daily  for 30 day supply. ? ? ?Confirmed delivery date of 07/11/21, advised patient that pharmacy will contact them the morning of delivery. ? ? ?Care Gaps ?  ?AWV: done 03/10/21 ?Colonoscopy: unknown  ?DM Eye Exam: N/A ?DM Foot Exam:N/A ?Microalbumin: N/A ?HbgAIC: N/A ?DEXA: 03/02/15 ?Mammogram: 11/13/17 ?  ?  ?Star Rating Drugs: ?No star rating drugs noted. ? ? ?Future Appointments  ?Date Time Provider Department Center  ?09/29/2021 10:45 AM Tat, Octaviano Batty, DO [facility] None  ? ? ? ?Liza Showfety, CCMA ?Clinical Pharmacist Assistant  ?[phone]  ? ? ? ? ? ? ? ? ?

## 2021-06-30 DIAGNOSIS — M545 Low back pain, unspecified: Secondary | ICD-10-CM | POA: Diagnosis not present

## 2021-07-06 ENCOUNTER — Other Ambulatory Visit: Payer: Self-pay | Admitting: Family Medicine

## 2021-07-27 ENCOUNTER — Other Ambulatory Visit: Payer: Self-pay | Admitting: Neurology

## 2021-07-27 DIAGNOSIS — E538 Deficiency of other specified B group vitamins: Secondary | ICD-10-CM | POA: Diagnosis not present

## 2021-07-27 DIAGNOSIS — I951 Orthostatic hypotension: Secondary | ICD-10-CM | POA: Diagnosis not present

## 2021-07-27 DIAGNOSIS — G20 Parkinson's disease: Secondary | ICD-10-CM

## 2021-07-27 DIAGNOSIS — N1831 Chronic kidney disease, stage 3a: Secondary | ICD-10-CM | POA: Diagnosis not present

## 2021-07-27 DIAGNOSIS — Z7689 Persons encountering health services in other specified circumstances: Secondary | ICD-10-CM | POA: Diagnosis not present

## 2021-07-27 DIAGNOSIS — Z780 Asymptomatic menopausal state: Secondary | ICD-10-CM | POA: Diagnosis not present

## 2021-07-27 DIAGNOSIS — M8580 Other specified disorders of bone density and structure, unspecified site: Secondary | ICD-10-CM | POA: Diagnosis not present

## 2021-07-27 DIAGNOSIS — D72819 Decreased white blood cell count, unspecified: Secondary | ICD-10-CM | POA: Diagnosis not present

## 2021-07-27 DIAGNOSIS — F0670 Mild neurocognitive disorder due to known physiological condition without behavioral disturbance: Secondary | ICD-10-CM

## 2021-07-27 DIAGNOSIS — I4891 Unspecified atrial fibrillation: Secondary | ICD-10-CM | POA: Diagnosis not present

## 2021-07-27 DIAGNOSIS — I493 Ventricular premature depolarization: Secondary | ICD-10-CM | POA: Diagnosis not present

## 2021-07-27 DIAGNOSIS — Z950 Presence of cardiac pacemaker: Secondary | ICD-10-CM | POA: Diagnosis not present

## 2021-07-27 DIAGNOSIS — R7989 Other specified abnormal findings of blood chemistry: Secondary | ICD-10-CM | POA: Diagnosis not present

## 2021-07-27 DIAGNOSIS — D696 Thrombocytopenia, unspecified: Secondary | ICD-10-CM | POA: Diagnosis not present

## 2021-07-27 DIAGNOSIS — R739 Hyperglycemia, unspecified: Secondary | ICD-10-CM | POA: Diagnosis not present

## 2021-07-28 ENCOUNTER — Telehealth: Payer: Self-pay

## 2021-07-28 ENCOUNTER — Telehealth: Payer: Self-pay | Admitting: Pharmacist

## 2021-07-28 NOTE — Telephone Encounter (Signed)
Home Health Certification or Plan of Care Tracking ? ?Is this a Certification or Plan of Care? Both  ? ?[place] Agency: [facility]   ? ?Order Number:  [place]:[id number] ? ?Has charge sheet been attached? y ? ?Where has form been placed:   folders upfront  ?

## 2021-07-28 NOTE — Progress Notes (Addendum)
? ? ?  Chronic Care Management ?Pharmacy Assistant  ? ?Name: Carly [name]  MRN: [id number] DOB: Aug 07, 1945 ? ? ?Reason for Encounter: Monthly Medication Coordination Call  ?  ? ? ?Medications: ?Outpatient Encounter Medications as of 07/28/2021  ?Medication Sig  ? AMBULATORY NON FORMULARY MEDICATION Medication Name: Stairlift ?G20  ? carbidopa-levodopa (SINEMET CR) 50-200 MG tablet TAKE ONE TABLET BY MOUTH EVERYDAY AT BEDTIME  ? carbidopa-levodopa (SINEMET IR) 25-100 MG tablet TAKE TWO TABLETS BY MOUTH at 6 am & 10 am AND TAKE ONE TABLET BY MOUTH at 2 pm & 6 pm  ? Cholecalciferol (VITAMIN D3) 100000 UNIT/GM POWD Take by mouth.  ? Cholecalciferol (VITAMIN D3) 50 MCG (2000 UT) TABS TAKE ONE TABLET BY MOUTH ONCE DAILY  ? flecainide (TAMBOCOR) 50 MG tablet Take 50 mg by mouth 2 (two) times daily.  ? ketorolac (ACULAR) 0.5 % ophthalmic solution   ? Vitamin D, Ergocalciferol, (DRISDOL) 1.25 MG (50000 UNIT) CAPS capsule Take 1 capsule (50,000 Units total) by mouth every 7 (seven) days.  ? ?No facility-administered encounter medications on file as of 07/28/2021.  ? ? ?Reviewed chart for medication changes ahead of medication coordination call. ? ?No OVs, Consults, or hospital visits since last care coordination call/Pharmacist visit. (If appropriate, list visit date, provider name) ? ?No medication changes indicated OR if recent visit, treatment plan here. ? ?BP Readings from Last 3 Encounters:  ?01/26/21 (!) 120/56  ?10/21/20 130/85  ?09/24/20 122/75  ?  ?Lab Results  ?Component Value Date  ? HGBA1C 5.8 (H) 08/29/2013  ?  ? ?Patient obtains medications through Vials w Safety Caps  30 Days  ? ?Last adherence delivery included: (medication name and frequency) ? ?carbidopa-levodopa (SINEMET IR) 25-100 MG tablet ?carbidopa-levodopa (SINEMET CR) 50-200 MG tablet ?Vitamin D3 2000IU 1 tablet daily  ?Vitamin B-12 1000 tablet ?Midodrine 2.5 mg 1 tablet three times daily ?Flecainide 50 mg 1 tablet daily  ? ?Patient declined: Macrobid  100 mg completed therapy per daughter ? ? ?Patient is due for next adherence delivery on: 08/09/21. ?Called patient and reviewed medications and coordinated delivery. ? ?This delivery to include: ? ?carbidopa-levodopa (SINEMET IR) 25-100 MG tablet ?carbidopa-levodopa (SINEMET CR) 50-200 MG tablet ?Midodrine 2.5 mg 1 tablet three times daily ?Flecainide 50 mg 1 tablet daily  ?Vitamin D3 2000IU 1 tablet daily  ? ?Patient declined the following medications (meds) due to (reason) ?Vitamin B-12 1000 tablet - patient no longer taking  ? ?Patient needs refills from specialist: Midodrine 2.5 mg 1 tablet three times daily ?Flecainide 50 mg 1 tablet daily carbidopa-levodopa (SINEMET IR) 25-100 MG tablet ?carbidopa-levodopa (SINEMET CR) 50-200 MG tablet for 30 day supply. ? ?Confirmed delivery date of 08/09/21, advised patient that pharmacy will contact them the morning of delivery. ? ? ?Care Gaps ?  ?AWV: done 03/10/21 ?Colonoscopy: unknown  ?DM Eye Exam: N/A ?DM Foot Exam:N/A ?Microalbumin: N/A ?HbgAIC: N/A ?DEXA: 03/02/15 ?Mammogram: 11/13/17 ?  ?  ?Star Rating Drugs: ?No star rating drugs noted. ? ? ?Future Appointments  ?Date Time Provider [place]  ?09/29/2021 10:45 AM Tat, Eustace Quail, DO [facility] None  ? ? ? ?Liza Showfety, CCMA ?Clinical Pharmacist Assistant  ?[phone] ? ?

## 2021-07-29 NOTE — Telephone Encounter (Signed)
Placed in your to be signed folder  ?

## 2021-08-02 NOTE — Telephone Encounter (Signed)
Form completed and placed in basket  

## 2021-08-02 NOTE — Telephone Encounter (Signed)
Faxed

## 2021-08-03 ENCOUNTER — Other Ambulatory Visit: Payer: Self-pay

## 2021-08-03 DIAGNOSIS — G903 Multi-system degeneration of the autonomic nervous system: Secondary | ICD-10-CM

## 2021-08-03 MED ORDER — MIDODRINE HCL 2.5 MG PO TABS: 2.5000 mg | ORAL_TABLET | Freq: Three times a day (TID) | ORAL | 1 refills | Status: DC

## 2021-08-26 ENCOUNTER — Telehealth: Payer: Self-pay | Admitting: Pharmacist

## 2021-08-26 NOTE — Progress Notes (Signed)
    Chronic Care Management Pharmacy Assistant   Name: Carly [name]  MRN: [id number] DOB: Sep 06, 1945   Reason for Encounter: Monthly Medication Coordination Call      Medications: Outpatient Encounter Medications as of 08/26/2021  Medication Sig   AMBULATORY NON FORMULARY MEDICATION Medication Name: Stairlift G20   carbidopa-levodopa (SINEMET CR) 50-200 MG tablet TAKE ONE TABLET BY MOUTH EVERYDAY AT BEDTIME   carbidopa-levodopa (SINEMET IR) 25-100 MG tablet TAKE TWO TABLETS BY MOUTH at 6 am & 10 am AND TAKE ONE TABLET BY MOUTH at 2 pm & 6 pm   Cholecalciferol (VITAMIN D3) 100000 UNIT/GM POWD Take by mouth.   Cholecalciferol (VITAMIN D3) 50 MCG (2000 UT) TABS TAKE ONE TABLET BY MOUTH ONCE DAILY   flecainide (TAMBOCOR) 50 MG tablet Take 50 mg by mouth 2 (two) times daily.   ketorolac (ACULAR) 0.5 % ophthalmic solution    midodrine (PROAMATINE) 2.5 MG tablet Take 1 tablet (2.5 mg total) by mouth 3 (three) times daily with meals.   Vitamin D, Ergocalciferol, (DRISDOL) 1.25 MG (50000 UNIT) CAPS capsule Take 1 capsule (50,000 Units total) by mouth every 7 (seven) days.   No facility-administered encounter medications on file as of 08/26/2021.    Reviewed chart for medication changes ahead of medication coordination call.  No OVs, Consults, or hospital visits since last care coordination call/Pharmacist visit. (If appropriate, list visit date, provider name)  No medication changes indicated OR if recent visit, treatment plan here.  BP Readings from Last 3 Encounters:  01/26/21 (!) 120/56  10/21/20 130/85  09/24/20 122/75    Lab Results  Component Value Date   HGBA1C 5.8 (H) 08/29/2013     Patient obtains medications through Vials  w Safety Caps 30 Days   Last adherence delivery included: (medication name and frequency) carbidopa-levodopa (SINEMET IR) 25-100 MG tablet carbidopa-levodopa (SINEMET CR) 50-200 MG tablet Midodrine 2.5 mg 1 tablet three times daily Flecainide 50  mg 1 tablet daily  Vitamin D3 2000IU 1 tablet daily    Patient declined the following medications (meds) due to (reason) Vitamin B-12 1000 tablet - patient no longer taking     Patient is due for next adherence delivery on: 09/08/21. Called patient and reviewed medications and coordinated delivery.  This delivery to include: carbidopa-levodopa (SINEMET IR) 25-100 MG tablet carbidopa-levodopa (SINEMET CR) 50-200 MG tablet Midodrine 2.5 mg 1 tablet three times daily Flecainide 50 mg 1 tablet daily  Vitamin D3 2000IU 1 tablet daily     Patient needs refills for None .   Confirmed delivery date of 09/08/21, advised patient that pharmacy will contact them the morning of delivery.   Care Gaps   AWV: done 03/10/21 Colonoscopy: unknown  DM Eye Exam: N/A DM Foot Exam:N/A Microalbumin: N/A HbgAIC: N/A DEXA: 03/02/15 Mammogram: 11/13/17     Star Rating Drugs: No star rating drugs noted.    Future Appointments  Date Time Provider [place]  09/29/2021 10:45 AM Tat, Eustace Quail, DO [facility] None     [name], [facility] Clinical Pharmacist Assistant  [phone]

## 2021-09-26 ENCOUNTER — Telehealth: Payer: Self-pay | Admitting: Pharmacist

## 2021-09-26 DIAGNOSIS — G903 Multi-system degeneration of the autonomic nervous system: Secondary | ICD-10-CM | POA: Diagnosis not present

## 2021-09-26 DIAGNOSIS — Z45018 Encounter for adjustment and management of other part of cardiac pacemaker: Secondary | ICD-10-CM | POA: Diagnosis not present

## 2021-09-26 DIAGNOSIS — R001 Bradycardia, unspecified: Secondary | ICD-10-CM | POA: Diagnosis not present

## 2021-09-26 DIAGNOSIS — I455 Other specified heart block: Secondary | ICD-10-CM | POA: Diagnosis not present

## 2021-09-27 ENCOUNTER — Telehealth: Payer: Self-pay | Admitting: Neurology

## 2021-09-27 NOTE — Telephone Encounter (Signed)
Just note to document patient had in person appt on 09/29/21 and cancelled and did not r/s.  Told pt/family that they needed to come in person or transfer care closer to home (was happy to help with that) but couldn't solely do video visits with me (happy to do some).

## 2021-09-29 ENCOUNTER — Ambulatory Visit: Payer: Medicare HMO | Admitting: Neurology

## 2021-10-18 ENCOUNTER — Telehealth: Payer: Self-pay

## 2021-10-18 NOTE — Telephone Encounter (Signed)
Called pt to schedule an appointment has not been seen in some time. No answer, lm to have her call back

## 2021-10-25 ENCOUNTER — Other Ambulatory Visit: Payer: Self-pay | Admitting: Neurology

## 2021-10-25 DIAGNOSIS — G20 Parkinson's disease: Secondary | ICD-10-CM

## 2021-10-25 DIAGNOSIS — F0670 Mild neurocognitive disorder due to known physiological condition without behavioral disturbance: Secondary | ICD-10-CM

## 2021-10-26 ENCOUNTER — Telehealth: Payer: Self-pay | Admitting: Pharmacist

## 2021-10-26 NOTE — Progress Notes (Unsigned)
    Chronic Care Management Pharmacy Assistant   Name: Carly [name]  MRN: [id number] DOB: 1945-06-05  Reason for Encounter: Monthly Medication Coordination     Medications: Outpatient Encounter Medications as of 10/26/2021  Medication Sig   AMBULATORY NON FORMULARY MEDICATION Medication Name: Stairlift G20   carbidopa-levodopa (SINEMET CR) 50-200 MG tablet TAKE ONE TABLET BY MOUTH EVERYDAY AT BEDTIME   carbidopa-levodopa (SINEMET IR) 25-100 MG tablet TAKE TWO TABLETS BY MOUTH at 6 am & 10 am AND TAKE ONE TABLET BY MOUTH at 2 pm & 6 pm   Cholecalciferol (VITAMIN D3) 100000 UNIT/GM POWD Take by mouth.   Cholecalciferol (VITAMIN D3) 50 MCG (2000 UT) TABS TAKE ONE TABLET BY MOUTH ONCE DAILY   flecainide (TAMBOCOR) 50 MG tablet Take 50 mg by mouth 2 (two) times daily.   ketorolac (ACULAR) 0.5 % ophthalmic solution    midodrine (PROAMATINE) 2.5 MG tablet Take 1 tablet (2.5 mg total) by mouth 3 (three) times daily with meals.   Vitamin D, Ergocalciferol, (DRISDOL) 1.25 MG (50000 UNIT) CAPS capsule Take 1 capsule (50,000 Units total) by mouth every 7 (seven) days.   No facility-administered encounter medications on file as of 10/26/2021.    Reviewed chart for medication changes ahead of medication coordination call.  No OVs, Consults, or hospital visits since last care coordination call/Pharmacist visit. (If appropriate, list visit date, provider name)  No medication changes indicated OR if recent visit, treatment plan here.  BP Readings from Last 3 Encounters:  01/26/21 (!) 120/56  10/21/20 130/85  09/24/20 122/75    Lab Results  Component Value Date   HGBA1C 5.8 (H) 08/29/2013     Patient obtains medications through Vials  30 Days    Last adherence delivery included: (medication name and frequency) carbidopa-levodopa (SINEMET IR) 25-100 MG tablet carbidopa-levodopa (SINEMET CR) 50-200 MG tablet Midodrine 2.5 mg 1 tablet three times daily Flecainide 50 mg 1 tablet daily   Vitamin D3 2000IU 1 tablet daily    Patient is due for next adherence delivery on: 11/07/21. Called patient and reviewed medications and coordinated delivery.   This delivery to include: carbidopa-levodopa (SINEMET IR) 25-100 MG tablet carbidopa-levodopa (SINEMET CR) 50-200 MG tablet Midodrine 2.5 mg 1 tablet three times daily Flecainide 50 mg 1 tablet daily  Vitamin D3 2000IU 1 tablet daily     Patient needs refills from specialist for: carbidopa-levodopa (SINEMET IR) 25-100 MG tablet carbidopa-levodopa (SINEMET CR) 50-200 MG tablet and Flecainide 50 mg 1 tablet daily.   Confirmed delivery date of 11/07/21, advised patient that pharmacy will contact them the morning of delivery.   Care Gaps   AWV: done 03/10/21 Colonoscopy: unknown  DM Eye Exam: N/A DM Foot Exam:N/A Microalbumin: N/A HbgAIC: N/A DEXA: 03/02/15 Mammogram: 11/13/17     Star Rating Drugs: No star rating drugs noted.   No future appointments.   Eugenie Filler, [facility] Clinical Pharmacist Assistant  [phone]

## 2021-11-24 ENCOUNTER — Other Ambulatory Visit: Payer: Self-pay | Admitting: Neurology

## 2021-11-24 DIAGNOSIS — G249 Dystonia, unspecified: Secondary | ICD-10-CM

## 2021-11-24 DIAGNOSIS — G20 Parkinson's disease: Secondary | ICD-10-CM

## 2021-11-28 ENCOUNTER — Telehealth: Payer: Self-pay | Admitting: Pharmacist

## 2021-11-28 NOTE — Progress Notes (Signed)
    Chronic Care Management Pharmacy Assistant   Name: Carly [name]  MRN: [id number] DOB: February 27, 1946   Reason for Encounter: Monthly Medication Coordination Call    Medications: Outpatient Encounter Medications as of 11/28/2021  Medication Sig   AMBULATORY NON FORMULARY MEDICATION Medication Name: Stairlift G20   carbidopa-levodopa (SINEMET CR) 50-200 MG tablet TAKE ONE TABLET BY MOUTH EVERYDAY AT BEDTIME   carbidopa-levodopa (SINEMET IR) 25-100 MG tablet TAKE TWO TABLETS BY MOUTH at 6 am & 10 am AND TAKE ONE TABLET BY MOUTH at 2 pm & 6 pm   Cholecalciferol (VITAMIN D3) 100000 UNIT/GM POWD Take by mouth.   Cholecalciferol (VITAMIN D3) 50 MCG (2000 UT) TABS TAKE ONE TABLET BY MOUTH ONCE DAILY   flecainide (TAMBOCOR) 50 MG tablet Take 50 mg by mouth 2 (two) times daily.   ketorolac (ACULAR) 0.5 % ophthalmic solution    midodrine (PROAMATINE) 2.5 MG tablet Take 1 tablet (2.5 mg total) by mouth 3 (three) times daily with meals.   Vitamin D, Ergocalciferol, (DRISDOL) 1.25 MG (50000 UNIT) CAPS capsule Take 1 capsule (50,000 Units total) by mouth every 7 (seven) days.   No facility-administered encounter medications on file as of 11/28/2021.    Reviewed chart for medication changes ahead of medication coordination call.  No OVs, Consults, or hospital visits since last care coordination call/Pharmacist visit. (If appropriate, list visit date, provider name)  No medication changes indicated OR if recent visit, treatment plan here.  BP Readings from Last 3 Encounters:  01/26/21 (!) 120/56  10/21/20 130/85  09/24/20 122/75    Lab Results  Component Value Date   HGBA1C 5.8 (H) 08/29/2013     Patient obtains medications through Vials  30 Days   Last adherence delivery included: (medication name and frequency)  carbidopa-levodopa (SINEMET IR) 25-100 MG tablet carbidopa-levodopa (SINEMET CR) 50-200 MG tablet Midodrine 2.5 mg 1 tablet three times daily Flecainide 50 mg 1 tablet  daily  Vitamin D3 2000IU 1 tablet daily    Patient is due for next adherence delivery on: 12/07/21. Called patient and reviewed medications and coordinated delivery.   This delivery to include:  carbidopa-levodopa (SINEMET IR) 25-100 MG tablet carbidopa-levodopa (SINEMET CR) 50-200 MG tablet Midodrine 2.5 mg 1 tablet three times daily Flecainide 50 mg 1 tablet daily  Vitamin D3 2000IU 1 tablet daily    Patient needs refills from specialist for 30 days (patient caregiver reports they will be seeing specialist tomorrow and possibly be changing) carbidopa-levodopa (SINEMET IR) 25-100 MG tablet carbidopa-levodopa (SINEMET CR) 50-200 MG tablet   Confirmed delivery date of 12/07/21, advised patient that pharmacy will contact them the morning of delivery.   Care Gaps   AWV: done 03/10/21 Colonoscopy: unknown  DM Eye Exam: N/A DM Foot Exam:N/A Microalbumin: N/A HbgAIC: N/A DEXA: 03/02/15 Mammogram: 11/13/17     Star Rating Drugs: No star rating drugs noted.   No future appointments.   Eugenie Filler, [facility] Clinical Pharmacist Assistant  [phone]

## 2021-11-29 DIAGNOSIS — I959 Hypotension, unspecified: Secondary | ICD-10-CM | POA: Diagnosis not present

## 2021-11-29 DIAGNOSIS — G20 Parkinson's disease: Secondary | ICD-10-CM | POA: Diagnosis not present

## 2021-11-29 DIAGNOSIS — R69 Illness, unspecified: Secondary | ICD-10-CM | POA: Diagnosis not present

## 2021-12-02 ENCOUNTER — Other Ambulatory Visit: Payer: Self-pay | Admitting: Neurology

## 2021-12-02 DIAGNOSIS — F0670 Mild neurocognitive disorder due to known physiological condition without behavioral disturbance: Secondary | ICD-10-CM

## 2021-12-02 DIAGNOSIS — G20 Parkinson's disease: Secondary | ICD-10-CM

## 2021-12-10 DIAGNOSIS — Z45018 Encounter for adjustment and management of other part of cardiac pacemaker: Secondary | ICD-10-CM | POA: Diagnosis not present

## 2021-12-26 ENCOUNTER — Telehealth: Payer: Self-pay | Admitting: Pharmacist

## 2021-12-26 NOTE — Progress Notes (Signed)
    Chronic Care Management Pharmacy Assistant   Name: Carly [name]  MRN: [id number] DOB: 1946/02/01   Reason for Encounter: Monthly Medication Coordination Call    Medications: Outpatient Encounter Medications as of 12/26/2021  Medication Sig   AMBULATORY NON FORMULARY MEDICATION Medication Name: Stairlift G20   carbidopa-levodopa (SINEMET CR) 50-200 MG tablet TAKE ONE TABLET BY MOUTH EVERYDAY AT BEDTIME   carbidopa-levodopa (SINEMET IR) 25-100 MG tablet TAKE TWO TABLETS BY MOUTH at 6 am & 10 am AND TAKE ONE TABLET BY MOUTH at 2 pm & 6 pm   Cholecalciferol (VITAMIN D3) 100000 UNIT/GM POWD Take by mouth.   Cholecalciferol (VITAMIN D3) 50 MCG (2000 UT) TABS TAKE ONE TABLET BY MOUTH ONCE DAILY   flecainide (TAMBOCOR) 50 MG tablet Take 50 mg by mouth 2 (two) times daily.   ketorolac (ACULAR) 0.5 % ophthalmic solution    midodrine (PROAMATINE) 2.5 MG tablet Take 1 tablet (2.5 mg total) by mouth 3 (three) times daily with meals.   Vitamin D, Ergocalciferol, (DRISDOL) 1.25 MG (50000 UNIT) CAPS capsule Take 1 capsule (50,000 Units total) by mouth every 7 (seven) days.   No facility-administered encounter medications on file as of 12/26/2021.    Reviewed chart for medication changes ahead of medication coordination call.  No OVs, Consults, or hospital visits since last care coordination call/Pharmacist visit. (If appropriate, list visit date, provider name)  No medication changes indicated OR if recent visit, treatment plan here.  BP Readings from Last 3 Encounters:  01/26/21 (!) 120/56  10/21/20 130/85  09/24/20 122/75    Lab Results  Component Value Date   HGBA1C 5.8 (H) 08/29/2013     Patient obtains medications through Vials  30 Days   Last adherence delivery included: (medication name and frequency)  carbidopa-levodopa (SINEMET IR) 25-100 MG tablet carbidopa-levodopa (SINEMET CR) 50-200 MG tablet Midodrine 2.5 mg 1 tablet three times daily Flecainide 50 mg 1 tablet  daily  Vitamin D3 2000IU 1 tablet daily     Patient is due for next adherence delivery on: 01/05/22. Called patient and reviewed medications and coordinated delivery.   This delivery to include:  carbidopa-levodopa (SINEMET IR) 25-100 MG tablet carbidopa-levodopa (SINEMET CR) 50-200 MG tablet Midodrine 2.5 mg 1 tablet three times daily Flecainide 50 mg 1 tablet daily  Vitamin D3 2000IU 1 tablet daily     Patient needs refills: None.  Confirmed delivery date of 01/05/22, advised patient that pharmacy will contact them the morning of delivery.    Care Gaps   AWV: done 03/10/21 Colonoscopy: unknown  DM Eye Exam: N/A DM Foot Exam:N/A Microalbumin: N/A HbgAIC: N/A DEXA: 03/02/15 Mammogram: 11/13/17     Star Rating Drugs: No star rating drugs noted.   No future appointments.   [name], [facility] Clinical Pharmacist Assistant  [phone]

## 2022-01-10 DIAGNOSIS — Z79899 Other long term (current) drug therapy: Secondary | ICD-10-CM | POA: Diagnosis not present

## 2022-01-10 DIAGNOSIS — I951 Orthostatic hypotension: Secondary | ICD-10-CM | POA: Diagnosis not present

## 2022-01-10 DIAGNOSIS — R69 Illness, unspecified: Secondary | ICD-10-CM | POA: Diagnosis not present

## 2022-01-10 DIAGNOSIS — G20A1 Parkinson's disease without dyskinesia, without mention of fluctuations: Secondary | ICD-10-CM | POA: Diagnosis not present

## 2022-01-11 DIAGNOSIS — Z9841 Cataract extraction status, right eye: Secondary | ICD-10-CM | POA: Diagnosis not present

## 2022-01-11 DIAGNOSIS — I959 Hypotension, unspecified: Secondary | ICD-10-CM | POA: Diagnosis not present

## 2022-01-11 DIAGNOSIS — Z950 Presence of cardiac pacemaker: Secondary | ICD-10-CM | POA: Diagnosis not present

## 2022-01-11 DIAGNOSIS — R69 Illness, unspecified: Secondary | ICD-10-CM | POA: Diagnosis not present

## 2022-01-11 DIAGNOSIS — I10 Essential (primary) hypertension: Secondary | ICD-10-CM | POA: Diagnosis not present

## 2022-01-11 DIAGNOSIS — G20C Parkinsonism, unspecified: Secondary | ICD-10-CM | POA: Diagnosis not present

## 2022-01-11 DIAGNOSIS — R1310 Dysphagia, unspecified: Secondary | ICD-10-CM | POA: Diagnosis not present

## 2022-01-11 DIAGNOSIS — Z87440 Personal history of urinary (tract) infections: Secondary | ICD-10-CM | POA: Diagnosis not present

## 2022-01-11 DIAGNOSIS — H409 Unspecified glaucoma: Secondary | ICD-10-CM | POA: Diagnosis not present

## 2022-01-11 DIAGNOSIS — I480 Paroxysmal atrial fibrillation: Secondary | ICD-10-CM | POA: Diagnosis not present

## 2022-01-11 DIAGNOSIS — F411 Generalized anxiety disorder: Secondary | ICD-10-CM | POA: Diagnosis not present

## 2022-01-11 DIAGNOSIS — Z96652 Presence of left artificial knee joint: Secondary | ICD-10-CM | POA: Diagnosis not present

## 2022-01-11 DIAGNOSIS — Z9181 History of falling: Secondary | ICD-10-CM | POA: Diagnosis not present

## 2022-01-23 ENCOUNTER — Other Ambulatory Visit: Payer: Self-pay | Admitting: Neurology

## 2022-01-23 DIAGNOSIS — G903 Multi-system degeneration of the autonomic nervous system: Secondary | ICD-10-CM

## 2022-01-26 ENCOUNTER — Telehealth: Payer: Self-pay | Admitting: Pharmacist

## 2022-01-26 NOTE — Progress Notes (Signed)
    Chronic Care Management Pharmacy Assistant   Name: Carly [name]  MRN: [id number] DOB: [birth year]  Reason for Encounter: Monthly Medication Coordination Call      Medications: Outpatient Encounter Medications as of 01/26/2022  Medication Sig   AMBULATORY NON FORMULARY MEDICATION Medication Name: Stairlift G20   carbidopa-levodopa (SINEMET CR) 50-200 MG tablet TAKE ONE TABLET BY MOUTH EVERYDAY AT BEDTIME   carbidopa-levodopa (SINEMET IR) 25-100 MG tablet TAKE TWO TABLETS BY MOUTH at 6 am & 10 am AND TAKE ONE TABLET BY MOUTH at 2 pm & 6 pm   Cholecalciferol (VITAMIN D3) 100000 UNIT/GM POWD Take by mouth.   Cholecalciferol (VITAMIN D3) 50 MCG (2000 UT) TABS TAKE ONE TABLET BY MOUTH ONCE DAILY   flecainide (TAMBOCOR) 50 MG tablet Take 50 mg by mouth 2 (two) times daily.   ketorolac (ACULAR) 0.5 % ophthalmic solution    midodrine (PROAMATINE) 2.5 MG tablet Take 1 tablet (2.5 mg total) by mouth 3 (three) times daily with meals.   Vitamin D, Ergocalciferol, (DRISDOL) 1.25 MG (50000 UNIT) CAPS capsule Take 1 capsule (50,000 Units total) by mouth every 7 (seven) days.   No facility-administered encounter medications on file as of 01/26/2022.    Reviewed chart for medication changes ahead of medication coordination call.  No OVs, Consults, or hospital visits since last care coordination call/Pharmacist visit. (If appropriate, list visit date, provider name)  No medication changes indicated OR if recent visit, treatment plan here.  BP Readings from Last 3 Encounters:  01/26/21 (!) 120/56  10/21/20 130/85  09/24/20 122/75    Lab Results  Component Value Date   HGBA1C 5.8 (H) 08/29/2013     Patient obtains medications through Vials  30 Days   Last adherence delivery included: (medication name and frequency)  carbidopa-levodopa (SINEMET IR) 25-100 MG tablet carbidopa-levodopa (SINEMET CR) 50-200 MG tablet Midodrine 2.5 mg 1 tablet three times daily Flecainide 50 mg 1 tablet  daily  Vitamin D3 2000IU 1 tablet daily    Patient is due for next adherence delivery on: 02/06/22. Called patient and reviewed medications and coordinated delivery.  This delivery to include:  Flecainide 50 mg 1 tablet daily  Vitamin D3 2000IU 1 tablet daily    Patient needs refills: None .  Patient got filled at [facility] for 90 days (new specialist provider) carbidopa-levodopa (SINEMET IR) 25-100 MG tablet carbidopa-levodopa (SINEMET CR) 50-200 MG tablet Midodrine 2.5 mg 1 tablet three times daily   Confirmed delivery date of 02/06/22, advised patient that pharmacy will contact them the morning of delivery.   Care Gaps   AWV: done 03/10/21 Colonoscopy: unknown  DM Eye Exam: N/A DM Foot Exam:N/A Microalbumin: N/A HbgAIC: N/A DEXA: 03/02/15 Mammogram: 11/13/17     Star Rating Drugs: No star rating drugs noted.   No future appointments.   Carly [name], [facility] Clinical Pharmacist Assistant  [phone]

## 2022-02-13 DIAGNOSIS — I951 Orthostatic hypotension: Secondary | ICD-10-CM | POA: Diagnosis not present

## 2022-02-13 DIAGNOSIS — Z950 Presence of cardiac pacemaker: Secondary | ICD-10-CM | POA: Diagnosis not present

## 2022-02-13 DIAGNOSIS — M8580 Other specified disorders of bone density and structure, unspecified site: Secondary | ICD-10-CM | POA: Diagnosis not present

## 2022-02-13 DIAGNOSIS — E538 Deficiency of other specified B group vitamins: Secondary | ICD-10-CM | POA: Diagnosis not present

## 2022-02-13 DIAGNOSIS — R7989 Other specified abnormal findings of blood chemistry: Secondary | ICD-10-CM | POA: Diagnosis not present

## 2022-02-13 DIAGNOSIS — R739 Hyperglycemia, unspecified: Secondary | ICD-10-CM | POA: Diagnosis not present

## 2022-02-13 DIAGNOSIS — I499 Cardiac arrhythmia, unspecified: Secondary | ICD-10-CM | POA: Diagnosis not present

## 2022-02-13 DIAGNOSIS — D696 Thrombocytopenia, unspecified: Secondary | ICD-10-CM | POA: Diagnosis not present

## 2022-02-13 DIAGNOSIS — R69 Illness, unspecified: Secondary | ICD-10-CM | POA: Diagnosis not present

## 2022-02-13 DIAGNOSIS — I480 Paroxysmal atrial fibrillation: Secondary | ICD-10-CM | POA: Diagnosis not present

## 2022-02-13 DIAGNOSIS — G20A1 Parkinson's disease without dyskinesia, without mention of fluctuations: Secondary | ICD-10-CM | POA: Diagnosis not present

## 2022-02-13 DIAGNOSIS — Z780 Asymptomatic menopausal state: Secondary | ICD-10-CM | POA: Diagnosis not present

## 2022-02-13 DIAGNOSIS — D72819 Decreased white blood cell count, unspecified: Secondary | ICD-10-CM | POA: Diagnosis not present

## 2022-02-14 DIAGNOSIS — G20C Parkinsonism, unspecified: Secondary | ICD-10-CM | POA: Diagnosis not present

## 2022-02-14 DIAGNOSIS — I959 Hypotension, unspecified: Secondary | ICD-10-CM | POA: Diagnosis not present

## 2022-02-14 DIAGNOSIS — R69 Illness, unspecified: Secondary | ICD-10-CM | POA: Diagnosis not present

## 2022-02-14 DIAGNOSIS — H409 Unspecified glaucoma: Secondary | ICD-10-CM | POA: Diagnosis not present

## 2022-02-14 DIAGNOSIS — Z87440 Personal history of urinary (tract) infections: Secondary | ICD-10-CM | POA: Diagnosis not present

## 2022-02-14 DIAGNOSIS — Z9181 History of falling: Secondary | ICD-10-CM | POA: Diagnosis not present

## 2022-02-14 DIAGNOSIS — I10 Essential (primary) hypertension: Secondary | ICD-10-CM | POA: Diagnosis not present

## 2022-02-14 DIAGNOSIS — R1310 Dysphagia, unspecified: Secondary | ICD-10-CM | POA: Diagnosis not present

## 2022-02-14 DIAGNOSIS — Z950 Presence of cardiac pacemaker: Secondary | ICD-10-CM | POA: Diagnosis not present

## 2022-02-14 DIAGNOSIS — Z9841 Cataract extraction status, right eye: Secondary | ICD-10-CM | POA: Diagnosis not present

## 2022-02-14 DIAGNOSIS — I480 Paroxysmal atrial fibrillation: Secondary | ICD-10-CM | POA: Diagnosis not present

## 2022-02-14 DIAGNOSIS — Z96652 Presence of left artificial knee joint: Secondary | ICD-10-CM | POA: Diagnosis not present

## 2022-02-17 DIAGNOSIS — R69 Illness, unspecified: Secondary | ICD-10-CM | POA: Diagnosis not present

## 2022-02-17 DIAGNOSIS — I959 Hypotension, unspecified: Secondary | ICD-10-CM | POA: Diagnosis not present

## 2022-02-17 DIAGNOSIS — Z87440 Personal history of urinary (tract) infections: Secondary | ICD-10-CM | POA: Diagnosis not present

## 2022-02-17 DIAGNOSIS — Z950 Presence of cardiac pacemaker: Secondary | ICD-10-CM | POA: Diagnosis not present

## 2022-02-17 DIAGNOSIS — G20C Parkinsonism, unspecified: Secondary | ICD-10-CM | POA: Diagnosis not present

## 2022-02-17 DIAGNOSIS — R1310 Dysphagia, unspecified: Secondary | ICD-10-CM | POA: Diagnosis not present

## 2022-02-17 DIAGNOSIS — Z9841 Cataract extraction status, right eye: Secondary | ICD-10-CM | POA: Diagnosis not present

## 2022-02-17 DIAGNOSIS — I480 Paroxysmal atrial fibrillation: Secondary | ICD-10-CM | POA: Diagnosis not present

## 2022-02-17 DIAGNOSIS — H409 Unspecified glaucoma: Secondary | ICD-10-CM | POA: Diagnosis not present

## 2022-02-17 DIAGNOSIS — Z9181 History of falling: Secondary | ICD-10-CM | POA: Diagnosis not present

## 2022-02-17 DIAGNOSIS — I10 Essential (primary) hypertension: Secondary | ICD-10-CM | POA: Diagnosis not present

## 2022-02-17 DIAGNOSIS — Z96652 Presence of left artificial knee joint: Secondary | ICD-10-CM | POA: Diagnosis not present

## 2022-02-20 DIAGNOSIS — I959 Hypotension, unspecified: Secondary | ICD-10-CM | POA: Diagnosis not present

## 2022-02-20 DIAGNOSIS — H409 Unspecified glaucoma: Secondary | ICD-10-CM | POA: Diagnosis not present

## 2022-02-20 DIAGNOSIS — I480 Paroxysmal atrial fibrillation: Secondary | ICD-10-CM | POA: Diagnosis not present

## 2022-02-20 DIAGNOSIS — R1310 Dysphagia, unspecified: Secondary | ICD-10-CM | POA: Diagnosis not present

## 2022-02-20 DIAGNOSIS — I10 Essential (primary) hypertension: Secondary | ICD-10-CM | POA: Diagnosis not present

## 2022-02-20 DIAGNOSIS — G20C Parkinsonism, unspecified: Secondary | ICD-10-CM | POA: Diagnosis not present

## 2022-02-20 DIAGNOSIS — R69 Illness, unspecified: Secondary | ICD-10-CM | POA: Diagnosis not present

## 2022-02-20 DIAGNOSIS — Z96652 Presence of left artificial knee joint: Secondary | ICD-10-CM | POA: Diagnosis not present

## 2022-02-20 DIAGNOSIS — Z9841 Cataract extraction status, right eye: Secondary | ICD-10-CM | POA: Diagnosis not present

## 2022-02-20 DIAGNOSIS — Z87440 Personal history of urinary (tract) infections: Secondary | ICD-10-CM | POA: Diagnosis not present

## 2022-02-20 DIAGNOSIS — Z9181 History of falling: Secondary | ICD-10-CM | POA: Diagnosis not present

## 2022-02-20 DIAGNOSIS — Z950 Presence of cardiac pacemaker: Secondary | ICD-10-CM | POA: Diagnosis not present

## 2022-02-21 ENCOUNTER — Telehealth: Payer: Self-pay | Admitting: Pharmacist

## 2022-02-21 NOTE — Progress Notes (Signed)
    Chronic Care Management Pharmacy Assistant   Name: CAMYAH PULTZ  MRN: [id number] DOB: [birth year]   Reason for Encounter: Monthly Medication Coordination Call      Medications: Outpatient Encounter Medications as of 02/21/2022  Medication Sig   AMBULATORY NON FORMULARY MEDICATION Medication Name: Stairlift G20   carbidopa-levodopa (SINEMET CR) 50-200 MG tablet TAKE ONE TABLET BY MOUTH EVERYDAY AT BEDTIME   carbidopa-levodopa (SINEMET IR) 25-100 MG tablet TAKE TWO TABLETS BY MOUTH at 6 am & 10 am AND TAKE ONE TABLET BY MOUTH at 2 pm & 6 pm   Cholecalciferol (VITAMIN D3) 100000 UNIT/GM POWD Take by mouth.   Cholecalciferol (VITAMIN D3) 50 MCG (2000 UT) TABS TAKE ONE TABLET BY MOUTH ONCE DAILY   flecainide (TAMBOCOR) 50 MG tablet Take 50 mg by mouth 2 (two) times daily.   ketorolac (ACULAR) 0.5 % ophthalmic solution    midodrine (PROAMATINE) 2.5 MG tablet Take 1 tablet (2.5 mg total) by mouth 3 (three) times daily with meals.   Vitamin D, Ergocalciferol, (DRISDOL) 1.25 MG (50000 UNIT) CAPS capsule Take 1 capsule (50,000 Units total) by mouth every 7 (seven) days.   No facility-administered encounter medications on file as of 02/21/2022.   Reviewed chart for medication changes ahead of medication coordination call.  No OVs, Consults, or hospital visits since last care coordination call/Pharmacist visit. (If appropriate, list visit date, provider name)  No medication changes indicated OR if recent visit, treatment plan here.  BP Readings from Last 3 Encounters:  01/26/21 (!) 120/56  10/21/20 130/85  09/24/20 122/75    Lab Results  Component Value Date   HGBA1C 5.8 (H) 08/29/2013     Patient obtains medications through Vials  30 Days   Last adherence delivery included: (medication name and frequency)  Flecainide 50 mg 1 tablet daily  Vitamin D3 2000IU 1 tablet daily  Patient is due for next adherence delivery on: 03/07/22. Called patient and reviewed medications and  coordinated delivery.  This delivery to include:  Flecainide 50 mg 1 tablet daily  Vitamin D3 2000IU 1 tablet daily   Patient needs refills for: None  Confirmed delivery date of 03/07/22, advised patient that pharmacy will contact them the morning of delivery.   Care Gaps   AWV: done 03/10/21 Colonoscopy: unknown  DM Eye Exam: N/A DM Foot Exam:N/A Microalbumin: N/A HbgAIC: N/A DEXA: 03/02/15 Mammogram: 11/13/17     Star Rating Drugs: No star rating drugs noted.     No future appointments.   Eugenie Filler, [facility] [place] Clinical Pharmacist Assistant  [phone]

## 2022-02-22 DIAGNOSIS — Z950 Presence of cardiac pacemaker: Secondary | ICD-10-CM | POA: Diagnosis not present

## 2022-02-22 DIAGNOSIS — G20C Parkinsonism, unspecified: Secondary | ICD-10-CM | POA: Diagnosis not present

## 2022-02-22 DIAGNOSIS — I480 Paroxysmal atrial fibrillation: Secondary | ICD-10-CM | POA: Diagnosis not present

## 2022-02-22 DIAGNOSIS — Z87440 Personal history of urinary (tract) infections: Secondary | ICD-10-CM | POA: Diagnosis not present

## 2022-02-22 DIAGNOSIS — I10 Essential (primary) hypertension: Secondary | ICD-10-CM | POA: Diagnosis not present

## 2022-02-22 DIAGNOSIS — R1310 Dysphagia, unspecified: Secondary | ICD-10-CM | POA: Diagnosis not present

## 2022-02-22 DIAGNOSIS — R69 Illness, unspecified: Secondary | ICD-10-CM | POA: Diagnosis not present

## 2022-02-22 DIAGNOSIS — Z96652 Presence of left artificial knee joint: Secondary | ICD-10-CM | POA: Diagnosis not present

## 2022-02-22 DIAGNOSIS — I959 Hypotension, unspecified: Secondary | ICD-10-CM | POA: Diagnosis not present

## 2022-02-22 DIAGNOSIS — Z9841 Cataract extraction status, right eye: Secondary | ICD-10-CM | POA: Diagnosis not present

## 2022-02-22 DIAGNOSIS — H409 Unspecified glaucoma: Secondary | ICD-10-CM | POA: Diagnosis not present

## 2022-02-22 DIAGNOSIS — Z9181 History of falling: Secondary | ICD-10-CM | POA: Diagnosis not present

## 2022-02-27 DIAGNOSIS — Z9181 History of falling: Secondary | ICD-10-CM | POA: Diagnosis not present

## 2022-02-27 DIAGNOSIS — I480 Paroxysmal atrial fibrillation: Secondary | ICD-10-CM | POA: Diagnosis not present

## 2022-02-27 DIAGNOSIS — R69 Illness, unspecified: Secondary | ICD-10-CM | POA: Diagnosis not present

## 2022-02-27 DIAGNOSIS — G20C Parkinsonism, unspecified: Secondary | ICD-10-CM | POA: Diagnosis not present

## 2022-02-27 DIAGNOSIS — R1310 Dysphagia, unspecified: Secondary | ICD-10-CM | POA: Diagnosis not present

## 2022-02-27 DIAGNOSIS — Z96652 Presence of left artificial knee joint: Secondary | ICD-10-CM | POA: Diagnosis not present

## 2022-02-27 DIAGNOSIS — Z9841 Cataract extraction status, right eye: Secondary | ICD-10-CM | POA: Diagnosis not present

## 2022-02-27 DIAGNOSIS — Z950 Presence of cardiac pacemaker: Secondary | ICD-10-CM | POA: Diagnosis not present

## 2022-02-27 DIAGNOSIS — H409 Unspecified glaucoma: Secondary | ICD-10-CM | POA: Diagnosis not present

## 2022-02-27 DIAGNOSIS — I959 Hypotension, unspecified: Secondary | ICD-10-CM | POA: Diagnosis not present

## 2022-02-27 DIAGNOSIS — Z87440 Personal history of urinary (tract) infections: Secondary | ICD-10-CM | POA: Diagnosis not present

## 2022-02-27 DIAGNOSIS — I10 Essential (primary) hypertension: Secondary | ICD-10-CM | POA: Diagnosis not present

## 2022-02-28 DIAGNOSIS — Z950 Presence of cardiac pacemaker: Secondary | ICD-10-CM | POA: Diagnosis not present

## 2022-02-28 DIAGNOSIS — G20A1 Parkinson's disease without dyskinesia, without mention of fluctuations: Secondary | ICD-10-CM | POA: Diagnosis not present

## 2022-02-28 DIAGNOSIS — I480 Paroxysmal atrial fibrillation: Secondary | ICD-10-CM | POA: Diagnosis not present

## 2022-02-28 DIAGNOSIS — I495 Sick sinus syndrome: Secondary | ICD-10-CM | POA: Diagnosis not present

## 2022-02-28 DIAGNOSIS — I493 Ventricular premature depolarization: Secondary | ICD-10-CM | POA: Diagnosis not present

## 2022-02-28 DIAGNOSIS — R69 Illness, unspecified: Secondary | ICD-10-CM | POA: Diagnosis not present

## 2022-02-28 DIAGNOSIS — T82897A Other specified complication of cardiac prosthetic devices, implants and grafts, initial encounter: Secondary | ICD-10-CM | POA: Diagnosis not present

## 2022-03-01 DIAGNOSIS — R1310 Dysphagia, unspecified: Secondary | ICD-10-CM | POA: Diagnosis not present

## 2022-03-01 DIAGNOSIS — I959 Hypotension, unspecified: Secondary | ICD-10-CM | POA: Diagnosis not present

## 2022-03-01 DIAGNOSIS — H409 Unspecified glaucoma: Secondary | ICD-10-CM | POA: Diagnosis not present

## 2022-03-01 DIAGNOSIS — I10 Essential (primary) hypertension: Secondary | ICD-10-CM | POA: Diagnosis not present

## 2022-03-01 DIAGNOSIS — G20C Parkinsonism, unspecified: Secondary | ICD-10-CM | POA: Diagnosis not present

## 2022-03-01 DIAGNOSIS — Z96652 Presence of left artificial knee joint: Secondary | ICD-10-CM | POA: Diagnosis not present

## 2022-03-01 DIAGNOSIS — R69 Illness, unspecified: Secondary | ICD-10-CM | POA: Diagnosis not present

## 2022-03-01 DIAGNOSIS — I480 Paroxysmal atrial fibrillation: Secondary | ICD-10-CM | POA: Diagnosis not present

## 2022-03-01 DIAGNOSIS — Z950 Presence of cardiac pacemaker: Secondary | ICD-10-CM | POA: Diagnosis not present

## 2022-03-01 DIAGNOSIS — Z9841 Cataract extraction status, right eye: Secondary | ICD-10-CM | POA: Diagnosis not present

## 2022-03-01 DIAGNOSIS — Z87440 Personal history of urinary (tract) infections: Secondary | ICD-10-CM | POA: Diagnosis not present

## 2022-03-01 DIAGNOSIS — Z9181 History of falling: Secondary | ICD-10-CM | POA: Diagnosis not present

## 2022-03-06 DIAGNOSIS — I959 Hypotension, unspecified: Secondary | ICD-10-CM | POA: Diagnosis not present

## 2022-03-06 DIAGNOSIS — Z87440 Personal history of urinary (tract) infections: Secondary | ICD-10-CM | POA: Diagnosis not present

## 2022-03-06 DIAGNOSIS — Z96652 Presence of left artificial knee joint: Secondary | ICD-10-CM | POA: Diagnosis not present

## 2022-03-06 DIAGNOSIS — G20C Parkinsonism, unspecified: Secondary | ICD-10-CM | POA: Diagnosis not present

## 2022-03-06 DIAGNOSIS — Z950 Presence of cardiac pacemaker: Secondary | ICD-10-CM | POA: Diagnosis not present

## 2022-03-06 DIAGNOSIS — Z9841 Cataract extraction status, right eye: Secondary | ICD-10-CM | POA: Diagnosis not present

## 2022-03-06 DIAGNOSIS — Z9181 History of falling: Secondary | ICD-10-CM | POA: Diagnosis not present

## 2022-03-06 DIAGNOSIS — I480 Paroxysmal atrial fibrillation: Secondary | ICD-10-CM | POA: Diagnosis not present

## 2022-03-06 DIAGNOSIS — R69 Illness, unspecified: Secondary | ICD-10-CM | POA: Diagnosis not present

## 2022-03-06 DIAGNOSIS — H409 Unspecified glaucoma: Secondary | ICD-10-CM | POA: Diagnosis not present

## 2022-03-06 DIAGNOSIS — R1310 Dysphagia, unspecified: Secondary | ICD-10-CM | POA: Diagnosis not present

## 2022-03-06 DIAGNOSIS — I10 Essential (primary) hypertension: Secondary | ICD-10-CM | POA: Diagnosis not present

## 2022-03-08 DIAGNOSIS — H401134 Primary open-angle glaucoma, bilateral, indeterminate stage: Secondary | ICD-10-CM | POA: Diagnosis not present

## 2022-03-08 DIAGNOSIS — H2513 Age-related nuclear cataract, bilateral: Secondary | ICD-10-CM | POA: Diagnosis not present

## 2022-03-08 DIAGNOSIS — Z45010 Encounter for checking and testing of cardiac pacemaker pulse generator [battery]: Secondary | ICD-10-CM | POA: Diagnosis not present

## 2022-03-08 DIAGNOSIS — Z961 Presence of intraocular lens: Secondary | ICD-10-CM | POA: Diagnosis not present

## 2022-03-09 DIAGNOSIS — I959 Hypotension, unspecified: Secondary | ICD-10-CM | POA: Diagnosis not present

## 2022-03-09 DIAGNOSIS — Z96652 Presence of left artificial knee joint: Secondary | ICD-10-CM | POA: Diagnosis not present

## 2022-03-09 DIAGNOSIS — Z87440 Personal history of urinary (tract) infections: Secondary | ICD-10-CM | POA: Diagnosis not present

## 2022-03-09 DIAGNOSIS — Z9181 History of falling: Secondary | ICD-10-CM | POA: Diagnosis not present

## 2022-03-09 DIAGNOSIS — Z9841 Cataract extraction status, right eye: Secondary | ICD-10-CM | POA: Diagnosis not present

## 2022-03-09 DIAGNOSIS — I10 Essential (primary) hypertension: Secondary | ICD-10-CM | POA: Diagnosis not present

## 2022-03-09 DIAGNOSIS — R1310 Dysphagia, unspecified: Secondary | ICD-10-CM | POA: Diagnosis not present

## 2022-03-09 DIAGNOSIS — Z950 Presence of cardiac pacemaker: Secondary | ICD-10-CM | POA: Diagnosis not present

## 2022-03-09 DIAGNOSIS — H409 Unspecified glaucoma: Secondary | ICD-10-CM | POA: Diagnosis not present

## 2022-03-09 DIAGNOSIS — G20C Parkinsonism, unspecified: Secondary | ICD-10-CM | POA: Diagnosis not present

## 2022-03-09 DIAGNOSIS — R69 Illness, unspecified: Secondary | ICD-10-CM | POA: Diagnosis not present

## 2022-03-09 DIAGNOSIS — I480 Paroxysmal atrial fibrillation: Secondary | ICD-10-CM | POA: Diagnosis not present

## 2022-03-13 ENCOUNTER — Telehealth: Payer: Self-pay | Admitting: Family Medicine

## 2022-03-13 NOTE — Telephone Encounter (Signed)
I spoke with patient's daughter-in-law.  Patient has switched PCP to Dr. Ermalinda Memos. Patient was very happy with Dr.[name], but the office was too far away from her home. Please remove PCP.

## 2022-05-15 DIAGNOSIS — R5383 Other fatigue: Secondary | ICD-10-CM | POA: Diagnosis not present

## 2022-05-15 DIAGNOSIS — R55 Syncope and collapse: Secondary | ICD-10-CM | POA: Diagnosis not present

## 2022-05-15 DIAGNOSIS — Z7689 Persons encountering health services in other specified circumstances: Secondary | ICD-10-CM | POA: Diagnosis not present

## 2022-05-15 DIAGNOSIS — R69 Illness, unspecified: Secondary | ICD-10-CM | POA: Diagnosis not present

## 2022-05-15 DIAGNOSIS — I480 Paroxysmal atrial fibrillation: Secondary | ICD-10-CM | POA: Diagnosis not present

## 2022-05-15 DIAGNOSIS — G20A1 Parkinson's disease without dyskinesia, without mention of fluctuations: Secondary | ICD-10-CM | POA: Diagnosis not present

## 2022-05-15 DIAGNOSIS — Z1330 Encounter for screening examination for mental health and behavioral disorders, unspecified: Secondary | ICD-10-CM | POA: Diagnosis not present

## 2022-05-15 DIAGNOSIS — G903 Multi-system degeneration of the autonomic nervous system: Secondary | ICD-10-CM | POA: Diagnosis not present

## 2022-05-15 DIAGNOSIS — R42 Dizziness and giddiness: Secondary | ICD-10-CM | POA: Diagnosis not present

## 2022-05-17 DIAGNOSIS — R944 Abnormal results of kidney function studies: Secondary | ICD-10-CM | POA: Diagnosis not present

## 2022-05-25 DIAGNOSIS — R195 Other fecal abnormalities: Secondary | ICD-10-CM | POA: Diagnosis not present

## 2022-05-25 DIAGNOSIS — R944 Abnormal results of kidney function studies: Secondary | ICD-10-CM | POA: Diagnosis not present

## 2022-05-26 DIAGNOSIS — R944 Abnormal results of kidney function studies: Secondary | ICD-10-CM | POA: Diagnosis not present

## 2022-06-01 DIAGNOSIS — R195 Other fecal abnormalities: Secondary | ICD-10-CM | POA: Diagnosis not present

## 2022-06-07 DIAGNOSIS — I480 Paroxysmal atrial fibrillation: Secondary | ICD-10-CM | POA: Diagnosis not present

## 2022-06-07 DIAGNOSIS — I493 Ventricular premature depolarization: Secondary | ICD-10-CM | POA: Diagnosis not present

## 2022-06-07 DIAGNOSIS — Z45018 Encounter for adjustment and management of other part of cardiac pacemaker: Secondary | ICD-10-CM | POA: Diagnosis not present

## 2022-06-07 DIAGNOSIS — I491 Atrial premature depolarization: Secondary | ICD-10-CM | POA: Diagnosis not present

## 2022-06-15 DIAGNOSIS — L89156 Pressure-induced deep tissue damage of sacral region: Secondary | ICD-10-CM | POA: Diagnosis not present

## 2022-06-15 DIAGNOSIS — Z950 Presence of cardiac pacemaker: Secondary | ICD-10-CM | POA: Diagnosis not present

## 2022-06-15 DIAGNOSIS — G20A1 Parkinson's disease without dyskinesia, without mention of fluctuations: Secondary | ICD-10-CM | POA: Diagnosis not present

## 2022-06-15 DIAGNOSIS — G9340 Encephalopathy, unspecified: Secondary | ICD-10-CM | POA: Diagnosis not present

## 2022-06-15 DIAGNOSIS — I4892 Unspecified atrial flutter: Secondary | ICD-10-CM | POA: Diagnosis not present

## 2022-06-15 DIAGNOSIS — I700 Atherosclerosis of aorta: Secondary | ICD-10-CM | POA: Diagnosis not present

## 2022-06-15 DIAGNOSIS — A419 Sepsis, unspecified organism: Secondary | ICD-10-CM | POA: Diagnosis not present

## 2022-06-15 DIAGNOSIS — R400 Somnolence: Secondary | ICD-10-CM | POA: Diagnosis not present

## 2022-06-15 DIAGNOSIS — G459 Transient cerebral ischemic attack, unspecified: Secondary | ICD-10-CM | POA: Diagnosis not present

## 2022-06-15 DIAGNOSIS — Z7401 Bed confinement status: Secondary | ICD-10-CM | POA: Diagnosis not present

## 2022-06-15 DIAGNOSIS — I493 Ventricular premature depolarization: Secondary | ICD-10-CM | POA: Diagnosis not present

## 2022-06-15 DIAGNOSIS — Z66 Do not resuscitate: Secondary | ICD-10-CM | POA: Diagnosis not present

## 2022-06-15 DIAGNOSIS — R69 Illness, unspecified: Secondary | ICD-10-CM | POA: Diagnosis not present

## 2022-06-15 DIAGNOSIS — K5900 Constipation, unspecified: Secondary | ICD-10-CM | POA: Diagnosis not present

## 2022-06-15 DIAGNOSIS — N179 Acute kidney failure, unspecified: Secondary | ICD-10-CM | POA: Diagnosis not present

## 2022-06-15 DIAGNOSIS — R509 Fever, unspecified: Secondary | ICD-10-CM | POA: Diagnosis not present

## 2022-06-15 DIAGNOSIS — Z515 Encounter for palliative care: Secondary | ICD-10-CM | POA: Diagnosis not present

## 2022-06-15 DIAGNOSIS — I4439 Other atrioventricular block: Secondary | ICD-10-CM | POA: Diagnosis not present

## 2022-06-15 DIAGNOSIS — L89626 Pressure-induced deep tissue damage of left heel: Secondary | ICD-10-CM | POA: Diagnosis not present

## 2022-06-15 DIAGNOSIS — R4182 Altered mental status, unspecified: Secondary | ICD-10-CM | POA: Diagnosis not present

## 2022-06-15 DIAGNOSIS — R0689 Other abnormalities of breathing: Secondary | ICD-10-CM | POA: Diagnosis not present

## 2022-06-15 DIAGNOSIS — I480 Paroxysmal atrial fibrillation: Secondary | ICD-10-CM | POA: Diagnosis not present

## 2022-06-20 DIAGNOSIS — Z743 Need for continuous supervision: Secondary | ICD-10-CM | POA: Diagnosis not present

## 2022-06-20 DIAGNOSIS — I10 Essential (primary) hypertension: Secondary | ICD-10-CM | POA: Diagnosis not present

## 2022-06-20 DIAGNOSIS — R4182 Altered mental status, unspecified: Secondary | ICD-10-CM | POA: Diagnosis not present

## 2022-10-08 DIAGNOSIS — Z45018 Encounter for adjustment and management of other part of cardiac pacemaker: Secondary | ICD-10-CM | POA: Diagnosis not present

## 2022-11-02 DEATH — deceased

## 2022-12-05 IMAGING — RF DG SWALLOWING FUNCTION
8 series · 24 of 24 positions shown · non-contrast
Comparison: None.

CLINICAL DATA: Dysphagia.

EXAM:
MODIFIED BARIUM SWALLOW
TECHNIQUE: Different consistencies of barium were administered orally to the
patient by the Speech Pathologist. Imaging of the pharynx was
performed in the lateral projection. The radiologist was present in
the fluoroscopy room for this study, providing personal supervision.
FLUOROSCOPY TIME:  Fluoroscopy Time:  1 minutes and 6 seconds
Radiation Exposure Index (if provided by the fluoroscopic device):
4.3
Number of Acquired Spot Images: 0

[Series 1: cp_standard · 0.34mm/px · 3 of 70 frames shown (1 of 8)]
[frame 11/70]
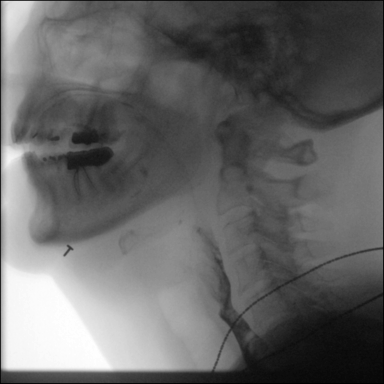
[frame 33/70]
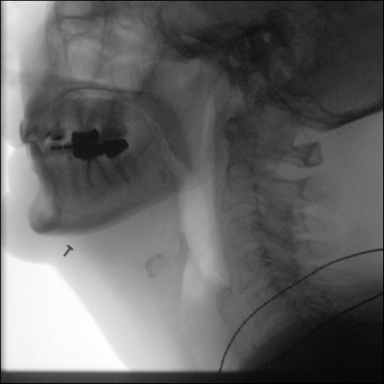
[frame 60/70]
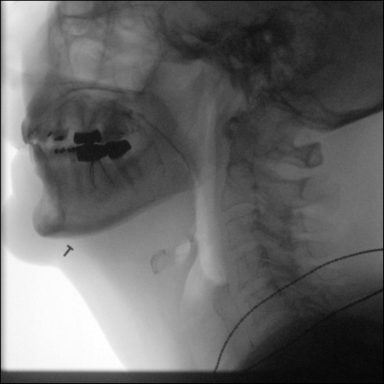

[Series 2: cp_standard · 0.34mm/px · 3 of 254 frames shown (2 of 8)]
[frame 16/254]
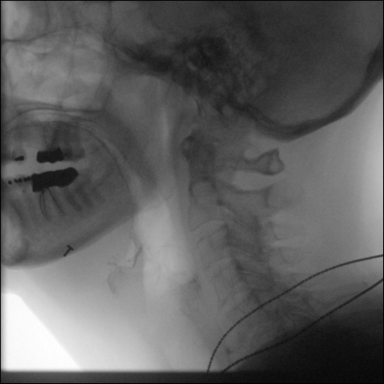
[frame 39/254]
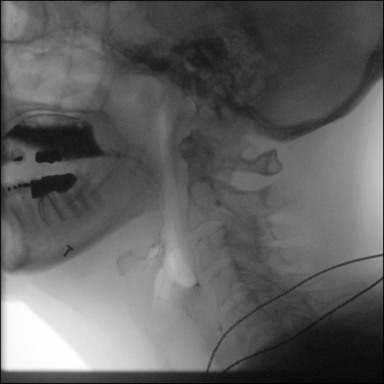
[frame 216/254]
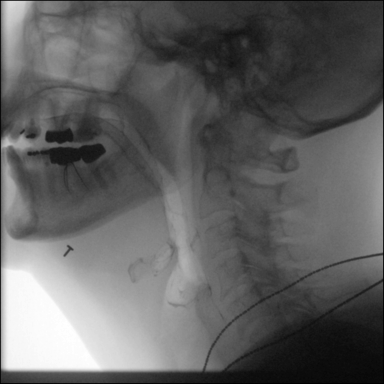

[Series 3: cp_standard · 0.34mm/px · 3 of 84 frames shown (3 of 8)]
[frame 13/84]
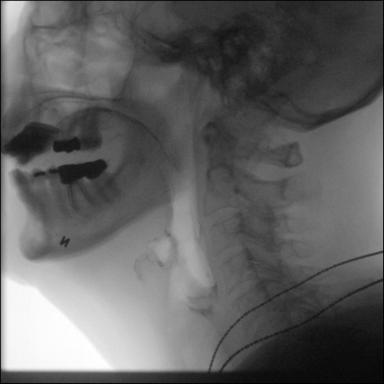
[frame 22/84]
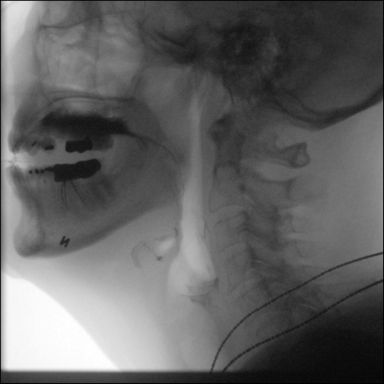
[frame 72/84]
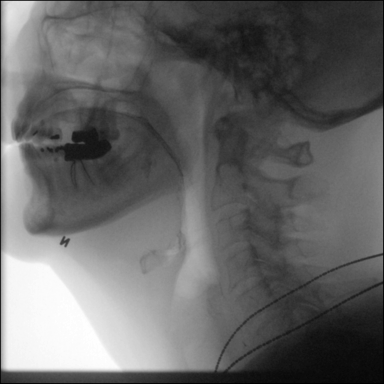

[Series 4: cp_standard · 0.34mm/px · 3 of 56 frames shown (4 of 8)]
[frame 1/56]
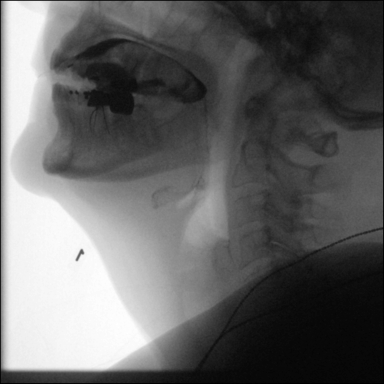
[frame 9/56]
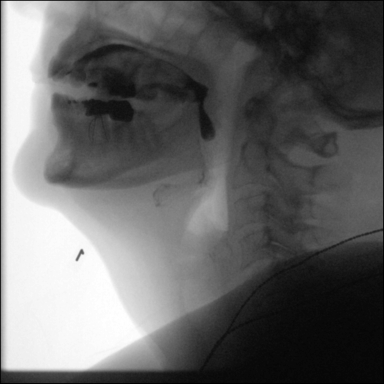
[frame 48/56]
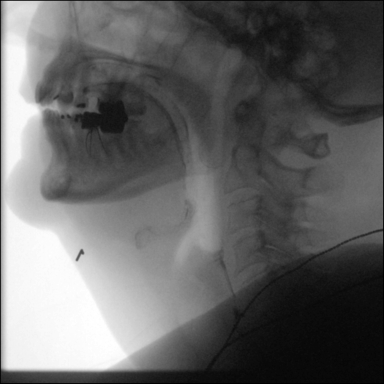

[Series 5: cp_standard · 0.34mm/px · 3 of 91 frames shown (5 of 8)]
[frame 14/91]
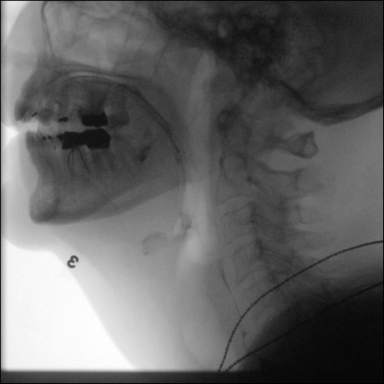
[frame 73/91]
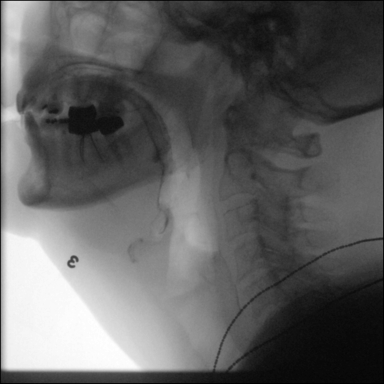
[frame 78/91]
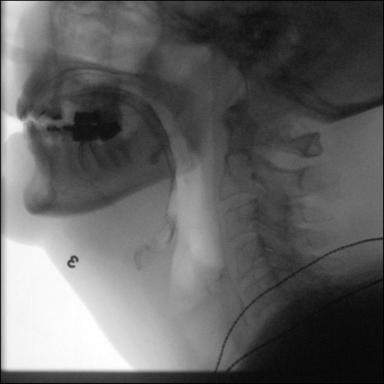

[Series 6: cp_standard · 0.34mm/px · 3 of 75 frames shown (6 of 8)]
[frame 11/75]
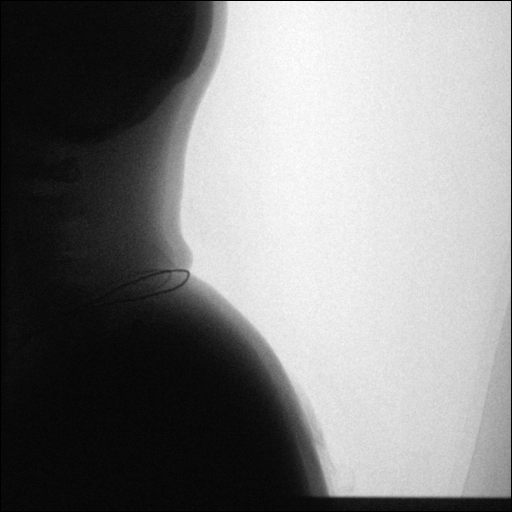
[frame 38/75]
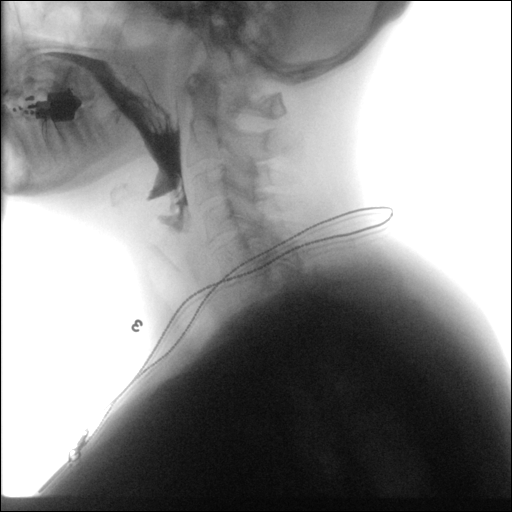
[frame 64/75]
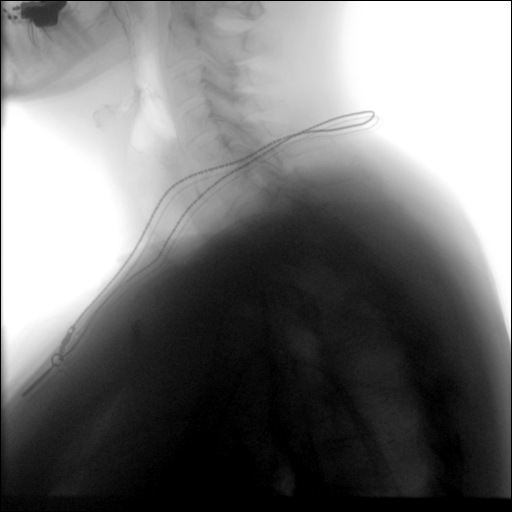

[Series 7: cp_standard · 0.34mm/px · 3 of 34 frames shown (7 of 8)]
[frame 3/34]
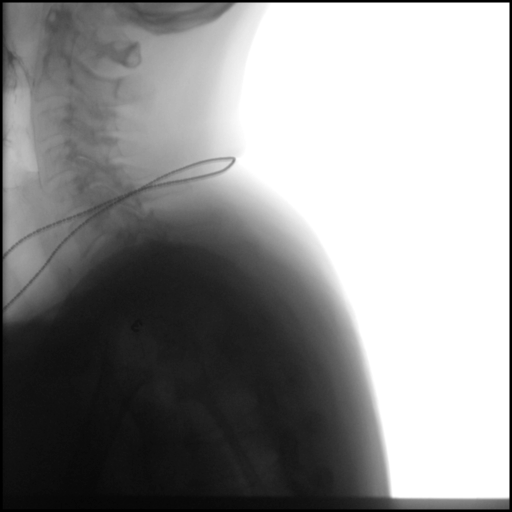
[frame 18/34]
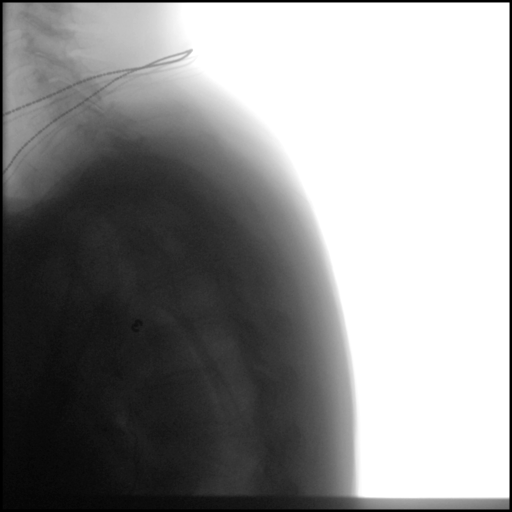
[frame 29/34]
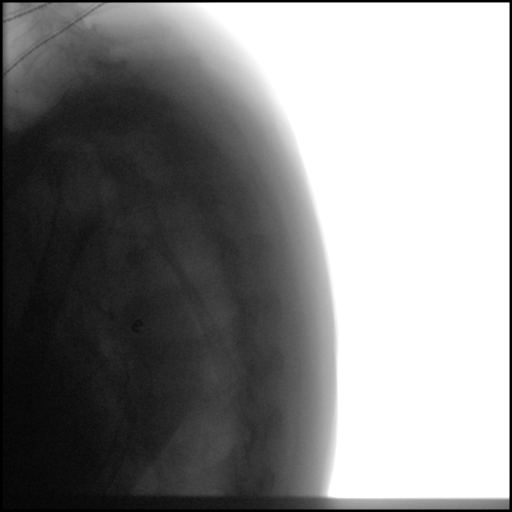

[Series 8: cp_standard · 0.34mm/px · 3 of 93 frames shown (8 of 8)]
[frame 14/93]
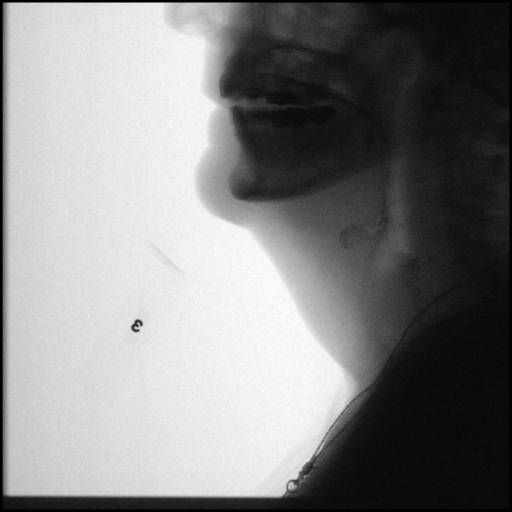
[frame 47/93]
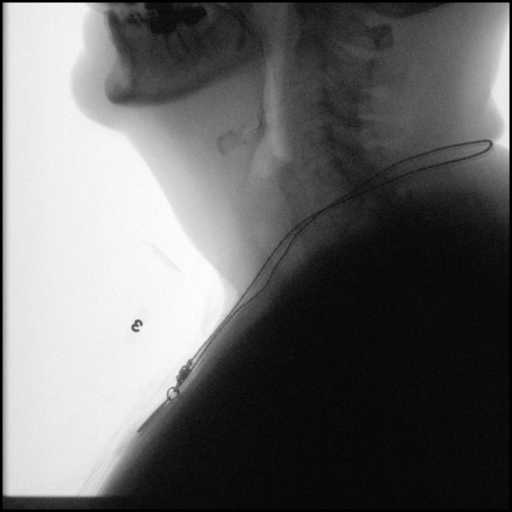
[frame 80/93]
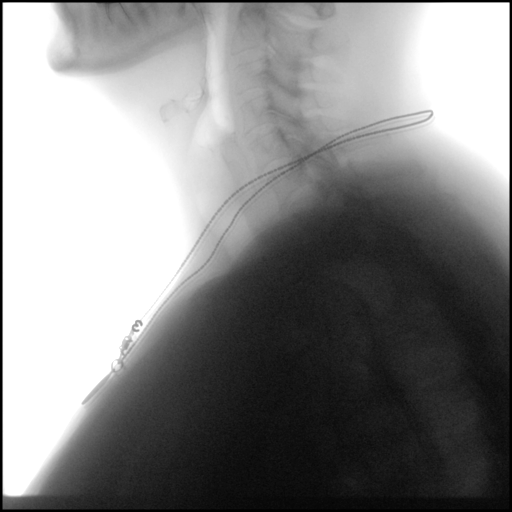

[24 of 24 positions shown; findings below may reference images not displayed]

FINDINGS: Episodes of flash penetration with thin liquids but no aspiration.
Patient did well with all the other barium substances.
IMPRESSION: Episodes of flash penetration with thin liquids but no aspiration.

No other significant findings.

Please refer to the Speech Pathologists report for complete details
and recommendations.
